# Patient Record
Sex: Male | Born: 1935 | Race: Black or African American | Hispanic: No | Marital: Single | Smoking: Former smoker
Health system: Southern US, Community
[De-identification: ages and names within clinical notes are randomized; demographics above are authoritative.]

## PROBLEM LIST (undated history)

## (undated) DIAGNOSIS — I119 Hypertensive heart disease without heart failure: Secondary | ICD-10-CM

## (undated) DIAGNOSIS — N182 Chronic kidney disease, stage 2 (mild): Secondary | ICD-10-CM

## (undated) DIAGNOSIS — E785 Hyperlipidemia, unspecified: Secondary | ICD-10-CM

## (undated) DIAGNOSIS — N390 Urinary tract infection, site not specified: Secondary | ICD-10-CM

## (undated) DIAGNOSIS — R9431 Abnormal electrocardiogram [ECG] [EKG]: Secondary | ICD-10-CM

## (undated) DIAGNOSIS — I10 Essential (primary) hypertension: Secondary | ICD-10-CM

## (undated) DIAGNOSIS — R000 Tachycardia, unspecified: Secondary | ICD-10-CM

## (undated) HISTORY — DX: Hyperlipidemia, unspecified: E78.5

## (undated) HISTORY — PX: CATARACT EXTRACTION: SUR2

## (undated) HISTORY — DX: Abnormal electrocardiogram (ECG) (EKG): R94.31

## (undated) HISTORY — DX: Hypertensive heart disease without heart failure: I11.9

## (undated) HISTORY — DX: Tachycardia, unspecified: R00.0

## (undated) HISTORY — DX: Chronic kidney disease, stage 2 (mild): N18.2

## (undated) HISTORY — DX: Essential (primary) hypertension: I10

---

## 2000-06-05 ENCOUNTER — Emergency Department (HOSPITAL_COMMUNITY): Admission: EM | Admit: 2000-06-05 | Discharge: 2000-06-05 | Payer: Self-pay | Admitting: Emergency Medicine

## 2003-12-30 ENCOUNTER — Ambulatory Visit: Payer: Self-pay | Admitting: Family Medicine

## 2004-03-20 ENCOUNTER — Ambulatory Visit: Payer: Self-pay | Admitting: Family Medicine

## 2004-11-06 ENCOUNTER — Ambulatory Visit: Payer: Self-pay | Admitting: Family Medicine

## 2004-11-07 ENCOUNTER — Encounter (INDEPENDENT_AMBULATORY_CARE_PROVIDER_SITE_OTHER): Payer: Self-pay | Admitting: Family Medicine

## 2004-11-07 LAB — CONVERTED CEMR LAB: TSH: 1.884 microintl units/mL

## 2005-05-25 ENCOUNTER — Ambulatory Visit: Payer: Self-pay | Admitting: Family Medicine

## 2005-06-17 ENCOUNTER — Ambulatory Visit: Payer: Self-pay | Admitting: Family Medicine

## 2006-05-02 ENCOUNTER — Ambulatory Visit: Payer: Self-pay | Admitting: Family Medicine

## 2007-01-24 ENCOUNTER — Encounter (INDEPENDENT_AMBULATORY_CARE_PROVIDER_SITE_OTHER): Payer: Self-pay | Admitting: Family Medicine

## 2007-01-24 DIAGNOSIS — K5900 Constipation, unspecified: Secondary | ICD-10-CM | POA: Insufficient documentation

## 2007-01-24 DIAGNOSIS — H269 Unspecified cataract: Secondary | ICD-10-CM

## 2007-01-24 DIAGNOSIS — F17200 Nicotine dependence, unspecified, uncomplicated: Secondary | ICD-10-CM

## 2007-01-24 DIAGNOSIS — I10 Essential (primary) hypertension: Secondary | ICD-10-CM | POA: Insufficient documentation

## 2007-01-24 DIAGNOSIS — H409 Unspecified glaucoma: Secondary | ICD-10-CM | POA: Insufficient documentation

## 2008-04-19 ENCOUNTER — Telehealth: Payer: Self-pay | Admitting: Internal Medicine

## 2008-04-24 ENCOUNTER — Encounter: Payer: Self-pay | Admitting: Internal Medicine

## 2008-04-29 ENCOUNTER — Telehealth (INDEPENDENT_AMBULATORY_CARE_PROVIDER_SITE_OTHER): Payer: Self-pay | Admitting: *Deleted

## 2009-07-29 ENCOUNTER — Observation Stay (HOSPITAL_COMMUNITY): Admission: EM | Admit: 2009-07-29 | Discharge: 2009-08-02 | Payer: Self-pay | Admitting: Emergency Medicine

## 2010-01-21 ENCOUNTER — Encounter: Payer: Self-pay | Admitting: Family Medicine

## 2010-01-21 ENCOUNTER — Encounter (INDEPENDENT_AMBULATORY_CARE_PROVIDER_SITE_OTHER): Payer: Self-pay | Admitting: *Deleted

## 2010-01-21 LAB — CONVERTED CEMR LAB
ALT: 10 units/L
Albumin: 3.9 g/dL
Alkaline Phosphatase: 70 units/L
Calcium: 9.7 mg/dL
Chloride, Serum: 104 mmol/L
Cholesterol: 145 mg/dL
Creatinine, Ser: 0.9 mg/dL
Free T4: 0.71 ng/dL
Globulin: 2.9 g/dL
Glucose, Bld: 194 mg/dL
Hgb A1c MFr Bld: 8.9 %
Platelets: 200 10*3/uL
Potassium, serum: 3.8 mmol/L
Sodium, serum: 138 mmol/L
Total Bilirubin: 0.2 mg/dL
Total Protein: 6.8 g/dL
Triglycerides: 52 mg/dL

## 2010-02-04 ENCOUNTER — Encounter (INDEPENDENT_AMBULATORY_CARE_PROVIDER_SITE_OTHER): Payer: Self-pay | Admitting: *Deleted

## 2010-02-04 ENCOUNTER — Encounter: Payer: Self-pay | Admitting: Family Medicine

## 2010-02-10 ENCOUNTER — Ambulatory Visit: Payer: Self-pay | Admitting: Family Medicine

## 2010-02-10 DIAGNOSIS — E785 Hyperlipidemia, unspecified: Secondary | ICD-10-CM

## 2010-02-10 DIAGNOSIS — E1169 Type 2 diabetes mellitus with other specified complication: Secondary | ICD-10-CM

## 2010-02-18 ENCOUNTER — Encounter (INDEPENDENT_AMBULATORY_CARE_PROVIDER_SITE_OTHER): Payer: Self-pay | Admitting: *Deleted

## 2010-03-03 ENCOUNTER — Telehealth (INDEPENDENT_AMBULATORY_CARE_PROVIDER_SITE_OTHER): Payer: Self-pay | Admitting: *Deleted

## 2010-04-21 NOTE — Assessment & Plan Note (Signed)
Summary: NEW EST   Vital Signs:  Patient profile:   77 year old male Height:      68 inches Weight:      206 pounds BMI:     31.44 Pulse rate:   92 / minute BP sitting:   134 / 76  (left arm)  Vitals Entered By: Malachi Bonds CMA (February 10, 2010 8:12 AM) CC: new est- fasting labs   History of Present Illness: 77 yo man here today to establish care.  Previous MD- Altheimer, had CPE earlier this year (per pt).  DM- taking Metformin, humalog 30 units bid.  pt reports he had A1C done last week, unsure of value.  CBGs labile- 90-110 fasting, 100-200s.  last eye exam was 1 year (Dr Gershon Crane).  will have occasional symptomatic lows- dizziness, shaky.  will sweat and get confused when sugars are too high.  no CP, SOB, HAs, visual changes, edema  HTN- taking lisinopril and nifedical.  asymptomatic.  has never had stress test.  Hyperlipidemia- pt was previously on meds but reports previous MD stopped these, unsure why.  R cataract- following w/ Dr Gershon Crane  Health Maintainence- reports colonoscopy at [facility] (? flex sig)  Preventive Screening-Counseling & Management  Alcohol-Tobacco     Alcohol drinks/day: 0     Smoking Status: current     Smoking Cessation Counseling: yes     Smoke Cessation Stage: precontemplative     Packs/Day: 1.0  Caffeine-Diet-Exercise     Does Patient Exercise: yes     Type of exercise: walking      Sexual History:  widowed 2011.        Drug Use:  never.    Current Medications (verified): 1)  Lisinopril 20 Mg Tabs (Lisinopril) .... Take One Tablet Daily 2)  Nifedical Xl 30 Mg Xr24h-Tab (Nifedipine) .... Take One Tablet Daily 3)  Metformin Hcl 1000 Mg Tabs (Metformin Hcl) .... Take One Tablet Two Times A Day  Allergies (verified): No Known Drug Allergies  Past History:  Past Medical History: Diabetes mellitus, type II Hypertension  Past Surgical History: none  Family History: Family History Diabetes 1st degree relative Family History  Hypertension Colon Cancer- none Prostate Cancer- none  Social History: lives w/ daughter other daughter lives locally widowed 2011 retired from [employer] Status:  current Packs/Day:  1.0 Does Patient Exercise:  yes Drug Use:  never Sexual History:  widowed 2011  Review of Systems      See HPI  Physical Exam  General:  Well-developed,well-nourished,in no acute distress; alert,appropriate and cooperative throughout examination.  smells of tobacco, overwt Head:  Normocephalic and atraumatic without obvious abnormalities Eyes:  R cataract, L PERRL EOMI Mouth:  mostly edentulous w/ exception of lower front teeth that are broken and decaying (pt reports there is a plan in place to have these pulled) Neck:  No deformities, masses, or tenderness noted. Lungs:  Normal respiratory effort, chest expands symmetrically. Lungs are clear to auscultation, no crackles or wheezes. Heart:  Normal rate and regular rhythm. S1 and S2 normal without gallop, murmur, click, rub or other extra sounds. Abdomen:  soft, NT/ND, +BS Pulses:  +2 carotid, radial, DP Extremities:  no C/C/E Skin:  turgor normal and color normal.   Cervical Nodes:  No lymphadenopathy noted Psych:  pleasant and cooperative, some difficulty w/ memory   Impression & Recommendations:  Problem # 1:  DIABETES MELLITUS, TYPE II (ICD-250.00) pt reports he had A1C done last week w/ Dr Altheimer.  will get  records.  pt on Metformin and Humalog 75/25 flex pen.  samples given of Humalog due to cost and fact pt is in donut.  will determine f/u and any med changes once records available for review. The following medications were removed from the medication list:    Enalapril Maleate 5 Mg Tabs (Enalapril maleate) ..... One qam    Starlix 120 Mg Tabs (Nateglinide) ..... One tab ac    Glucovance 5-500 Mg Tabs (Glyburide-metformin) ..... One two times a day His updated medication list for this problem includes:    Lisinopril 20 Mg  Tabs (Lisinopril) .Marland Kitchen... Take one tablet daily    Metformin Hcl 1000 Mg Tabs (Metformin hcl) .Marland Kitchen... Take one tablet two times a day  Problem # 2:  HYPERTENSION (ICD-401.9) adequate control today.  asymptomatic.  no changes at this time. The following medications were removed from the medication list:    Enalapril Maleate 5 Mg Tabs (Enalapril maleate) ..... One qam His updated medication list for this problem includes:    Lisinopril 20 Mg Tabs (Lisinopril) .Marland Kitchen... Take one tablet daily    Nifedical Xl 30 Mg Xr24h-tab (Nifedipine) .Marland Kitchen... Take one tablet daily  Problem # 3:  HYPERLIPIDEMIA (ICD-272.4) Assessment: New pt report being on Crestor previously but this was stopped.  pt unclear why.  will await records  Problem # 4:  TOBACCO ABUSE (ICD-305.1) strongly encouraged pt to quit.  will follow.  Problem # 5:  CATARACTS (ICD-366.9) following w/ Dr Gershon Crane.  Complete Medication List: 1)  Lisinopril 20 Mg Tabs (Lisinopril) .... Take one tablet daily 2)  Nifedical Xl 30 Mg Xr24h-tab (Nifedipine) .... Take one tablet daily 3)  Metformin Hcl 1000 Mg Tabs (Metformin hcl) .... Take one tablet two times a day  Patient Instructions: 1)  Once we have your records from Dr Altheimer we'll call you and set up your next appt 2)  Try and make healthy food choices and walk regularly 3)  Call with any questions or concerns 4)  Welcome!  We're glad to have you!   Orders Added: 1)  New Patient Level III [99203]     Preventive Care Screening  Colonoscopy:    Date:  03/23/2007    Results:  normal    Appended Document:  please let him know that we received his records and based on his endocrinologist report he should continue to follow with him for his Diabetes.  This appears hard to control and he is an expert in this area.  we will need to see him for a CPE in January.  please have him schedule this.

## 2010-04-21 NOTE — Letter (Signed)
Summary: [facility] Endocrinology [facility] Diabetes  [facility] Endocrinology & Diabetes   Imported By: Edmonia James 02/25/2010 14:08:44  _____________________________________________________________________  External Attachment:    Type:   Image     Comment:   External Document

## 2010-04-21 NOTE — Letter (Signed)
Summary: [facility] Endocrinology & Diabetes  [facility] Endocrinology & Diabetes   Imported By: Edmonia James 02/25/2010 14:09:58  _____________________________________________________________________  External Attachment:    Type:   Image     Comment:   External Document

## 2010-04-23 NOTE — Progress Notes (Signed)
Summary: cpx for January-lmom  Phone Note Outgoing Call   Call placed by: Malachi Bonds CMA,  March 03, 2010 3:23 PM Call placed to: Patient Summary of Call: please let him know that we received his records and based on his endocrinologist report he should continue to follow with him for his Diabetes.  This appears hard to control and he is an expert in this area.  we will need to see him for a CPE in January.  please have him schedule this.    Follow-up for Phone Call        left message on machine .........Marland KitchenMalachi Bonds CMA  March 03, 2010 3:23 PM

## 2010-04-23 NOTE — Miscellaneous (Signed)
  Clinical Lists Changes  Medications: Added new medication of NOVOLIN 70/30 70-30 % SUSP (INSULIN ISOPHANE & REGULAR) 30 units before meals and 30 units after meals Added new medication of HUMALOG MIX 75/25 75-25 % SUSP (INSULIN LISPRO PROT & LISPRO) 90 units before meals and 80 units after meals Observations: Added new observation of VIT D 25-OH: 30.2 ng/mL (02/04/2010 11:41) Added new observation of TSH: 2.66 microintl units/mL (01/21/2010 11:41) Added new observation of T4, FREE: 0.71 ng/dL (01/21/2010 11:41) Added new observation of TRIGLYC TOT: 52 mg/dL (01/21/2010 11:41) Added new observation of LDL: 87 mg/dL (01/21/2010 11:41) Added new observation of HDL: 48 mg/dL (01/21/2010 11:41) Added new observation of CHOLESTEROL: 145 mg/dL (01/21/2010 11:41) Added new observation of BILI TOTAL: 0.2 mg/dL (01/21/2010 11:41) Added new observation of ALK PHOS: 70 units/L (01/21/2010 11:41) Added new observation of SGPT (ALT): 10 units/L (01/21/2010 11:41) Added new observation of SGOT (AST): 13 units/L (01/21/2010 11:41) Added new observation of PROTEIN, TOT: 6.8 g/dL (01/21/2010 11:41) Added new observation of GLOBULIN TOT: 2.9 g/dL (01/21/2010 11:41) Added new observation of ALBUMIN: 3.9 g/dL (01/21/2010 11:41) Added new observation of CALCIUM: 9.7 mg/dL (01/21/2010 11:41) Added new observation of HGBA1C: 8.9 % (01/21/2010 11:41) Added new observation of GLUCOSE SER: 194 mg/dL (01/21/2010 11:41) Added new observation of CREATININE: 0.9 mg/dL (01/21/2010 11:41) Added new observation of BUN: 12 mg/dL (01/21/2010 11:41) Added new observation of CO2 TOTAL: 26 mmol/L (01/21/2010 11:41) Added new observation of CHLORIDE: 104 mmol/L (01/21/2010 11:41) Added new observation of POTASSIUM: 3.8 mmol/L (01/21/2010 11:41) Added new observation of SODIUM: 138 mmol/L (01/21/2010 11:41) Added new observation of PLATELETS: 200 10*3/mm3 (01/21/2010 11:41) Added new observation of MCH: 29.1 pg (01/21/2010  11:41) Added new observation of MCV: 90.3 fL (01/21/2010 11:41) Added new observation of HCT: 41.5 % (01/21/2010 11:41) Added new observation of HGB: 13.4 g/dL (01/21/2010 11:41) Added new observation of RBC: 4.60 10*6/mm3 (01/21/2010 11:41) Added new observation of WBC: 6.0 10*3/mm3 (01/21/2010 11:41)  Appended Document:  please let him know that we received his records and based on his endocrinologist report he should continue to follow with him for his Diabetes.  This appears hard to control and he is an expert in this area.  we will need to see him for a CPE in January.  please have him schedule this.  Appended Document:  see phone note

## 2010-06-09 LAB — GLUCOSE, CAPILLARY
Glucose-Capillary: 103 mg/dL — ABNORMAL HIGH (ref 70–99)
Glucose-Capillary: 113 mg/dL — ABNORMAL HIGH (ref 70–99)
Glucose-Capillary: 120 mg/dL — ABNORMAL HIGH (ref 70–99)
Glucose-Capillary: 123 mg/dL — ABNORMAL HIGH (ref 70–99)
Glucose-Capillary: 126 mg/dL — ABNORMAL HIGH (ref 70–99)
Glucose-Capillary: 127 mg/dL — ABNORMAL HIGH (ref 70–99)
Glucose-Capillary: 148 mg/dL — ABNORMAL HIGH (ref 70–99)
Glucose-Capillary: 149 mg/dL — ABNORMAL HIGH (ref 70–99)
Glucose-Capillary: 161 mg/dL — ABNORMAL HIGH (ref 70–99)
Glucose-Capillary: 162 mg/dL — ABNORMAL HIGH (ref 70–99)
Glucose-Capillary: 171 mg/dL — ABNORMAL HIGH (ref 70–99)
Glucose-Capillary: 171 mg/dL — ABNORMAL HIGH (ref 70–99)
Glucose-Capillary: 181 mg/dL — ABNORMAL HIGH (ref 70–99)
Glucose-Capillary: 213 mg/dL — ABNORMAL HIGH (ref 70–99)
Glucose-Capillary: 38 mg/dL — CL (ref 70–99)
Glucose-Capillary: 38 mg/dL — CL (ref 70–99)
Glucose-Capillary: 44 mg/dL — CL (ref 70–99)
Glucose-Capillary: 54 mg/dL — ABNORMAL LOW (ref 70–99)
Glucose-Capillary: 56 mg/dL — ABNORMAL LOW (ref 70–99)
Glucose-Capillary: 67 mg/dL — ABNORMAL LOW (ref 70–99)
Glucose-Capillary: 72 mg/dL (ref 70–99)
Glucose-Capillary: 76 mg/dL (ref 70–99)
Glucose-Capillary: 78 mg/dL (ref 70–99)
Glucose-Capillary: 84 mg/dL (ref 70–99)
Glucose-Capillary: 89 mg/dL (ref 70–99)

## 2010-06-09 LAB — URINALYSIS, ROUTINE W REFLEX MICROSCOPIC
Glucose, UA: 500 mg/dL — AB
Ketones, ur: 15 mg/dL — AB
Leukocytes, UA: NEGATIVE
Nitrite: NEGATIVE
Protein, ur: >300 mg/dL — AB
Specific Gravity, Urine: 1.04 — ABNORMAL HIGH (ref 1.005–1.030)
Urobilinogen, UA: 1 mg/dL (ref 0.0–1.0)
pH: 5 (ref 5.0–8.0)

## 2010-06-09 LAB — BASIC METABOLIC PANEL
BUN: 12 mg/dL (ref 6–23)
BUN: 9 mg/dL (ref 6–23)
CO2: 25 mEq/L (ref 19–32)
CO2: 25 mEq/L (ref 19–32)
Calcium: 8.6 mg/dL (ref 8.4–10.5)
Calcium: 8.8 mg/dL (ref 8.4–10.5)
Calcium: 8.9 mg/dL (ref 8.4–10.5)
Chloride: 106 mEq/L (ref 96–112)
Chloride: 108 mEq/L (ref 96–112)
Creatinine, Ser: 1.02 mg/dL (ref 0.4–1.5)
Creatinine, Ser: 1.03 mg/dL (ref 0.4–1.5)
Creatinine, Ser: 1.09 mg/dL (ref 0.4–1.5)
GFR calc Af Amer: >60 mL/min (ref >60)
GFR calc non Af Amer: >60 mL/min (ref >60)
GFR calc non Af Amer: >60 mL/min (ref >60)
Glucose, Bld: 257 mg/dL — ABNORMAL HIGH (ref 70–99)
Potassium: 3.5 mEq/L (ref 3.5–5.1)
Potassium: 4.9 mEq/L (ref 3.5–5.1)
Sodium: 140 mEq/L (ref 135–145)

## 2010-06-09 LAB — DIFFERENTIAL
Basophils Absolute: 0 10*3/uL (ref 0.0–0.1)
Basophils Relative: 0 % (ref 0–1)
Eosinophils Absolute: 0 10*3/uL (ref 0.0–0.7)
Eosinophils Relative: 0 % (ref 0–5)
Lymphocytes Relative: 8 % — ABNORMAL LOW (ref 12–46)
Lymphs Abs: 1 10*3/uL (ref 0.7–4.0)
Monocytes Absolute: 0.6 10*3/uL (ref 0.1–1.0)
Monocytes Relative: 5 % (ref 3–12)
Neutro Abs: 11 10*3/uL — ABNORMAL HIGH (ref 1.7–7.7)
Neutrophils Relative %: 87 % — ABNORMAL HIGH (ref 43–77)

## 2010-06-09 LAB — URINE MICROSCOPIC-ADD ON

## 2010-06-09 LAB — HEMOGLOBIN A1C
Hgb A1c MFr Bld: 9.6 % — ABNORMAL HIGH
Mean Plasma Glucose: 229 mg/dL — ABNORMAL HIGH

## 2010-06-09 LAB — CK
Total CK: 1242 U/L — ABNORMAL HIGH (ref 7–232)
Total CK: 4618 U/L — ABNORMAL HIGH (ref 7–232)

## 2010-06-09 LAB — CK TOTAL AND CKMB (NOT AT ARMC)
CK, MB: 71.3 ng/mL (ref 0.3–4.0)
Relative Index: 1.4 (ref 0.0–2.5)
Relative Index: 1.6 (ref 0.0–2.5)
Relative Index: 1.6 (ref 0.0–2.5)
Total CK: 1395 U/L — ABNORMAL HIGH (ref 7–232)
Total CK: 4366 U/L — ABNORMAL HIGH (ref 7–232)

## 2010-06-09 LAB — BASIC METABOLIC PANEL WITH GFR
BUN: 6 mg/dL (ref 6–23)
BUN: 7 mg/dL (ref 6–23)
CO2: 24 meq/L (ref 19–32)
CO2: 28 meq/L (ref 19–32)
Calcium: 8.8 mg/dL (ref 8.4–10.5)
Calcium: 9.1 mg/dL (ref 8.4–10.5)
Chloride: 107 meq/L (ref 96–112)
Chloride: 113 meq/L — ABNORMAL HIGH (ref 96–112)
Creatinine, Ser: 0.97 mg/dL (ref 0.4–1.5)
Creatinine, Ser: 1.1 mg/dL (ref 0.4–1.5)
GFR calc non Af Amer: >60 mL/min
GFR calc non Af Amer: >60 mL/min
Glucose, Bld: 132 mg/dL — ABNORMAL HIGH (ref 70–99)
Glucose, Bld: 42 mg/dL — CL (ref 70–99)
Potassium: 3.4 meq/L — ABNORMAL LOW (ref 3.5–5.1)
Potassium: 3.7 meq/L (ref 3.5–5.1)
Sodium: 141 meq/L (ref 135–145)
Sodium: 143 meq/L (ref 135–145)

## 2010-06-09 LAB — RAPID URINE DRUG SCREEN, HOSP PERFORMED
Amphetamines: NOT DETECTED
Barbiturates: NOT DETECTED
Benzodiazepines: NOT DETECTED
Cocaine: NOT DETECTED
Opiates: NOT DETECTED
Tetrahydrocannabinol: NOT DETECTED

## 2010-06-09 LAB — CBC
HCT: 34.6 % — ABNORMAL LOW (ref 39.0–52.0)
MCHC: 34.1 g/dL (ref 30.0–36.0)
Platelets: 172 10*3/uL (ref 150–400)
WBC: 11.9 10*3/uL — ABNORMAL HIGH (ref 4.0–10.5)

## 2010-06-09 LAB — POCT CARDIAC MARKERS
CKMB, poc: 13.8 ng/mL (ref 1.0–8.0)
Myoglobin, poc: >500 ng/mL (ref 12–200)
Troponin i, poc: <0.05 ng/mL (ref 0.00–0.09)

## 2010-06-09 LAB — HEPATIC FUNCTION PANEL
ALT: 28 U/L (ref 0–53)
AST: 67 U/L — ABNORMAL HIGH (ref 0–37)
Total Protein: 6.9 g/dL (ref 6.0–8.3)

## 2010-06-09 LAB — TROPONIN I: Troponin I: 0.01 ng/mL (ref 0.00–0.06)

## 2010-06-25 ENCOUNTER — Telehealth: Payer: Self-pay | Admitting: *Deleted

## 2010-06-25 ENCOUNTER — Encounter: Payer: Self-pay | Admitting: *Deleted

## 2010-06-25 MED ORDER — INSULIN NPH ISOPHANE & REGULAR (70-30) 100 UNIT/ML ~~LOC~~ SUSP: SUBCUTANEOUS | Status: DC

## 2010-06-25 NOTE — Telephone Encounter (Signed)
Left message to call office, Rx sent to pharmacy and letter mailed advising Pt to contact office.

## 2010-06-25 NOTE — Telephone Encounter (Signed)
We can give pt 1 month of meds but he needs to get his diabetic care from his endocrinologist.  He also never scheduled his physical exam.  Please let him know that he needs to make both appts.

## 2010-06-25 NOTE — Telephone Encounter (Signed)
Last OV 02-10-10 new to establish, per phone note dated 03-03-10 per endocrinologist report he should continue to follow with him for his Diabetes.  This appears hard to control and he is an expert in this area.  we will need to see him for a CPE in January.  Pt has no pending appt.

## 2010-06-29 NOTE — Telephone Encounter (Signed)
Left message to call office

## 2010-06-30 NOTE — Telephone Encounter (Signed)
Left message to call office with child.

## 2010-07-01 NOTE — Telephone Encounter (Signed)
Pt aware.

## 2010-10-13 ENCOUNTER — Telehealth: Payer: Self-pay | Admitting: *Deleted

## 2010-10-13 NOTE — Telephone Encounter (Signed)
MD received paperwork from [employer] for DM supplies. It was completed and faxed with notation the pt needs DM follow up appt. Called pt to schedule and left message with family to call the office.

## 2010-10-16 NOTE — Telephone Encounter (Signed)
No return call from pt. I called and pt wife put him on the phone and pt would not respond after I introduced myself. Mailed letter per MD.

## 2010-12-15 ENCOUNTER — Telehealth: Payer: Self-pay | Admitting: *Deleted

## 2010-12-15 NOTE — Telephone Encounter (Signed)
Will not complete any forms until pt is seen.  He has had multiple phone calls and letters indicating he needs to be seen.  Please try and contact pt again- if we are again unable to reach him or he refuses to schedule will begin the dismissal process.

## 2010-12-15 NOTE — Telephone Encounter (Signed)
Call from [place] at [facility] stating she is faxing form for pt to receive diabetic shoes.  States that she has previously faxed this form and left several messages, but no one has returned her call.  Pt has not been seen since 02/10/10 and was told by letter in July he is due for follow up.  Geni Bers states she will refax form. Please advise.

## 2010-12-15 NOTE — Telephone Encounter (Signed)
I have attempted to contact this patient by phone with the following results: message left to return my call with an older gentleman at home number.

## 2010-12-16 NOTE — Telephone Encounter (Signed)
2nd attempt to contact patient.  Phone answered by older sounding gentleman.  I asked to speak with "Marcus Weaver" and was told he would be in in about 30 more minutes.  I asked if I could leave a message for Marcus Weaver and the gentleman hung up the phone.

## 2010-12-28 ENCOUNTER — Telehealth: Payer: Self-pay

## 2010-12-28 NOTE — Telephone Encounter (Signed)
Pt aware diabetic supplies documents will not be completed until follow up appointment is scheduled.  Appointment scheduled for Thursday, December 31, 2010

## 2010-12-30 ENCOUNTER — Encounter: Payer: Self-pay | Admitting: Family Medicine

## 2010-12-30 ENCOUNTER — Telehealth: Payer: Self-pay

## 2010-12-30 NOTE — Telephone Encounter (Signed)
Abigail Butts at [facility] called to check status of pt's order for diabetic shoes

## 2010-12-31 ENCOUNTER — Ambulatory Visit (INDEPENDENT_AMBULATORY_CARE_PROVIDER_SITE_OTHER): Payer: PRIVATE HEALTH INSURANCE | Admitting: Family Medicine

## 2010-12-31 ENCOUNTER — Encounter: Payer: Self-pay | Admitting: Family Medicine

## 2010-12-31 DIAGNOSIS — E785 Hyperlipidemia, unspecified: Secondary | ICD-10-CM

## 2010-12-31 DIAGNOSIS — I10 Essential (primary) hypertension: Secondary | ICD-10-CM

## 2010-12-31 DIAGNOSIS — E119 Type 2 diabetes mellitus without complications: Secondary | ICD-10-CM

## 2010-12-31 LAB — LIPID PANEL
HDL: 45.2 mg/dL (ref >39.00)
Total CHOL/HDL Ratio: 3

## 2010-12-31 LAB — HEMOGLOBIN A1C: Hgb A1c MFr Bld: 9.8 % — ABNORMAL HIGH (ref 4.6–6.5)

## 2010-12-31 LAB — CBC WITH DIFFERENTIAL/PLATELET
Basophils Relative: 0.3 % (ref 0.0–3.0)
Eosinophils Relative: 2.9 % (ref 0.0–5.0)
Lymphocytes Relative: 32.2 % (ref 12.0–46.0)
MCV: 91.6 fl (ref 78.0–100.0)
Monocytes Relative: 8.6 % (ref 3.0–12.0)
Neutrophils Relative %: 56 % (ref 43.0–77.0)
Platelets: 177 10*3/uL (ref 150.0–400.0)
RBC: 4.49 Mil/uL (ref 4.22–5.81)
WBC: 7.9 10*3/uL (ref 4.5–10.5)

## 2010-12-31 LAB — BASIC METABOLIC PANEL
BUN: 18 mg/dL (ref 6–23)
CO2: 28 mEq/L (ref 19–32)
Chloride: 110 mEq/L (ref 96–112)
GFR: 80.58 mL/min (ref >60.00)
Glucose, Bld: 64 mg/dL — ABNORMAL LOW (ref 70–99)
Potassium: 4.1 mEq/L (ref 3.5–5.1)
Sodium: 144 mEq/L (ref 135–145)

## 2010-12-31 LAB — HEPATIC FUNCTION PANEL
ALT: 10 U/L (ref 0–53)
AST: 13 U/L (ref 0–37)
Albumin: 3.9 g/dL (ref 3.5–5.2)
Alkaline Phosphatase: 65 U/L (ref 39–117)

## 2010-12-31 LAB — TSH: TSH: 1.42 u[IU]/mL (ref 0.35–5.50)

## 2010-12-31 MED ORDER — INSULIN LISPRO PROT & LISPRO (75-25 MIX) 100 UNIT/ML ~~LOC~~ SUSP: 40.0000 [IU] | Freq: Two times a day (BID) | SUBCUTANEOUS | Status: DC

## 2010-12-31 NOTE — Progress Notes (Signed)
Quick Note:  Pt unavailable and unable to leave message ______

## 2010-12-31 NOTE — Telephone Encounter (Signed)
Pt came for appt today.

## 2010-12-31 NOTE — Progress Notes (Signed)
  Subjective:    Patient ID: Marcus Weaver, male    DOB: [birth year], 77 y.o.   MRN: [id number]  HPI DM- chronic problem for pt, has not been seen since Nov of last year.  Reports CBGs are labile, ranging from 90s-190s.  Taking Metformin 1000mg  bid.  Taking Humalog bid- 30 units.  Denies recent symptomatic lows.  No CP, SOB, HAs, edema.  UTD on eye exam.  Has upcoming cataract surgery on 10/29.  HTN- chronic problem, on Lisinopril, procardia.  Asymptomatic.  Hyperlipidemia- chronic problem for pt, was previously on Crestor but ran out of meds and never got these refilled.  Review of Systems For ROS see HPI     Objective:   Physical Exam  Vitals reviewed. Constitutional: He is oriented to person, place, and time. He appears well-developed and well-nourished. No distress.  HENT:  Head: Normocephalic and atraumatic.  Eyes: Conjunctivae and EOM are normal. Pupils are equal, round, and reactive to light.  Neck: Normal range of motion. Neck supple. No thyromegaly present.  Cardiovascular: Normal rate, regular rhythm, normal heart sounds and intact distal pulses.   No murmur heard. Pulmonary/Chest: Effort normal and breath sounds normal. No respiratory distress.  Abdominal: Soft. Bowel sounds are normal. He exhibits no distension.  Musculoskeletal: He exhibits no edema.  Lymphadenopathy:    He has no cervical adenopathy.  Neurological: He is alert and oriented to person, place, and time. No cranial nerve deficit.  Skin: Skin is warm and dry.  Psychiatric: He has a normal mood and affect. His behavior is normal.         Assessment & Plan:

## 2010-12-31 NOTE — Patient Instructions (Signed)
Follow up in 3 months to recheck your diabetes We'll notify you of your lab results and make any med changes if needed [facility] will contact you about your shoes Call with any questions or concerns Hang in there!

## 2011-01-04 ENCOUNTER — Telehealth: Payer: Self-pay | Admitting: *Deleted

## 2011-01-04 NOTE — Telephone Encounter (Signed)
Paperwork faxed °

## 2011-01-04 NOTE — Progress Notes (Signed)
Quick Note:  Left message for patient to call back. ______

## 2011-01-04 NOTE — Telephone Encounter (Signed)
Prior Auth approved 01-04-11 until 01-03-14, Pharmacy notified via fax.

## 2011-01-04 NOTE — Progress Notes (Signed)
Quick Note:  Attempted to reach pt to no avail. Will mail labs and try to again later ______

## 2011-01-05 NOTE — Assessment & Plan Note (Signed)
Pt has been very difficult to get in touch with and has not follow up as recommended.  He and daughter are confused by his regimen.  Fear that his diabetes is not well controlled.  UTD on eye exam, had foot exam done today.  Check labs.  Adjust meds prn.

## 2011-01-05 NOTE — Assessment & Plan Note (Signed)
Chronic problem.  Adequate control today.  Asymptomatic.  No med changes at this time 

## 2011-01-05 NOTE — Assessment & Plan Note (Signed)
Chronic problem for pt.  LDL goal is 70 due to DM.  Was previously on statin.  Stopped b/c he ran out of meds.  Check labs and restart meds.

## 2011-02-16 ENCOUNTER — Other Ambulatory Visit: Payer: Self-pay | Admitting: Family Medicine

## 2011-02-16 MED ORDER — NIFEDIPINE ER 30 MG PO TB24: 30.0000 mg | ORAL_TABLET | Freq: Every day | ORAL | Status: DC

## 2011-02-16 MED ORDER — LISINOPRIL 20 MG PO TABS: 20.0000 mg | ORAL_TABLET | Freq: Every day | ORAL | Status: DC

## 2011-02-16 NOTE — Telephone Encounter (Signed)
rx sent to pharmacy by e-script  

## 2011-06-23 ENCOUNTER — Other Ambulatory Visit: Payer: Self-pay | Admitting: Family Medicine

## 2011-06-23 NOTE — Telephone Encounter (Signed)
Refill for 90-day  1-lisinopril 20mg  tablets, take 1-tablet by mouth daily Last written 11.27.13  2-Nifediac CC 30MG  ER Tablets, Take 1-tablet by mouth daily  Last written 11.27.13

## 2011-06-24 MED ORDER — LISINOPRIL 20 MG PO TABS: 20.0000 mg | ORAL_TABLET | Freq: Every day | ORAL | Status: DC

## 2011-06-24 MED ORDER — NIFEDIPINE ER 30 MG PO TB24: 30.0000 mg | ORAL_TABLET | Freq: Every day | ORAL | Status: DC

## 2011-06-24 NOTE — Telephone Encounter (Signed)
rx sent to pharmacy by e-script for #30 with 1 refill Letter has been mailed to pt address noted in the chart to advise they are overdue for cpe/ov/labs and the pt needs to contact office to set up appt   

## 2011-10-25 ENCOUNTER — Telehealth: Payer: Self-pay | Admitting: Family Medicine

## 2011-10-25 MED ORDER — LISINOPRIL 20 MG PO TABS: 20.0000 mg | ORAL_TABLET | Freq: Every day | ORAL | Status: DC

## 2011-10-25 MED ORDER — NIFEDIPINE ER 30 MG PO TB24: 30.0000 mg | ORAL_TABLET | Freq: Every day | ORAL | Status: DC

## 2011-10-25 NOTE — Telephone Encounter (Signed)
Noted. Agree w/ appt.

## 2011-10-25 NOTE — Telephone Encounter (Signed)
FYI: noted pt was advised on 10-12 to come back in 3 months to re-check DM and Cholesterol, pt has not scheduled apt, left vm for pt to call office to schedule F/U apt asap, sent #30 with no refills on both rx requested via escribe

## 2011-10-25 NOTE — Telephone Encounter (Signed)
Refill: Nifedipine 20mg  er tablets. Take 1 tablet by mouth daily. Qty 30. Last fill 7.3.13 Lisinopril 20mg  tablets. Take 1 tablet by mouth daily. Qty 30. Last fill 7.3.13

## 2011-11-05 ENCOUNTER — Encounter: Payer: Self-pay | Admitting: Family Medicine

## 2011-11-05 ENCOUNTER — Ambulatory Visit (INDEPENDENT_AMBULATORY_CARE_PROVIDER_SITE_OTHER): Payer: PRIVATE HEALTH INSURANCE | Admitting: Family Medicine

## 2011-11-05 VITALS — BP 140/76 | HR 90 | Temp 98.3°F | Ht 67.5 in | Wt 197.2 lb

## 2011-11-05 DIAGNOSIS — M109 Gout, unspecified: Secondary | ICD-10-CM

## 2011-11-05 DIAGNOSIS — E785 Hyperlipidemia, unspecified: Secondary | ICD-10-CM

## 2011-11-05 DIAGNOSIS — E119 Type 2 diabetes mellitus without complications: Secondary | ICD-10-CM

## 2011-11-05 DIAGNOSIS — I10 Essential (primary) hypertension: Secondary | ICD-10-CM

## 2011-11-05 DIAGNOSIS — Z0000 Encounter for general adult medical examination without abnormal findings: Secondary | ICD-10-CM

## 2011-11-05 LAB — LIPID PANEL
Cholesterol: 153 mg/dL (ref 0–200)
HDL: 41.5 mg/dL (ref >39.00)
Triglycerides: 69 mg/dL (ref 0.0–149.0)
VLDL: 13.8 mg/dL (ref 0.0–40.0)

## 2011-11-05 LAB — CBC WITH DIFFERENTIAL/PLATELET
Basophils Absolute: 0 10*3/uL (ref 0.0–0.1)
Eosinophils Relative: 1.9 % (ref 0.0–5.0)
HCT: 43.2 % (ref 39.0–52.0)
Hemoglobin: 14.1 g/dL (ref 13.0–17.0)
Lymphocytes Relative: 18.8 % (ref 12.0–46.0)
Monocytes Relative: 6 % (ref 3.0–12.0)
Neutro Abs: 5.8 10*3/uL (ref 1.4–7.7)
Platelets: 175 10*3/uL (ref 150.0–400.0)
RDW: 13.4 % (ref 11.5–14.6)
WBC: 8 10*3/uL (ref 4.5–10.5)

## 2011-11-05 LAB — HEPATIC FUNCTION PANEL
AST: 12 U/L (ref 0–37)
Total Bilirubin: 0.6 mg/dL (ref 0.3–1.2)

## 2011-11-05 LAB — BASIC METABOLIC PANEL
BUN: 9 mg/dL (ref 6–23)
CO2: 24 mEq/L (ref 19–32)
Calcium: 9.5 mg/dL (ref 8.4–10.5)
Chloride: 106 mEq/L (ref 96–112)
Creatinine, Ser: 1 mg/dL (ref 0.4–1.5)
GFR: 97.05 mL/min (ref >60.00)
Sodium: 140 mEq/L (ref 135–145)

## 2011-11-05 LAB — TSH: TSH: 1.69 u[IU]/mL (ref 0.35–5.50)

## 2011-11-05 NOTE — Progress Notes (Signed)
  Subjective:    Patient ID: Marcus Weaver, male    DOB: 02/25/1931, 78 y.o.   MRN: [id number]  HPI Here today for CPE.  Risk Factors: DM- chronic problem, pt was advised to f/u 9 months ago but this did not happen.  On Metformin, Humalog 40 units QAM, and 30 units QPM.  UTD on eye exams, attempting to get cataract removed.  CBGs typically 110-120s.  No neuropathy.  No CP, SOB, HAs, edema, N/V/D, dizziness.  HTN-  Chronic problem, adequate control.  On Lisinopril, Nifedipine.  Asymptomatic.  Physical Activity: walking sporadically Fall Risk: low risk Depression: denies current sxs Hearing: normal to conversational tones, decreased to whispered voice ADL's: independent Cognitive: normal linear thought process, decreased memory, good attention Home Safety: safe at home Height, Weight, BMI, Visual Acuity: see vitals, vision corrected to 20/20 w/ glasses Counseling: UTD on eye exam, UTD on colonoscopy [place]) Labs Ordered: See A&P Care Plan: See A&P    Review of Systems For ROS see HPI     Objective:   Physical Exam BP 140/76  Pulse 90  Temp 98.3 F (36.8 C) (Oral)  Ht 5' 7.5" (1.715 m)  Wt 197 lb 3.2 oz (89.449 kg)  BMI 30.43 kg/m2  SpO2 96%  General Appearance:    Alert, cooperative, no distress, appears stated age  Head:    Normocephalic, without obvious abnormality, atraumatic  Eyes:    PERRL, conjunctiva/corneas clear, EOM's intact  Ears:    Normal TM's and external ear canals, both ears  Nose:   Nares normal, septum midline, mucosa normal, no drainage   or sinus tenderness  Throat:   Lips, mucosa, and tongue normal; teeth and gums normal  Neck:   Supple, symmetrical, trachea midline, no adenopathy;       thyroid:  No enlargement/tenderness/nodules  Back:     Symmetric, no curvature, ROM normal, no CVA tenderness  Lungs:     Clear to auscultation bilaterally, respirations unlabored  Chest wall:    No tenderness or deformity  Heart:    Regular rate and rhythm, S1 and  S2 normal, no murmur, rub   or gallop  Abdomen:     Soft, non-tender, bowel sounds active all four quadrants,    no masses, no organomegaly  Genitalia:    Normal male without lesion, discharge or tenderness  Rectal:    Normal tone, normal prostate, no masses or tenderness  Extremities:   Extremities normal, atraumatic, no cyanosis or edema  Pulses:   2+ and symmetric all extremities  Skin:   Skin color, texture, turgor normal, no rashes or lesions  Lymph nodes:   Cervical, supraclavicular, and axillary nodes normal  Neurologic:   CNII-XII intact. Normal strength, sensation and reflexes      throughout          Assessment & Plan:

## 2011-11-05 NOTE — Patient Instructions (Addendum)
Follow up in 3 months to recheck diabetes We'll notify you of your lab results and make any changes if needed Call with any questions or concerns Hang in there!

## 2011-11-14 NOTE — Assessment & Plan Note (Signed)
Pt's PE WNL.  UTD on health maintenance.  Check labs.  Anticipatory guidance provided.  

## 2011-11-14 NOTE — Assessment & Plan Note (Signed)
Pt w/ hx of this.  Not currently on meds.  Check labs.  Start meds prn.

## 2011-11-14 NOTE — Assessment & Plan Note (Signed)
Chronic problem.  Adequate control.  Asymptomatic.  No changes.

## 2011-11-14 NOTE — Assessment & Plan Note (Signed)
Chronic problem.  Pt is not following up as directed and will go months w/out A1Cs or blood sugar readings.  Denies symptomatic lows, UTD on eye exam.  Foot exam done today.  Check labs.  Adjust meds as able but if A1C remains poorly controlled will need endo referral.  Pt expressed understanding and is in agreement w/ plan.

## 2011-11-24 ENCOUNTER — Telehealth: Payer: Self-pay | Admitting: Family Medicine

## 2011-11-24 NOTE — Telephone Encounter (Signed)
Just fyi.....Marland Kitchen  In reference to Endocrinology referral, patient had history with Dr. Elyse Hsu, so I referred him there.  Per phone call received yesterday, 11/23/11, from [place] at [place] [facility], they will not see this patient.  He was dismissed for being non compliant, and has an old balance that was never taken care of.  I have referred him to Dr. Loanne Drilling, and he is aware of all above.

## 2011-11-24 NOTE — Telephone Encounter (Signed)
Noted thank you

## 2011-12-06 ENCOUNTER — Ambulatory Visit: Payer: PRIVATE HEALTH INSURANCE | Admitting: Endocrinology

## 2011-12-16 ENCOUNTER — Other Ambulatory Visit: Payer: Self-pay | Admitting: Family Medicine

## 2011-12-16 ENCOUNTER — Telehealth: Payer: Self-pay | Admitting: Family Medicine

## 2011-12-16 MED ORDER — NIFEDIPINE ER 30 MG PO TB24: 30.0000 mg | ORAL_TABLET | Freq: Every day | ORAL | Status: DC

## 2011-12-16 MED ORDER — LISINOPRIL 20 MG PO TABS: 20.0000 mg | ORAL_TABLET | Freq: Every day | ORAL | Status: DC

## 2011-12-16 NOTE — Telephone Encounter (Signed)
rx sent to pharmacy by e-script  

## 2011-12-16 NOTE — Telephone Encounter (Signed)
refill Lisinopril (Tab) 20 MG Take 1 tablet (20 mg total) by mouth daily  last fill 8.5.13 #30 last ov 8.16.13 V70

## 2011-12-16 NOTE — Telephone Encounter (Signed)
Refill: Nifedipine 30mg  er (cc) tablets. Take 1 tablet by mouth daily. Qty 30. Last fill 8.5.13

## 2011-12-22 ENCOUNTER — Other Ambulatory Visit: Payer: Self-pay | Admitting: Family Medicine

## 2011-12-22 NOTE — Telephone Encounter (Signed)
recv 2nd req for Nifediopine 30mg  ER CC tabs sent on 9.26.13 can you re-send please

## 2011-12-23 MED ORDER — NIFEDIPINE ER 30 MG PO TB24: 30.0000 mg | ORAL_TABLET | Freq: Every day | ORAL | Status: DC

## 2011-12-23 NOTE — Telephone Encounter (Signed)
rx sent to pharmacy by e-script  

## 2012-02-23 ENCOUNTER — Other Ambulatory Visit: Payer: Self-pay | Admitting: Family Medicine

## 2012-02-23 DIAGNOSIS — E119 Type 2 diabetes mellitus without complications: Secondary | ICD-10-CM

## 2012-02-23 MED ORDER — INSULIN LISPRO PROT & LISPRO (75-25 MIX) 100 UNIT/ML ~~LOC~~ SUSP: 40.0000 [IU] | Freq: Two times a day (BID) | SUBCUTANEOUS | Status: DC

## 2012-02-23 NOTE — Telephone Encounter (Signed)
refill Humalog MIX 75/25 KWIKPEN INJ 5x3ML inject 40 units into the skin twice daily with a meal #15 wt/4-refills no last fill listed last ov wt/labs 8.16.13 V70

## 2012-02-24 MED ORDER — INSULIN LISPRO PROT & LISPRO (75-25 MIX) 100 UNIT/ML ~~LOC~~ SUSP: SUBCUTANEOUS | Status: DC

## 2012-02-24 MED ORDER — INSULIN PEN NEEDLE 32G X 8 MM MISC: Status: DC

## 2012-02-24 NOTE — Addendum Note (Signed)
Addended by: Valentina Gu L on: [id number] 03:50 PM   Modules accepted: Orders

## 2012-02-24 NOTE — Telephone Encounter (Signed)
Per fax received from pharmacy: Pt states that he is currently using pen, not the vials. Can we please send in new Rx for the Kwikpens if this is correct? He's also stating he needs a rx for more pen needles. Rx sent

## 2012-04-14 ENCOUNTER — Other Ambulatory Visit: Payer: Self-pay | Admitting: Family Medicine

## 2012-05-25 ENCOUNTER — Telehealth: Payer: Self-pay | Admitting: Family Medicine

## 2012-05-25 MED ORDER — LISINOPRIL 20 MG PO TABS: ORAL_TABLET | ORAL | Status: DC

## 2012-05-25 NOTE — Telephone Encounter (Signed)
refill Lisinopril (Tab) 20 MG TAKE 1 TABLET BY MOUTH DAILY #30 last fill 1.24.14

## 2012-05-25 NOTE — Telephone Encounter (Signed)
Refill done.  

## 2012-06-15 ENCOUNTER — Telehealth: Payer: Self-pay | Admitting: Family Medicine

## 2012-06-15 NOTE — Telephone Encounter (Signed)
REFILLS NEEDED ON NIFEDICAL #30  [id number] 1 TABLET BY MOUTH DAILY LAST FILLED :02.25.2014

## 2012-06-16 MED ORDER — NIFEDIPINE ER 30 MG PO TB24: 30.0000 mg | ORAL_TABLET | Freq: Every day | ORAL | Status: DC

## 2012-06-16 NOTE — Telephone Encounter (Signed)
Rx sent to the pharmacy by e-script.//AB/CMA 

## 2013-05-20 LAB — HM DIABETES EYE EXAM

## 2013-06-01 ENCOUNTER — Encounter: Payer: Self-pay | Admitting: Family Medicine

## 2013-06-01 ENCOUNTER — Ambulatory Visit (INDEPENDENT_AMBULATORY_CARE_PROVIDER_SITE_OTHER): Payer: Commercial Managed Care - HMO | Admitting: Family Medicine

## 2013-06-01 VITALS — BP 150/90 | HR 88 | Temp 98.2°F | Resp 16 | Wt 201.2 lb

## 2013-06-01 DIAGNOSIS — S025XXA Fracture of tooth (traumatic), initial encounter for closed fracture: Secondary | ICD-10-CM

## 2013-06-01 DIAGNOSIS — Z23 Encounter for immunization: Secondary | ICD-10-CM

## 2013-06-01 DIAGNOSIS — F17200 Nicotine dependence, unspecified, uncomplicated: Secondary | ICD-10-CM

## 2013-06-01 DIAGNOSIS — E119 Type 2 diabetes mellitus without complications: Secondary | ICD-10-CM

## 2013-06-01 DIAGNOSIS — E785 Hyperlipidemia, unspecified: Secondary | ICD-10-CM

## 2013-06-01 DIAGNOSIS — I10 Essential (primary) hypertension: Secondary | ICD-10-CM

## 2013-06-01 LAB — LIPID PANEL
CHOL/HDL RATIO: 4
CHOLESTEROL: 183 mg/dL (ref 0–200)
HDL: 44.8 mg/dL (ref >39.00)
LDL CALC: 125 mg/dL — AB (ref 0–99)
Triglycerides: 66 mg/dL (ref 0.0–149.0)
VLDL: 13.2 mg/dL (ref 0.0–40.0)

## 2013-06-01 LAB — HEPATIC FUNCTION PANEL
ALBUMIN: 3.5 g/dL (ref 3.5–5.2)
ALT: 11 U/L (ref 0–53)
AST: 14 U/L (ref 0–37)
Alkaline Phosphatase: 63 U/L (ref 39–117)
BILIRUBIN TOTAL: 0.5 mg/dL (ref 0.3–1.2)
Bilirubin, Direct: 0 mg/dL (ref 0.0–0.3)
Total Protein: 6.9 g/dL (ref 6.0–8.3)

## 2013-06-01 LAB — HEMOGLOBIN A1C: Hgb A1c MFr Bld: 13.6 % — ABNORMAL HIGH (ref 4.6–6.5)

## 2013-06-01 LAB — BASIC METABOLIC PANEL
BUN: 11 mg/dL (ref 6–23)
CO2: 26 mEq/L (ref 19–32)
CREATININE: 1.1 mg/dL (ref 0.4–1.5)
Calcium: 9.2 mg/dL (ref 8.4–10.5)
Chloride: 103 mEq/L (ref 96–112)
GFR: 79.28 mL/min (ref >60.00)
Glucose, Bld: 339 mg/dL — ABNORMAL HIGH (ref 70–99)
Potassium: 4.3 mEq/L (ref 3.5–5.1)
Sodium: 136 mEq/L (ref 135–145)

## 2013-06-01 LAB — TSH: TSH: 1.26 u[IU]/mL (ref 0.35–5.50)

## 2013-06-01 MED ORDER — INSULIN PEN NEEDLE 32G X 8 MM MISC: Status: DC

## 2013-06-01 MED ORDER — METFORMIN HCL 1000 MG PO TABS: 1000.0000 mg | ORAL_TABLET | Freq: Two times a day (BID) | ORAL | Status: DC

## 2013-06-01 MED ORDER — NIFEDIPINE ER 30 MG PO TB24: 30.0000 mg | ORAL_TABLET | Freq: Every day | ORAL | Status: DC

## 2013-06-01 MED ORDER — LISINOPRIL 20 MG PO TABS: ORAL_TABLET | ORAL | Status: DC

## 2013-06-01 MED ORDER — INSULIN LISPRO PROT & LISPRO (75-25 MIX) 100 UNIT/ML ~~LOC~~ SUSP: SUBCUTANEOUS | Status: DC

## 2013-06-01 NOTE — Patient Instructions (Signed)
Follow up in 3 months to recheck sugar We'll notify you of your lab results and make any changes if needed We'll call you with your podiatry appt for the feet We'll give you names and #s of dentists Restart your Lisinopril and Nifedipine for the blood pressure Schedule your eye exam Stop smoking! Call with any questions or concerns Hang in there!!

## 2013-06-01 NOTE — Progress Notes (Signed)
Pre visit review using our clinic review tool, if applicable. No additional management support is needed unless otherwise documented below in the visit note. 

## 2013-06-01 NOTE — Assessment & Plan Note (Signed)
New.  This is a set up for infection.  Referral to dentist entered.

## 2013-06-01 NOTE — Assessment & Plan Note (Signed)
Chronic problem.  Has not followed up as directed and has been dismissed by previous Endo practice for non-compliance.  UTD on eye exam.  Refer to podiatry for routine foot care.  Check labs.  Adjust meds prn

## 2013-06-01 NOTE — Assessment & Plan Note (Signed)
Chronic problem.  Not currently on meds.  Check labs.  Start meds prn.

## 2013-06-01 NOTE — Progress Notes (Signed)
   Subjective:    Patient ID: Marcus Weaver, male    DOB: 09/12/1930, 80 y.o.   MRN: [id number]  HPI HTN- chronic problem, not currently well controlled b/c pt has not taken meds in ~1 month.  No CP, SOB.  Rare mild HAs.  No visual changes, edema.  DM- chronic problem, has been fired from Endo for non-compliance.  Has not been compliant here either.  UTD on eye exam (Summer 2014 at [facility].  No numbness/tingling hands/feet.  CBGs labile per pt report.  Taking Lispro Insulin 40 units in the AM and 30 at night.  Also on Metformin.  On ACE for renal protection.  Pt wants to see podiatry for routine diabetic care.  Hyperlipidemia- not currently on statin, goal is <100 due to DM.  Due for labs.  Review of Systems For ROS see HPI     Objective:   Physical Exam  Vitals reviewed. Constitutional: He is oriented to person, place, and time. He appears well-developed and well-nourished. No distress.  HENT:  Head: Normocephalic and atraumatic.  Multiple broken teeth  Eyes: Conjunctivae and EOM are normal. Pupils are equal, round, and reactive to light.  Neck: Normal range of motion. Neck supple. No thyromegaly present.  Cardiovascular: Normal rate, regular rhythm, normal heart sounds and intact distal pulses.   No murmur heard. Pulmonary/Chest: Effort normal and breath sounds normal. No respiratory distress.  Abdominal: Soft. Bowel sounds are normal. He exhibits no distension.  Musculoskeletal: He exhibits no edema.  Lymphadenopathy:    He has no cervical adenopathy.  Neurological: He is alert and oriented to person, place, and time. No cranial nerve deficit.  Skin: Skin is warm and dry.  Psychiatric: He has a normal mood and affect. His behavior is normal.          Assessment & Plan:

## 2013-06-01 NOTE — Assessment & Plan Note (Signed)
Chronic problem.  Deteriorated.  Pt has been out of meds x1 month.  Refilled meds.  Check labs.  Encouraged him to follow up as directed to avoid complications.

## 2013-06-01 NOTE — Assessment & Plan Note (Signed)
Not interested in quitting smoking

## 2013-06-04 ENCOUNTER — Telehealth: Payer: Self-pay | Admitting: Family Medicine

## 2013-06-04 NOTE — Telephone Encounter (Signed)
Relevant patient education mailed to patient.  

## 2013-06-05 ENCOUNTER — Encounter: Payer: Self-pay | Admitting: General Practice

## 2013-06-05 ENCOUNTER — Other Ambulatory Visit: Payer: Self-pay | Admitting: Family Medicine

## 2013-06-05 ENCOUNTER — Other Ambulatory Visit: Payer: Self-pay | Admitting: General Practice

## 2013-06-05 DIAGNOSIS — E119 Type 2 diabetes mellitus without complications: Secondary | ICD-10-CM

## 2013-06-05 DIAGNOSIS — E785 Hyperlipidemia, unspecified: Secondary | ICD-10-CM

## 2013-06-05 MED ORDER — SIMVASTATIN 20 MG PO TABS: 20.0000 mg | ORAL_TABLET | Freq: Every day | ORAL | Status: DC

## 2013-06-05 NOTE — Telephone Encounter (Signed)
Med filled.  

## 2013-06-06 ENCOUNTER — Other Ambulatory Visit: Payer: Self-pay | Admitting: Family Medicine

## 2013-06-06 DIAGNOSIS — E785 Hyperlipidemia, unspecified: Secondary | ICD-10-CM

## 2013-06-25 ENCOUNTER — Encounter: Payer: Self-pay | Admitting: Podiatry

## 2013-06-25 ENCOUNTER — Ambulatory Visit: Payer: Commercial Managed Care - HMO | Admitting: Podiatry

## 2013-06-25 ENCOUNTER — Ambulatory Visit (INDEPENDENT_AMBULATORY_CARE_PROVIDER_SITE_OTHER): Payer: Commercial Managed Care - HMO | Admitting: Podiatry

## 2013-06-25 VITALS — BP 178/89 | HR 92 | Resp 18

## 2013-06-25 DIAGNOSIS — M79609 Pain in unspecified limb: Secondary | ICD-10-CM

## 2013-06-25 DIAGNOSIS — B351 Tinea unguium: Secondary | ICD-10-CM

## 2013-06-25 NOTE — Patient Instructions (Signed)
Diabetes and Foot Care Diabetes may cause you to have problems because of poor blood supply (circulation) to your feet and legs. This may cause the skin on your feet to become thinner, break easier, and heal more slowly. Your skin may become dry, and the skin may peel and crack. You may also have nerve damage in your legs and feet causing decreased feeling in them. You may not notice minor injuries to your feet that could lead to infections or more serious problems. Taking care of your feet is one of the most important things you can do for yourself.  HOME CARE INSTRUCTIONS  Wear shoes at all times, even in the house. Do not go barefoot. Bare feet are easily injured.  Check your feet daily for blisters, cuts, and redness. If you cannot see the bottom of your feet, use a mirror or ask someone for help.  Wash your feet with warm water (do not use hot water) and mild soap. Then pat your feet and the areas between your toes until they are completely dry. Do not soak your feet as this can dry your skin.  Apply a moisturizing lotion or petroleum jelly (that does not contain alcohol and is unscented) to the skin on your feet and to dry, brittle toenails. Do not apply lotion between your toes.  Trim your toenails straight across. Do not dig under them or around the cuticle. File the edges of your nails with an emery board or nail file.  Do not cut corns or calluses or try to remove them with medicine.  Wear clean socks or stockings every day. Make sure they are not too tight. Do not wear knee-high stockings since they may decrease blood flow to your legs.  Wear shoes that fit properly and have enough cushioning. To break in new shoes, wear them for just a few hours a day. This prevents you from injuring your feet. Always look in your shoes before you put them on to be sure there are no objects inside.  Do not cross your legs. This may decrease the blood flow to your feet.  If you find a minor scrape,  cut, or break in the skin on your feet, keep it and the skin around it clean and dry. These areas may be cleansed with mild soap and water. Do not cleanse the area with peroxide, alcohol, or iodine.  When you remove an adhesive bandage, be sure not to damage the skin around it.  If you have a wound, look at it several times a day to make sure it is healing.  Do not use heating pads or hot water bottles. They may burn your skin. If you have lost feeling in your feet or legs, you may not know it is happening until it is too late.  Make sure your health care provider performs a complete foot exam at least annually or more often if you have foot problems. Report any cuts, sores, or bruises to your health care provider immediately. SEEK MEDICAL CARE IF:   You have an injury that is not healing.  You have cuts or breaks in the skin.  You have an ingrown nail.  You notice redness on your legs or feet.  You feel burning or tingling in your legs or feet.  You have pain or cramps in your legs and feet.  Your legs or feet are numb.  Your feet always feel cold. SEEK IMMEDIATE MEDICAL CARE IF:   There is increasing redness,   swelling, or pain in or around a wound.  There is a red line that goes up your leg.  Pus is coming from a wound.  You develop a fever or as directed by your health care provider.  You notice a bad smell coming from an ulcer or wound. Document Released: 03/05/2000 Document Revised: 11/08/2012 Document Reviewed: 08/15/2012 Exit[employer] Patient Information 2014 [employer].  

## 2013-06-25 NOTE — Progress Notes (Signed)
° °  Subjective:    Patient ID: Marcus Weaver, male    DOB: 1930-12-26, 80 y.o.   MRN: [id number]  HPI I think the doctor sent me over here and her name is Dr. Birdie Riddle and I need my toenails trimmed up and they are numb and my feet get cold and I have been a diabetic for about 3 years    Review of Systems  Respiratory: Positive for cough.   Neurological: Positive for headaches.  All other systems reviewed and are negative.       Objective:   Physical Exam Orientated x52 80 year old black male  Vascular: P.m. PT pulses are 1/4 bilaterally  Neurological: Sensation to 10 g monofilament wire intact 5/5 bilaterally. Vibratory sensation nonreactive bilaterally. Ankle reflexes reactive bilaterally.  Dermatological: Atrophic skin without hair growth noted bilaterally. Hypertrophic, elongated, discolored, incurvated toenails x10.  Musculoskeletal: Mild HAV deformities noted bilaterally. Hammered second digit left noted       Assessment & Plan:   Assessment: Suspect peripheral arterial disease because are diminished pedal pulses bilaterally Symptomatic onychomycoses x10  Plan: Nails x10 are debrided without any bleeding. Reappoint at three-month intervals. Diabetic footcare information provided today.

## 2013-06-26 ENCOUNTER — Encounter: Payer: Self-pay | Admitting: Podiatry

## 2013-06-27 ENCOUNTER — Other Ambulatory Visit: Payer: Self-pay | Admitting: General Practice

## 2013-06-27 MED ORDER — LISINOPRIL 20 MG PO TABS: ORAL_TABLET | ORAL | Status: DC

## 2013-06-27 MED ORDER — GLUCOSE BLOOD VI STRP: ORAL_STRIP | Status: DC

## 2013-06-27 MED ORDER — METFORMIN HCL 1000 MG PO TABS: 1000.0000 mg | ORAL_TABLET | Freq: Two times a day (BID) | ORAL | Status: DC

## 2013-06-27 MED ORDER — ACCU-CHEK NANO SMARTVIEW W/DEVICE KIT: PACK | Status: DC

## 2013-06-27 MED ORDER — ALCOHOL SWABS PADS: MEDICATED_PAD | Status: DC

## 2013-06-27 MED ORDER — ACCU-CHEK SMARTVIEW CONTROL VI LIQD: Status: DC

## 2013-06-27 MED ORDER — ACCU-CHEK FASTCLIX LANCETS MISC: Status: DC

## 2013-06-27 NOTE — Telephone Encounter (Signed)
meds filled

## 2013-07-05 ENCOUNTER — Encounter: Payer: Self-pay | Admitting: *Deleted

## 2014-01-18 ENCOUNTER — Other Ambulatory Visit: Payer: Self-pay | Admitting: General Practice

## 2014-01-18 ENCOUNTER — Encounter: Payer: Self-pay | Admitting: General Practice

## 2014-01-18 MED ORDER — LISINOPRIL 20 MG PO TABS: ORAL_TABLET | ORAL | Status: DC

## 2014-05-10 ENCOUNTER — Ambulatory Visit: Payer: Commercial Managed Care - HMO | Admitting: Family Medicine

## 2014-05-10 ENCOUNTER — Telehealth: Payer: Self-pay | Admitting: Family Medicine

## 2014-05-10 NOTE — Telephone Encounter (Signed)
Wife stated she will call Monday 05/13/14 to schedule follow up appointment

## 2014-05-10 NOTE — Telephone Encounter (Signed)
Please reschedule pt and make sure he still receives the NS fee

## 2014-05-10 NOTE — Telephone Encounter (Signed)
Wife canceled pt appointment due to pt not being able to leave work early.

## 2014-05-10 NOTE — Telephone Encounter (Signed)
Pt has hx of noncompliance- needs f/u appt ASAP and needs a no-show fee

## 2014-05-21 ENCOUNTER — Telehealth: Payer: Self-pay | Admitting: Family Medicine

## 2014-05-21 NOTE — Telephone Encounter (Signed)
Called and submitted PA over the phone with [facility]. Awaiting determination. JG//CMA

## 2014-05-21 NOTE — Telephone Encounter (Signed)
Caller name: Dreama  Relation to pt: [facility] Call back number: [phone]   Reason for call:  As per [facility] options checking on the prior-auth for insulin lispro protamine-lispro (HUMALOG 75/25 MIX) (75-25) 100 UNIT/ML SUSP injection.

## 2014-05-23 NOTE — Telephone Encounter (Signed)
PA approved effective 05/22/2014 through 03/22/2015. JG//CMA

## 2014-06-03 ENCOUNTER — Ambulatory Visit (INDEPENDENT_AMBULATORY_CARE_PROVIDER_SITE_OTHER): Payer: PPO | Admitting: Family Medicine

## 2014-06-03 ENCOUNTER — Encounter: Payer: Self-pay | Admitting: Family Medicine

## 2014-06-03 ENCOUNTER — Other Ambulatory Visit: Payer: Self-pay | Admitting: Family Medicine

## 2014-06-03 VITALS — BP 144/84 | HR 98 | Temp 97.9°F | Resp 17 | Wt 191.5 lb

## 2014-06-03 DIAGNOSIS — Z23 Encounter for immunization: Secondary | ICD-10-CM

## 2014-06-03 DIAGNOSIS — I10 Essential (primary) hypertension: Secondary | ICD-10-CM

## 2014-06-03 DIAGNOSIS — E1136 Type 2 diabetes mellitus with diabetic cataract: Secondary | ICD-10-CM

## 2014-06-03 DIAGNOSIS — E1169 Type 2 diabetes mellitus with other specified complication: Secondary | ICD-10-CM

## 2014-06-03 DIAGNOSIS — E785 Hyperlipidemia, unspecified: Secondary | ICD-10-CM

## 2014-06-03 MED ORDER — INSULIN LISPRO 100 UNIT/ML (KWIKPEN): PEN_INJECTOR | SUBCUTANEOUS | Status: DC

## 2014-06-03 MED ORDER — NIFEDIPINE ER 30 MG PO TB24: 30.0000 mg | ORAL_TABLET | Freq: Every day | ORAL | Status: DC

## 2014-06-03 NOTE — Patient Instructions (Signed)
Follow up in 3-4 months to recheck sugar We'll notify you of your lab results and make any changes if needed We'll call you with your podiatry appt Please call and schedule your eye exam Call with any questions or concerns Hang in there!!!

## 2014-06-03 NOTE — Progress Notes (Signed)
   Subjective:    Patient ID: Marcus Weaver, male    DOB: Apr 15, 1930, 81 y.o.   MRN: [id number]  HPI DM- chronic problem, currently on Metformin and Humalog.  Pt has been out of insulin for 'a few days'.  Due for eye exam this year- had one last year.  Pt saw podiatry last year- due to return ([place]), due for eye exam (going to [place]).  Will have symptomatic lows if he doesn't eat.  Pt has long hx of non-compliance.  Was actually fired from Endo.  HTN- chronic problem, on Lisinopril but not taking Nifedipine.  BP is not well controlled.  No CP, SOB, HAs.  Hyperlipidemia- chronic problem, pt is not taking Simvastatin.  Denies abd pain, N/V.   Review of Systems For ROS see HPI     Objective:   Physical Exam  Constitutional: He is oriented to person, place, and time. He appears well-developed and well-nourished. No distress.  HENT:  Head: Normocephalic and atraumatic.  Multiple broken, decaying teeth  Eyes: Conjunctivae and EOM are normal. Pupils are equal, round, and reactive to light.  Neck: Normal range of motion. Neck supple. No thyromegaly present.  Cardiovascular: Normal rate, regular rhythm, normal heart sounds and intact distal pulses.   No murmur heard. Pulmonary/Chest: Effort normal and breath sounds normal. No respiratory distress.  Abdominal: Soft. Bowel sounds are normal. He exhibits no distension.  Musculoskeletal: He exhibits no edema.  Lymphadenopathy:    He has no cervical adenopathy.  Neurological: He is alert and oriented to person, place, and time. No cranial nerve deficit.  Skin: Skin is warm and dry.  Psychiatric: He has a normal mood and affect. His behavior is normal.  Vitals reviewed.         Assessment & Plan:

## 2014-06-03 NOTE — Telephone Encounter (Signed)
Med filled.  

## 2014-06-03 NOTE — Progress Notes (Signed)
Pre visit review using our clinic review tool, if applicable. No additional management support is needed unless otherwise documented below in the visit note. 

## 2014-06-04 ENCOUNTER — Other Ambulatory Visit: Payer: Self-pay | Admitting: Family Medicine

## 2014-06-04 DIAGNOSIS — E785 Hyperlipidemia, unspecified: Principal | ICD-10-CM

## 2014-06-04 DIAGNOSIS — E1169 Type 2 diabetes mellitus with other specified complication: Secondary | ICD-10-CM

## 2014-06-04 LAB — LIPID PANEL
Cholesterol: 188 mg/dL (ref 0–200)
HDL: 52.8 mg/dL (ref >39.00)
LDL Cholesterol: 123 mg/dL — ABNORMAL HIGH (ref 0–99)
NonHDL: 135.2
Total CHOL/HDL Ratio: 4
Triglycerides: 63 mg/dL (ref 0.0–149.0)
VLDL: 12.6 mg/dL (ref 0.0–40.0)

## 2014-06-04 LAB — CBC WITH DIFFERENTIAL/PLATELET
BASOS ABS: 0.1 10*3/uL (ref 0.0–0.1)
BASOS PCT: 0.9 % (ref 0.0–3.0)
EOS ABS: 0.2 10*3/uL (ref 0.0–0.7)
Eosinophils Relative: 2.1 % (ref 0.0–5.0)
HCT: 43.8 % (ref 39.0–52.0)
HEMOGLOBIN: 14.7 g/dL (ref 13.0–17.0)
LYMPHS ABS: 2.5 10*3/uL (ref 0.7–4.0)
Lymphocytes Relative: 28.7 % (ref 12.0–46.0)
MCHC: 33.6 g/dL (ref 30.0–36.0)
MCV: 87.5 fl (ref 78.0–100.0)
MONO ABS: 0.3 10*3/uL (ref 0.1–1.0)
Monocytes Relative: 3.8 % (ref 3.0–12.0)
NEUTROS ABS: 5.5 10*3/uL (ref 1.4–7.7)
Neutrophils Relative %: 64.5 % (ref 43.0–77.0)
Platelets: 197 10*3/uL (ref 150.0–400.0)
RBC: 5 Mil/uL (ref 4.22–5.81)
RDW: 13.3 % (ref 11.5–15.5)
WBC: 8.6 10*3/uL (ref 4.0–10.5)

## 2014-06-04 LAB — BASIC METABOLIC PANEL
BUN: 23 mg/dL (ref 6–23)
CO2: 27 meq/L (ref 19–32)
Calcium: 9.5 mg/dL (ref 8.4–10.5)
Chloride: 103 mEq/L (ref 96–112)
Creatinine, Ser: 1.41 mg/dL (ref 0.40–1.50)
GFR: 61.88 mL/min (ref >60.00)
Glucose, Bld: 275 mg/dL — ABNORMAL HIGH (ref 70–99)
Potassium: 4.2 mEq/L (ref 3.5–5.1)
SODIUM: 137 meq/L (ref 135–145)

## 2014-06-04 LAB — HEMOGLOBIN A1C

## 2014-06-04 LAB — HEPATIC FUNCTION PANEL
ALBUMIN: 3.6 g/dL (ref 3.5–5.2)
ALT: 9 U/L (ref 0–53)
AST: 14 U/L (ref 0–37)
Alkaline Phosphatase: 67 U/L (ref 39–117)
BILIRUBIN DIRECT: 0.1 mg/dL (ref 0.0–0.3)
BILIRUBIN TOTAL: 0.4 mg/dL (ref 0.2–1.2)
Total Protein: 6.9 g/dL (ref 6.0–8.3)

## 2014-06-04 LAB — TSH: TSH: 1.93 u[IU]/mL (ref 0.35–4.50)

## 2014-06-04 NOTE — Assessment & Plan Note (Signed)
Chronic problem.  Pt was fired from Endo for noncompliance and has not followed up here w/ me as instructed.  Has been out of Humalog for reportedly 4 days but suspect it's actually more.  Pt states he's asymptomatic.  Due for eye exam- encouraged to schedule.  Re-referred for podiatry exam.  Again discussed need for compliance w/ meds, diet, and follow ups.  Check labs.  Adjust meds prn

## 2014-06-04 NOTE — Assessment & Plan Note (Signed)
Chronic problem.  Pt has stopped taking Simvastatin w/o obvious reason.  Suspect pt ran out of meds and did not know to get it filled.  Check labs.  Restart meds at appropriate dose.

## 2014-06-04 NOTE — Assessment & Plan Note (Signed)
Deteriorated.  BP is elevated b/c pt ran out of nifedipine and did not call for refill.  Restart today.

## 2014-06-05 ENCOUNTER — Other Ambulatory Visit: Payer: Self-pay | Admitting: Family Medicine

## 2014-06-05 ENCOUNTER — Other Ambulatory Visit: Payer: Self-pay | Admitting: General Practice

## 2014-06-05 DIAGNOSIS — E1169 Type 2 diabetes mellitus with other specified complication: Secondary | ICD-10-CM

## 2014-06-05 DIAGNOSIS — E785 Hyperlipidemia, unspecified: Principal | ICD-10-CM

## 2014-06-05 MED ORDER — SIMVASTATIN 20 MG PO TABS: 20.0000 mg | ORAL_TABLET | Freq: Every day | ORAL | Status: DC

## 2014-06-21 ENCOUNTER — Other Ambulatory Visit: Payer: Self-pay | Admitting: Family Medicine

## 2014-06-21 NOTE — Telephone Encounter (Signed)
Med filled.  

## 2014-06-25 ENCOUNTER — Telehealth: Payer: Self-pay | Admitting: *Deleted

## 2014-06-25 NOTE — Telephone Encounter (Signed)
Prior authorization for Humalog Kwikpen initiated. Awaiting determination. JG//CMA

## 2014-06-27 MED ORDER — INSULIN ASPART PROT & ASPART (70-30 MIX) 100 UNIT/ML PEN: PEN_INJECTOR | SUBCUTANEOUS | Status: DC

## 2014-06-27 NOTE — Telephone Encounter (Signed)
Patient states that novolog would be cheaper for him and is wanting to know if this could be prescribed? Best # [phone]

## 2014-06-27 NOTE — Telephone Encounter (Signed)
Ok to switch to Novolog 70/30- same doses as before

## 2014-06-27 NOTE — Telephone Encounter (Signed)
Please advise 

## 2014-06-27 NOTE — Addendum Note (Signed)
Addended by: Kris Hartmann on: 06/27/2014 01:25 PM   Modules accepted: Orders

## 2014-06-27 NOTE — Telephone Encounter (Signed)
Called pt and left a message to inform that the Novolog would be sent in. Filled to pharmacy on [address].

## 2014-08-07 ENCOUNTER — Telehealth: Payer: Self-pay | Admitting: Endocrinology

## 2014-08-07 NOTE — Telephone Encounter (Signed)
Error

## 2014-08-09 ENCOUNTER — Encounter: Payer: Self-pay | Admitting: Endocrinology

## 2014-08-09 ENCOUNTER — Telehealth: Payer: Self-pay | Admitting: Family Medicine

## 2014-08-09 ENCOUNTER — Ambulatory Visit (INDEPENDENT_AMBULATORY_CARE_PROVIDER_SITE_OTHER): Payer: PPO | Admitting: Endocrinology

## 2014-08-09 VITALS — BP 136/87 | HR 117 | Temp 98.5°F | Ht 67.5 in | Wt 189.0 lb

## 2014-08-09 DIAGNOSIS — E1136 Type 2 diabetes mellitus with diabetic cataract: Secondary | ICD-10-CM | POA: Diagnosis not present

## 2014-08-09 MED ORDER — METFORMIN HCL ER 500 MG PO TB24: ORAL_TABLET | ORAL | Status: DC

## 2014-08-09 MED ORDER — INSULIN ASPART PROT & ASPART (70-30 MIX) 100 UNIT/ML PEN: PEN_INJECTOR | SUBCUTANEOUS | Status: DC

## 2014-08-09 NOTE — Patient Instructions (Addendum)
good diet and exercise significantly improve the control of your diabetes.  please let me know if you wish to be referred to a dietician.  high blood sugar is very risky to your health.  you should see an eye doctor and dentist every year.  It is very important to get all recommended vaccinations.  controlling your blood pressure and cholesterol drastically reduces the damage diabetes does to your body.  Those who smoke should quit.  please discuss these with your doctor.  check your blood sugar twice a day.  vary the time of day when you check, between before the 3 meals, and at bedtime.  also check if you have symptoms of your blood sugar being too high or too low.  please keep a record of the readings and bring it to your next appointment here.  You can write it on any piece of paper.  please call us sooner if your blood sugar goes below 70, or if you have a lot of readings over 200. At our office, we are fortunate to have two specialists who are happy to help you:   Leonia Reader, RN, CDE, is a diabetes educator and pump trainer.  She is here on Monday mornings, and all day Tuesday and Wednesday.  She is can help you with low blood sugar avoidance and treatment, injecting insulin, sick day management, and others.   Antonieta Iba, RD is our dietician.  She is here all day Thursday and Friday.  She can advise you about a healthy diet.  She can also help you about a variety of special diabetes situations, such as shift work, Actor, gluten-free, diet for kidney patients, traveling with diabetes, and help for those who need to gain weight.   For now, please change the insulin to 45 units with breakfast and 25 units with the evening meal.  Also, i have sent a prescription to your pharmacy, to change the metformin to extended-release, because of your stomach symptoms.  Please come back for a follow-up appointment in 2-3 weeks

## 2014-08-09 NOTE — Progress Notes (Signed)
Subjective:    Patient ID: Marcus Weaver, male    DOB: 06-09-1930, 81 y.o.   MRN: [id number]  HPI pt states DM was dx'ed in 2009; he has mild if any neuropathy of the lower extremities, but he has associated renal insufficiency; he has been on insulin since 2014; pt says his diet is good, but exercise is limited by health problems; he has never had pancreatitis or DKA.  He has had only 1 episode of severe hypoglycemia (2011).  He says cbg's vary from 90-200's.  It is in general higher as the day goes on.  Pt says he never misses the insulin.   Past Medical History  Diagnosis Date  . Hypertension   . Diabetes mellitus     type2  . Hyperlipidemia     No past surgical history on file.  History   Social History  . Marital Status: Single    Spouse Name: N/A  . Number of Children: N/A  . Years of Education: N/A   Occupational History  . retired    Social History Main Topics  . Smoking status: Current Some Day Smoker  . Smokeless tobacco: Never Used  . Alcohol Use: No  . Drug Use: No  . Sexual Activity: Not on file   Other Topics Concern  . Not on file   Social History Narrative    Current Outpatient Prescriptions on File Prior to Visit  Medication Sig Dispense Refill  . ACCU-CHEK FASTCLIX LANCETS MISC Please use one lancet each time glucose levels are tested. Pt tests once daily. Dx. 250.00 102 each 3  . Alcohol Swabs PADS Use daily for glucose testing. Dx. 250.00 120 each 3  . aspirin 81 MG tablet Take 81 mg by mouth daily.    . B-D ULTRAFINE III SHORT PEN 31G X 8 MM MISC USE AS DIRECTED 100 each 1  . Blood Glucose Calibration (ACCU-CHEK SMARTVIEW CONTROL) LIQD Please use to check the controls on accu-chek glucometer. 2 each 3  . Blood Glucose Monitoring Suppl (ACCU-CHEK NANO SMARTVIEW) W/DEVICE KIT Please dispense one glucometer. Dx. 250.00 1 kit 0  . glucose blood (ACCU-CHEK SMARTVIEW) test strip Please use on test strip each time glucose levels are tested. Pt checks  once daily. Dx. 250.00 100 each 12  . lisinopril (PRINIVIL,ZESTRIL) 20 MG tablet TAKE 1 TABLET BY MOUTH DAILY 90 tablet 0  . NIFEdipine (PROCARDIA-XL/ADALAT CC) 30 MG 24 hr tablet Take 1 tablet (30 mg total) by mouth daily. 30 tablet 3  . simvastatin (ZOCOR) 20 MG tablet Take 1 tablet (20 mg total) by mouth at bedtime. 90 tablet 1   No current facility-administered medications on file prior to visit.    No Known Allergies  Family History  Problem Relation Age of Onset  . Cancer Neg Hx   . Diabetes Other     1st degree relative  . Hypertension Other     BP 136/87 mmHg  Pulse 117  Temp(Src) 98.5 F (36.9 C) (Oral)  Ht 5' 7.5" (1.715 m)  Wt 189 lb (85.73 kg)  BMI 29.15 kg/m2  SpO2 97%   Review of Systems denies weight loss, blurry vision, headache, chest pain, sob, urinary frequency, muscle cramps, excessive diaphoresis, memory loss, cold intolerance, rhinorrhea, and easy bruising.  He says metformin causes nausea.       Objective:   Physical Exam VS: see vs page GEN: no distress Pulses: dorsalis pedis intact bilat.   MSK: no deformity of the feet CV: no leg  edema Skin:  no ulcer on the feet.  normal color and temp on the feet. Neuro: sensation is intact to touch on the feet. Ext: There is bilateral onychomycosis of the toenails    Lab Results  Component Value Date   HGBA1C 13.0 Repeated and verified X2.* 06/03/2014   Lab Results  Component Value Date   CREATININE 1.41 06/03/2014   BUN 23 06/03/2014   NA 137 06/03/2014   K 4.2 06/03/2014   CL 103 06/03/2014   CO2 27 06/03/2014      Assessment & Plan:  DM: severe exacerbation Nausea: new to me: possibly due to metformin. Renal insuff: borderline for continuing metformin: we'll follow for now.    Patient is advised the following: Patient Instructions  good diet and exercise significantly improve the control of your diabetes.  please let me know if you wish to be referred to a dietician.  high blood sugar is  very risky to your health.  you should see an eye doctor and dentist every year.  It is very important to get all recommended vaccinations.  controlling your blood pressure and cholesterol drastically reduces the damage diabetes does to your body.  Those who smoke should quit.  please discuss these with your doctor.  check your blood sugar twice a day.  vary the time of day when you check, between before the 3 meals, and at bedtime.  also check if you have symptoms of your blood sugar being too high or too low.  please keep a record of the readings and bring it to your next appointment here.  You can write it on any piece of paper.  please call us sooner if your blood sugar goes below 70, or if you have a lot of readings over 200. At our office, we are fortunate to have two specialists who are happy to help you:   Leonia Reader, RN, CDE, is a diabetes educator and pump trainer.  She is here on Monday mornings, and all day Tuesday and Wednesday.  She is can help you with low blood sugar avoidance and treatment, injecting insulin, sick day management, and others.   Antonieta Iba, RD is our dietician.  She is here all day Thursday and Friday.  She can advise you about a healthy diet.  She can also help you about a variety of special diabetes situations, such as shift work, Actor, gluten-free, diet for kidney patients, traveling with diabetes, and help for those who need to gain weight.   For now, please change the insulin to 45 units with breakfast and 25 units with the evening meal.  Also, i have sent a prescription to your pharmacy, to change the metformin to extended-release, because of your stomach symptoms.  Please come back for a follow-up appointment in 2-3 weeks

## 2014-08-09 NOTE — Telephone Encounter (Signed)
Caller name: tara at endocrinology Relation to pt: Call back number: Pharmacy: [facility] on [address]  Reason for call:   Patient is out and requesting lisinopril refill for him.

## 2014-08-12 ENCOUNTER — Other Ambulatory Visit: Payer: Self-pay | Admitting: General Practice

## 2014-08-12 MED ORDER — LISINOPRIL 20 MG PO TABS: ORAL_TABLET | ORAL | Status: DC

## 2014-08-12 NOTE — Telephone Encounter (Signed)
Refill was sent in today.

## 2014-08-23 ENCOUNTER — Ambulatory Visit (INDEPENDENT_AMBULATORY_CARE_PROVIDER_SITE_OTHER): Payer: PPO | Admitting: Endocrinology

## 2014-08-23 ENCOUNTER — Encounter: Payer: Self-pay | Admitting: Endocrinology

## 2014-08-23 VITALS — BP 136/84 | HR 124 | Temp 98.6°F | Ht 67.5 in | Wt 193.0 lb

## 2014-08-23 DIAGNOSIS — E1169 Type 2 diabetes mellitus with other specified complication: Secondary | ICD-10-CM

## 2014-08-23 DIAGNOSIS — E785 Hyperlipidemia, unspecified: Secondary | ICD-10-CM | POA: Diagnosis not present

## 2014-08-23 DIAGNOSIS — E1136 Type 2 diabetes mellitus with diabetic cataract: Secondary | ICD-10-CM

## 2014-08-23 MED ORDER — INSULIN ASPART PROT & ASPART (70-30 MIX) 100 UNIT/ML PEN: PEN_INJECTOR | SUBCUTANEOUS | Status: DC

## 2014-08-23 NOTE — Patient Instructions (Addendum)
check your blood sugar twice a day.  vary the time of day when you check, between before the 3 meals, and at bedtime.  also check if you have symptoms of your blood sugar being too high or too low.  please keep a record of the readings and bring it to your next appointment here.  You can write it on any piece of paper.  please call us sooner if your blood sugar goes below 70, or if you have a lot of readings over 200.   For now, please change the insulin to 40 units with breakfast and 30 units with the evening meal.  Please come back for a follow-up appointment in 3 months.  Please continue the same metformin.   Please see Dr Birdie Riddle, to see why your heart is racing.

## 2014-08-23 NOTE — Progress Notes (Signed)
Subjective:    Patient ID: Marcus Weaver, male    DOB: Oct 05, 1930, 81 y.o.   MRN: [id number]  HPI Pt returns for f/u of diabetes mellitus: DM type: Insulin-requiring type 2 Dx'ed: 2009 Complications: renal insufficiency Therapy: insulin since 2014 DKA: never Severe hypoglycemia: once (2011) Pancreatitis: never Other: he has chosen a bid insulin regimen over multiple daily injections Interval history: He takes 40 qam and 30 qpm.  no cbg record, but states cbg's are  In the 100's.  pt states he feels well in general.   Past Medical History  Diagnosis Date  . Hypertension   . Diabetes mellitus     type2  . Hyperlipidemia     No past surgical history on file.  History   Social History  . Marital Status: Single    Spouse Name: N/A  . Number of Children: N/A  . Years of Education: N/A   Occupational History  . retired    Social History Main Topics  . Smoking status: Current Some Day Smoker  . Smokeless tobacco: Never Used  . Alcohol Use: No  . Drug Use: No  . Sexual Activity: Not on file   Other Topics Concern  . Not on file   Social History Narrative    Current Outpatient Prescriptions on File Prior to Visit  Medication Sig Dispense Refill  . ACCU-CHEK FASTCLIX LANCETS MISC Please use one lancet each time glucose levels are tested. Pt tests once daily. Dx. 250.00 102 each 3  . Alcohol Swabs PADS Use daily for glucose testing. Dx. 250.00 120 each 3  . aspirin 81 MG tablet Take 81 mg by mouth daily.    . B-D ULTRAFINE III SHORT PEN 31G X 8 MM MISC USE AS DIRECTED 100 each 1  . Blood Glucose Calibration (ACCU-CHEK SMARTVIEW CONTROL) LIQD Please use to check the controls on accu-chek glucometer. 2 each 3  . Blood Glucose Monitoring Suppl (ACCU-CHEK NANO SMARTVIEW) W/DEVICE KIT Please dispense one glucometer. Dx. 250.00 1 kit 0  . glucose blood (ACCU-CHEK SMARTVIEW) test strip Please use on test strip each time glucose levels are tested. Pt checks once daily. Dx.  250.00 100 each 12  . lisinopril (PRINIVIL,ZESTRIL) 20 MG tablet TAKE 1 TABLET BY MOUTH DAILY 90 tablet 0  . metFORMIN (GLUCOPHAGE-XR) 500 MG 24 hr tablet 2 tabs, twice a day. 120 tablet 11  . NIFEdipine (PROCARDIA-XL/ADALAT CC) 30 MG 24 hr tablet Take 1 tablet (30 mg total) by mouth daily. 30 tablet 3  . simvastatin (ZOCOR) 20 MG tablet Take 1 tablet (20 mg total) by mouth at bedtime. 90 tablet 1   No current facility-administered medications on file prior to visit.    No Known Allergies  Family History  Problem Relation Age of Onset  . Cancer Neg Hx   . Diabetes Other     1st degree relative  . Hypertension Other     BP 136/84 mmHg  Pulse 124  Temp(Src) 98.6 F (37 C) (Oral)  Ht 5' 7.5" (1.715 m)  Wt 193 lb (87.544 kg)  BMI 29.76 kg/m2  SpO2 95%   Review of Systems He denies hypoglycemia.  Nausea resolved with metformin change.      Objective:   Physical Exam VITAL SIGNS:  See vs page GENERAL: no distress Pulses: dorsalis pedis intact bilat.   MSK: no deformity of the feet CV: no leg edema Skin:  no ulcer on the feet.  normal color and temp on the feet. Neuro: sensation is  intact to touch on the feet  Lab Results  Component Value Date   HGBA1C 13.0 Repeated and verified X2.* 06/03/2014    i personally reviewed electrocardiogram tracing: sinus tachycardia     Assessment & Plan:  DM: The pattern of his cbg's indicates he needs some adjustment in his therapy Tachycardia: new to me, uncertain etiology.  It could be neuropathic, but duration of DM is short for this complication. Nausea, better with changing metformin to -XR.   Patient is advised the following: Patient Instructions  check your blood sugar twice a day.  vary the time of day when you check, between before the 3 meals, and at bedtime.  also check if you have symptoms of your blood sugar being too high or too low.  please keep a record of the readings and bring it to your next appointment here.  You  can write it on any piece of paper.  please call us sooner if your blood sugar goes below 70, or if you have a lot of readings over 200.   For now, please change the insulin to 40 units with breakfast and 30 units with the evening meal.  Please come back for a follow-up appointment in 3 months.  Please continue the same metformin.   Please see Dr Birdie Riddle, to see why your heart is racing.

## 2014-08-26 LAB — HEMOGLOBIN A1C: Hgb A1c MFr Bld: 5.9 % (ref 4.6–6.5)

## 2014-08-26 LAB — FRUCTOSAMINE: FRUCTOSAMINE: 352 umol/L — AB (ref 190–270)

## 2014-09-17 ENCOUNTER — Ambulatory Visit: Payer: PPO | Admitting: Family Medicine

## 2014-10-05 ENCOUNTER — Other Ambulatory Visit: Payer: Self-pay | Admitting: Family Medicine

## 2014-10-07 NOTE — Telephone Encounter (Signed)
Med filled.  

## 2014-11-11 ENCOUNTER — Other Ambulatory Visit: Payer: Self-pay | Admitting: Family Medicine

## 2014-11-12 NOTE — Telephone Encounter (Signed)
Medication filled to pharmacy as requested.   

## 2014-11-26 ENCOUNTER — Encounter: Payer: Self-pay | Admitting: Endocrinology

## 2014-11-26 ENCOUNTER — Ambulatory Visit (INDEPENDENT_AMBULATORY_CARE_PROVIDER_SITE_OTHER): Payer: PPO | Admitting: Endocrinology

## 2014-11-26 VITALS — BP 124/62 | HR 100 | Temp 98.1°F | Ht 67.5 in | Wt 188.0 lb

## 2014-11-26 DIAGNOSIS — E1136 Type 2 diabetes mellitus with diabetic cataract: Secondary | ICD-10-CM | POA: Diagnosis not present

## 2014-11-26 LAB — POCT GLYCOSYLATED HEMOGLOBIN (HGB A1C): Hemoglobin A1C: 11.5

## 2014-11-26 MED ORDER — INSULIN ASPART PROT & ASPART (70-30 MIX) 100 UNIT/ML PEN: PEN_INJECTOR | SUBCUTANEOUS | Status: DC

## 2014-11-26 NOTE — Patient Instructions (Addendum)
check your blood sugar twice a day.  vary the time of day when you check, between before the 3 meals, and at bedtime.  also check if you have symptoms of your blood sugar being too high or too low.  please keep a record of the readings and bring it to your next appointment here.  You can write it on any piece of paper.  please call us sooner if your blood sugar goes below 70, or if you have a lot of readings over 200.   For now, please continue the insulin, 40 units with breakfast and 30 units with the evening meal.  blood tests are requested for you today.  We'll let you know about the results.   Please come back for a follow-up appointment in 3 months.

## 2014-11-26 NOTE — Progress Notes (Signed)
Subjective:    Patient ID: Marcus Weaver, male    DOB: [birth year], 81 y.o.   MRN: [id number]  HPI Pt returns for f/u of diabetes mellitus: DM type: Insulin-requiring type 2 Dx'ed: 9470 Complications: renal insufficiency Therapy: insulin since 2014 (and metformin).   DKA: never.   Severe hypoglycemia: once (2011).   Pancreatitis: never.   Other: he has chosen a bid insulin regimen over multiple daily injections.   Interval history: He takes 40 qam and 30 qpm.  no cbg record, but states cbg's vary from 70-90.  It is in general higher as the day goes on.  pt states he feels well in general.  Pt says he never misses the insulin.  He says he is uncertain if he takes metformin.  He brings med bottles, but these do not include it.  He says it might be waiting for him at the pharmacy.  Past Medical History  Diagnosis Date  . Hypertension   . Diabetes mellitus     type2  . Hyperlipidemia     No past surgical history on file.  Social History   Social History  . Marital Status: Single    Spouse Name: N/A  . Number of Children: N/A  . Years of Education: N/A   Occupational History  . retired    Social History Main Topics  . Smoking status: Current Some Day Smoker  . Smokeless tobacco: Never Used  . Alcohol Use: No  . Drug Use: No  . Sexual Activity: Not on file   Other Topics Concern  . Not on file   Social History Narrative    Current Outpatient Prescriptions on File Prior to Visit  Medication Sig Dispense Refill  . ACCU-CHEK FASTCLIX LANCETS MISC Please use one lancet each time glucose levels are tested. Pt tests once daily. Dx. 250.00 102 each 3  . Alcohol Swabs PADS Use daily for glucose testing. Dx. 250.00 120 each 3  . aspirin 81 MG tablet Take 81 mg by mouth daily.    . B-D ULTRAFINE III SHORT PEN 31G X 8 MM MISC USE AS DIRECTED 100 each 1  . Blood Glucose Calibration (ACCU-CHEK SMARTVIEW CONTROL) LIQD Please use to check the controls on accu-chek glucometer. 2 each  3  . Blood Glucose Monitoring Suppl (ACCU-CHEK NANO SMARTVIEW) W/DEVICE KIT Please dispense one glucometer. Dx. 250.00 1 kit 0  . glucose blood (ACCU-CHEK SMARTVIEW) test strip Please use on test strip each time glucose levels are tested. Pt checks once daily. Dx. 250.00 100 each 12  . lisinopril (PRINIVIL,ZESTRIL) 20 MG tablet TAKE 1 TABLET BY MOUTH DAILY 90 tablet 0  . metFORMIN (GLUCOPHAGE-XR) 500 MG 24 hr tablet 2 tabs, twice a day. 120 tablet 11  . NIFEdipine (PROCARDIA-XL/ADALAT CC) 30 MG 24 hr tablet TAKE 1 TABLET(30 MG) BY MOUTH DAILY 30 tablet 2  . simvastatin (ZOCOR) 20 MG tablet Take 1 tablet (20 mg total) by mouth at bedtime. 90 tablet 1   No current facility-administered medications on file prior to visit.    No Known Allergies  Family History  Problem Relation Age of Onset  . Cancer Neg Hx   . Diabetes Other     1st degree relative  . Hypertension Other     BP 124/62 mmHg  Pulse 100  Temp(Src) 98.1 F (36.7 C) (Oral)  Ht 5' 7.5" (1.715 m)  Wt 188 lb (85.276 kg)  BMI 28.99 kg/m2  SpO2 97%  Review of Systems He denies hypoglycemia.  Objective:   Physical Exam VITAL SIGNS:  See vs page GENERAL: no distress Pulses: dorsalis pedis intact bilat.   MSK: no deformity of the feet CV: no leg edema Skin:  no ulcer on the feet.  normal color and temp on the feet. Neuro: sensation is intact to touch on the feet Ext: There is bilateral onychomycosis of the toenails.     A1c=11.5% Fructosamine converts to an a1c of approx 8.4% Lab Results  Component Value Date   CREATININE 1.41 06/03/2014   BUN 23 06/03/2014   NA 137 06/03/2014   K 4.2 06/03/2014   CL 103 06/03/2014   CO2 27 06/03/2014      Assessment & Plan:  DM: glycemic control is slightly worse, based on fructosamine: this shows much better glycemic control than a1c does.   Renal insufficiency: this is a relative contraindication to metformin.  We discussed--he says he wants to continue metformin,  but we'll need to reduce the dosage.    Patient is advised the following: Patient Instructions  check your blood sugar twice a day.  vary the time of day when you check, between before the 3 meals, and at bedtime.  also check if you have symptoms of your blood sugar being too high or too low.  please keep a record of the readings and bring it to your next appointment here.  You can write it on any piece of paper.  please call us sooner if your blood sugar goes below 70, or if you have a lot of readings over 200.   For now, please continue the insulin, 40 units with breakfast and 30 units with the evening meal.  blood tests are requested for you today.  We'll let you know about the results.   Please come back for a follow-up appointment in 3 months.    addendum: please increase the insulin to 50 units with breakfast and 30 units with the evening meal

## 2014-11-28 LAB — FRUCTOSAMINE: Fructosamine: 391 umol/L — ABNORMAL HIGH (ref 190–270)

## 2014-11-28 MED ORDER — METFORMIN HCL ER 500 MG PO TB24: 500.0000 mg | ORAL_TABLET | Freq: Two times a day (BID) | ORAL | Status: DC

## 2014-12-16 ENCOUNTER — Telehealth: Payer: Self-pay | Admitting: Endocrinology

## 2014-12-16 NOTE — Telephone Encounter (Signed)
Called pt and LMOVM to inform that if pt was not seen at ER then, at minimum an appt should be made at our office with any provider.

## 2014-12-16 NOTE — Telephone Encounter (Signed)
Pt's daughter advised of note below and voiced understanding.  

## 2014-12-16 NOTE — Telephone Encounter (Signed)
Pt needs appt

## 2014-12-16 NOTE — Telephone Encounter (Signed)
See note below and please advise, Thanks! 

## 2014-12-16 NOTE — Telephone Encounter (Signed)
This is a better question for Dr Birdie Riddle, but I think you should to go ER

## 2014-12-16 NOTE — Telephone Encounter (Signed)
Pt is having urination issues, he feels like he really needs to go but nothing is produced

## 2015-01-02 ENCOUNTER — Ambulatory Visit (INDEPENDENT_AMBULATORY_CARE_PROVIDER_SITE_OTHER): Payer: PPO | Admitting: Family Medicine

## 2015-01-02 ENCOUNTER — Encounter: Payer: Self-pay | Admitting: Family Medicine

## 2015-01-02 VITALS — BP 126/72 | HR 62 | Temp 98.0°F | Resp 16 | Ht 68.0 in | Wt 192.5 lb

## 2015-01-02 DIAGNOSIS — R319 Hematuria, unspecified: Secondary | ICD-10-CM

## 2015-01-02 DIAGNOSIS — R358 Other polyuria: Secondary | ICD-10-CM | POA: Diagnosis not present

## 2015-01-02 DIAGNOSIS — R81 Glycosuria: Secondary | ICD-10-CM | POA: Diagnosis not present

## 2015-01-02 DIAGNOSIS — E785 Hyperlipidemia, unspecified: Secondary | ICD-10-CM | POA: Diagnosis not present

## 2015-01-02 DIAGNOSIS — Z23 Encounter for immunization: Secondary | ICD-10-CM

## 2015-01-02 DIAGNOSIS — E1169 Type 2 diabetes mellitus with other specified complication: Secondary | ICD-10-CM | POA: Diagnosis not present

## 2015-01-02 DIAGNOSIS — R3589 Other polyuria: Secondary | ICD-10-CM | POA: Insufficient documentation

## 2015-01-02 DIAGNOSIS — I10 Essential (primary) hypertension: Secondary | ICD-10-CM

## 2015-01-02 LAB — POCT URINALYSIS DIPSTICK
Bilirubin, UA: NEGATIVE
Ketones, UA: NEGATIVE
LEUKOCYTES UA: NEGATIVE
Nitrite, UA: NEGATIVE
Spec Grav, UA: >=1.03
UROBILINOGEN UA: 0.2
pH, UA: 5.5

## 2015-01-02 NOTE — Assessment & Plan Note (Signed)
Chronic problem.  Adequate control.  Asymptomatic.  Check labs.  No anticipated med changes 

## 2015-01-02 NOTE — Assessment & Plan Note (Signed)
New.  I suspect this is due to pt's A1C of 11.5.  Pt had one episode of dysuria 2 weeks ago but this resolved spontaneously by next void.  No hx of hematuria.  No hx of stones.  Will send urine for culture to r/o infxn.  Stressed need for him to control his sugars to improve his frequency.

## 2015-01-02 NOTE — Progress Notes (Signed)
Pre visit review using our clinic review tool, if applicable. No additional management support is needed unless otherwise documented below in the visit note. 

## 2015-01-02 NOTE — Progress Notes (Signed)
   Subjective:    Patient ID: Marcus Weaver, male    DOB: [birth year], 81 y.o.   MRN: [id number]  HPI Urinary issue- pt reports painful urination ~2 weeks ago, 'just one time'.  'i think i passed a stone'.  Pt never saw blood in urine.  + urinary frequency.  No back pain, no suprapubic pain.  No hx of stones.  Hyperlipidemia- chronic problem, on Simvastatin.  Denies abd pain, N/V.  HTN- chronic problem, on Lisinopril, Nifedipine.  No CP, SOB, HAs, visual changes, edema.   Review of Systems For ROS see HPI     Objective:   Physical Exam  Constitutional: He is oriented to person, place, and time. He appears well-developed and well-nourished. No distress.  HENT:  Head: Normocephalic and atraumatic.  Eyes: Conjunctivae and EOM are normal. Pupils are equal, round, and reactive to light.  Neck: Normal range of motion. Neck supple. No thyromegaly present.  Cardiovascular: Normal rate, regular rhythm, normal heart sounds and intact distal pulses.   No murmur heard. Pulmonary/Chest: Effort normal and breath sounds normal. No respiratory distress.  Abdominal: Soft. Bowel sounds are normal. He exhibits no distension. There is no tenderness.  Musculoskeletal: He exhibits no edema.  Lymphadenopathy:    He has no cervical adenopathy.  Neurological: He is alert and oriented to person, place, and time. No cranial nerve deficit.  Skin: Skin is warm and dry.  Psychiatric: He has a normal mood and affect. His behavior is normal.  Vitals reviewed.         Assessment & Plan:

## 2015-01-02 NOTE — Patient Instructions (Signed)
Schedule your complete physical in 6 months We'll notify you of your lab results and make any changes if needed Continue to drink plenty of water Make sure you are eating a low sugar, low carb diet Follow up with Dr Loanne Drilling for the diabetes Call with any questions or concerns Happy Fall!!!

## 2015-01-02 NOTE — Assessment & Plan Note (Signed)
Chronic problem.  Tolerating statin w/o difficulty.  Check labs.  Adjust meds prn  

## 2015-01-03 ENCOUNTER — Encounter: Payer: Self-pay | Admitting: General Practice

## 2015-01-03 LAB — HEPATIC FUNCTION PANEL
ALT: 10 U/L (ref 0–53)
AST: 13 U/L (ref 0–37)
Albumin: 3.6 g/dL (ref 3.5–5.2)
Alkaline Phosphatase: 64 U/L (ref 39–117)
Bilirubin, Direct: 0 mg/dL (ref 0.0–0.3)
Total Bilirubin: 0.3 mg/dL (ref 0.2–1.2)
Total Protein: 7 g/dL (ref 6.0–8.3)

## 2015-01-03 LAB — BASIC METABOLIC PANEL
BUN: 21 mg/dL (ref 6–23)
CO2: 25 mEq/L (ref 19–32)
Calcium: 9.6 mg/dL (ref 8.4–10.5)
Chloride: 109 mEq/L (ref 96–112)
Creatinine, Ser: 1.32 mg/dL (ref 0.40–1.50)
GFR: 66.68 mL/min (ref >60.00)
Glucose, Bld: 60 mg/dL — ABNORMAL LOW (ref 70–99)
Potassium: 4 mEq/L (ref 3.5–5.1)
Sodium: 142 mEq/L (ref 135–145)

## 2015-01-03 LAB — CBC WITH DIFFERENTIAL/PLATELET
BASOS PCT: 0.8 % (ref 0.0–3.0)
Basophils Absolute: 0.1 10*3/uL (ref 0.0–0.1)
EOS ABS: 0.4 10*3/uL (ref 0.0–0.7)
Eosinophils Relative: 4.1 % (ref 0.0–5.0)
HCT: 38.1 % — ABNORMAL LOW (ref 39.0–52.0)
HEMOGLOBIN: 12.6 g/dL — AB (ref 13.0–17.0)
Lymphocytes Relative: 31.3 % (ref 12.0–46.0)
Lymphs Abs: 2.8 10*3/uL (ref 0.7–4.0)
MCHC: 33.2 g/dL (ref 30.0–36.0)
MCV: 91.1 fl (ref 78.0–100.0)
MONO ABS: 0.5 10*3/uL (ref 0.1–1.0)
Monocytes Relative: 6.2 % (ref 3.0–12.0)
Neutro Abs: 5.1 10*3/uL (ref 1.4–7.7)
Neutrophils Relative %: 57.6 % (ref 43.0–77.0)
Platelets: 206 10*3/uL (ref 150.0–400.0)
RBC: 4.18 Mil/uL — ABNORMAL LOW (ref 4.22–5.81)
RDW: 13.2 % (ref 11.5–15.5)
WBC: 8.8 10*3/uL (ref 4.0–10.5)

## 2015-01-03 LAB — LIPID PANEL
Cholesterol: 154 mg/dL (ref 0–200)
HDL: 41.3 mg/dL (ref >39.00)
LDL Cholesterol: 97 mg/dL (ref 0–99)
NonHDL: 113.04
Total CHOL/HDL Ratio: 4
Triglycerides: 81 mg/dL (ref 0.0–149.0)
VLDL: 16.2 mg/dL (ref 0.0–40.0)

## 2015-01-03 LAB — URINE CULTURE
Colony Count: NO GROWTH
Organism ID, Bacteria: NO GROWTH

## 2015-01-06 ENCOUNTER — Encounter: Payer: Self-pay | Admitting: General Practice

## 2015-01-18 ENCOUNTER — Other Ambulatory Visit: Payer: Self-pay | Admitting: Family Medicine

## 2015-01-20 NOTE — Telephone Encounter (Signed)
Medication filled to pharmacy as requested.   

## 2015-02-25 ENCOUNTER — Ambulatory Visit (INDEPENDENT_AMBULATORY_CARE_PROVIDER_SITE_OTHER): Payer: PPO | Admitting: Endocrinology

## 2015-02-25 ENCOUNTER — Encounter: Payer: Self-pay | Admitting: Endocrinology

## 2015-02-25 VITALS — BP 144/92 | HR 94 | Temp 98.0°F | Ht 68.0 in | Wt 192.0 lb

## 2015-02-25 DIAGNOSIS — E1136 Type 2 diabetes mellitus with diabetic cataract: Secondary | ICD-10-CM | POA: Diagnosis not present

## 2015-02-25 LAB — POCT GLYCOSYLATED HEMOGLOBIN (HGB A1C): Hemoglobin A1C: 10.9

## 2015-02-25 MED ORDER — GLUCOSE BLOOD VI STRP: 1.0000 | ORAL_STRIP | Freq: Two times a day (BID) | Status: DC

## 2015-02-25 MED ORDER — INSULIN GLARGINE 100 UNIT/ML SOLOSTAR PEN: 70.0000 [IU] | PEN_INJECTOR | SUBCUTANEOUS | Status: DC

## 2015-02-25 NOTE — Patient Instructions (Addendum)
check your blood sugar twice a day.  vary the time of day when you check, between before the 3 meals, and at bedtime.  also check if you have symptoms of your blood sugar being too high or too low.  please keep a record of the readings and bring it to your next appointment here.  You can write it on any piece of paper.  please call us sooner if your blood sugar goes below 70, or if you have a lot of readings over 200.   i have sent a prescription to your pharmacy, to change your current insulin to lantus, 70 units, in the morning, and none in the evening. Please come back for a follow-up appointment in 2 months.

## 2015-02-25 NOTE — Progress Notes (Signed)
Subjective:    Patient ID: Marcus Weaver, male    DOB: Mar 11, 1931, 81 y.o.   MRN: [id number]  HPI Pt returns for f/u of diabetes mellitus: DM type: Insulin-requiring type 2 Dx'ed: [id number] Complications: renal insufficiency. Therapy: insulin since 2014 (and metformin).   DKA: never.   Severe hypoglycemia: once (2011).   Pancreatitis: never.   Other: he has chosen a bid insulin regimen over multiple daily injections.   Interval history: He says cbg meter is broken.  He says he never misses the insulin.  pt states he feels well in general. Past Medical History  Diagnosis Date  . Hypertension   . Diabetes mellitus     type2  . Hyperlipidemia     No past surgical history on file.  Social History   Social History  . Marital Status: Single    Spouse Name: N/A  . Number of Children: N/A  . Years of Education: N/A   Occupational History  . retired    Social History Main Topics  . Smoking status: Current Some Day Smoker  . Smokeless tobacco: Never Used  . Alcohol Use: No  . Drug Use: No  . Sexual Activity: Not on file   Other Topics Concern  . Not on file   Social History Narrative    Current Outpatient Prescriptions on File Prior to Visit  Medication Sig Dispense Refill  . Alcohol Swabs PADS Use daily for glucose testing. Dx. 250.00 120 each 3  . aspirin 81 MG tablet Take 81 mg by mouth daily.    . B-D ULTRAFINE III SHORT PEN 31G X 8 MM MISC USE AS DIRECTED 100 each 1  . lisinopril (PRINIVIL,ZESTRIL) 20 MG tablet TAKE 1 TABLET BY MOUTH DAILY 90 tablet 0  . metFORMIN (GLUCOPHAGE-XR) 500 MG 24 hr tablet Take 1 tablet (500 mg total) by mouth 2 (two) times daily. 120 tablet 11  . NIFEdipine (PROCARDIA-XL/ADALAT CC) 30 MG 24 hr tablet TAKE 1 TABLET BY MOUTH EVERY DAY 30 tablet 0  . simvastatin (ZOCOR) 20 MG tablet Take 1 tablet (20 mg total) by mouth at bedtime. 90 tablet 1   No current facility-administered medications on file prior to visit.    No Known  Allergies  Family History  Problem Relation Age of Onset  . Cancer Neg Hx   . Diabetes Other     1st degree relative  . Hypertension Other     BP 144/92 mmHg  Pulse 94  Temp(Src) 98 F (36.7 C) (Oral)  Ht 5\' 8"  (1.727 m)  Wt 192 lb (87.091 kg)  BMI 29.20 kg/m2  SpO2 94%  Review of Systems He denies hypoglycemia    Objective:   Physical Exam VITAL SIGNS:  See vs page GENERAL: no distress SKIN:  Insulin injection sites at the anterior abdomen are normal   A1c=10.9%    Assessment & Plan:  DM: ongoing poor control: he needs a simpler regimen.    Patient is advised the following: Patient Instructions  check your blood sugar twice a day.  vary the time of day when you check, between before the 3 meals, and at bedtime.  also check if you have symptoms of your blood sugar being too high or too low.  please keep a record of the readings and bring it to your next appointment here.  You can write it on any piece of paper.  please call us sooner if your blood sugar goes below 70, or if you have a lot of  readings over 200.   i have sent a prescription to your pharmacy, to change your current insulin to lantus, 70 units, in the morning, and none in the evening. Please come back for a follow-up appointment in 2 months.

## 2015-02-27 ENCOUNTER — Other Ambulatory Visit: Payer: Self-pay | Admitting: Family Medicine

## 2015-02-27 NOTE — Telephone Encounter (Signed)
Medication filled to pharmacy as requested.   

## 2015-02-28 ENCOUNTER — Telehealth: Payer: Self-pay | Admitting: Family Medicine

## 2015-02-28 ENCOUNTER — Telehealth: Payer: Self-pay | Admitting: Endocrinology

## 2015-02-28 MED ORDER — NIFEDIPINE ER 30 MG PO TB24: 30.0000 mg | ORAL_TABLET | Freq: Every day | ORAL | Status: DC

## 2015-02-28 NOTE — Telephone Encounter (Signed)
Daughter Bee calling regarding the novolog 70/30 mix, is he to take novolog and lantus together

## 2015-02-28 NOTE — Telephone Encounter (Signed)
I contacted the pt's daughter and advised the pt is only to take 70 units of Lantus in the morning at this time. She voiced understanding.

## 2015-02-28 NOTE — Telephone Encounter (Signed)
Caller name:Beaulah Relation to [phone] Call back number:[phone] Pharmacy:[facility]  Reason for call: pt is needing rx   NIFEdipine (PROCARDIA-XL/ADALAT CC) 30 MG 24 hr tablet

## 2015-02-28 NOTE — Telephone Encounter (Signed)
Medication filled to pharmacy as requested.   

## 2015-03-28 ENCOUNTER — Other Ambulatory Visit: Payer: Self-pay

## 2015-03-28 DIAGNOSIS — F17200 Nicotine dependence, unspecified, uncomplicated: Secondary | ICD-10-CM

## 2015-03-28 MED ORDER — GLUCOSE BLOOD VI STRP: ORAL_STRIP | Status: DC

## 2015-03-28 MED ORDER — ACCU-CHEK MULTICLIX LANCETS MISC: Status: DC

## 2015-03-28 MED ORDER — BAYER CONTOUR NEXT LINK W/DEVICE KIT: PACK | Status: DC

## 2015-03-28 MED ORDER — ACCU-CHEK AVIVA PLUS W/DEVICE KIT: PACK | Status: DC

## 2015-03-28 MED ORDER — FREESTYLE LITE DEVI: Status: DC

## 2015-03-28 MED ORDER — BAYER MICROLET LANCETS MISC: Status: DC

## 2015-03-28 MED ORDER — FREESTYLE LANCETS MISC: Status: DC

## 2015-04-28 ENCOUNTER — Ambulatory Visit: Payer: PPO | Admitting: Endocrinology

## 2015-04-29 ENCOUNTER — Other Ambulatory Visit: Payer: Self-pay

## 2015-04-29 MED ORDER — INSULIN GLARGINE 100 UNIT/ML SOLOSTAR PEN: 70.0000 [IU] | PEN_INJECTOR | SUBCUTANEOUS | Status: DC

## 2015-04-30 ENCOUNTER — Telehealth: Payer: Self-pay | Admitting: Endocrinology

## 2015-04-30 ENCOUNTER — Other Ambulatory Visit: Payer: Self-pay

## 2015-04-30 MED ORDER — ACCU-CHEK MULTICLIX LANCETS MISC: Status: DC

## 2015-04-30 MED ORDER — ALCOHOL SWABS PADS: MEDICATED_PAD | Status: DC

## 2015-04-30 NOTE — Telephone Encounter (Signed)
Please come back for a follow-up appointment in 2 weeks

## 2015-04-30 NOTE — Telephone Encounter (Signed)
Patient no showed today's appt. Please advise on how to follow up. °A. No follow up necessary. °B. Follow up urgent. Contact patient immediately. °C. Follow up necessary. Contact patient and schedule visit in ___ days. °D. Follow up advised. Contact patient and schedule visit in ____weeks. ° °

## 2015-04-30 NOTE — Telephone Encounter (Signed)
Marcus Weaver, Could you contact the pt for a 2 week follow up visit?

## 2015-05-01 ENCOUNTER — Other Ambulatory Visit: Payer: Self-pay

## 2015-05-01 MED ORDER — INSULIN PEN NEEDLE 31G X 8 MM MISC: Status: DC

## 2015-05-01 NOTE — Telephone Encounter (Signed)
Scheduled appointment

## 2015-05-02 ENCOUNTER — Telehealth: Payer: Self-pay | Admitting: Family Medicine

## 2015-05-02 NOTE — Telephone Encounter (Signed)
Ok for referral?

## 2015-05-02 NOTE — Telephone Encounter (Signed)
Relationship to patient: [employer] [employer] Can be reached: [phone]   Reason for call: Calling for a referral for Dr. Sherlynn Stalls @ [facility].  Code [id number] (Appt is on Tuesday 05/06/2015)

## 2015-05-05 ENCOUNTER — Other Ambulatory Visit: Payer: Self-pay | Admitting: Family Medicine

## 2015-05-05 ENCOUNTER — Ambulatory Visit (INDEPENDENT_AMBULATORY_CARE_PROVIDER_SITE_OTHER): Payer: PPO | Admitting: Endocrinology

## 2015-05-05 VITALS — BP 169/92 | HR 111 | Temp 98.5°F | Ht 68.0 in | Wt 202.0 lb

## 2015-05-05 DIAGNOSIS — R200 Anesthesia of skin: Secondary | ICD-10-CM | POA: Insufficient documentation

## 2015-05-05 DIAGNOSIS — E113513 Type 2 diabetes mellitus with proliferative diabetic retinopathy with macular edema, bilateral: Secondary | ICD-10-CM

## 2015-05-05 DIAGNOSIS — E1136 Type 2 diabetes mellitus with diabetic cataract: Secondary | ICD-10-CM | POA: Diagnosis not present

## 2015-05-05 DIAGNOSIS — R208 Other disturbances of skin sensation: Secondary | ICD-10-CM | POA: Diagnosis not present

## 2015-05-05 DIAGNOSIS — H543 Unqualified visual loss, both eyes: Principal | ICD-10-CM

## 2015-05-05 LAB — POCT GLYCOSYLATED HEMOGLOBIN (HGB A1C): Hemoglobin A1C: 8.9

## 2015-05-05 MED ORDER — INSULIN GLARGINE 100 UNIT/ML SOLOSTAR PEN: 80.0000 [IU] | PEN_INJECTOR | SUBCUTANEOUS | Status: DC

## 2015-05-05 NOTE — Telephone Encounter (Signed)
Insurance approved [id number]

## 2015-05-05 NOTE — Telephone Encounter (Signed)
Do I need to call or what needs to happen for him to get to this appt?

## 2015-05-05 NOTE — Telephone Encounter (Signed)
Insurance has not approved, pending auth # [id number]

## 2015-05-05 NOTE — Telephone Encounter (Signed)
No, Dr that he is seeing is not [facility] physicians, Insurance wants him to use [facility] dr. I have requested that since patient is already established, for approval for this visit. Awaiting approval, should know by lunch time

## 2015-05-05 NOTE — Progress Notes (Signed)
Subjective:    Patient ID: Marcus Weaver, male    DOB: 24-Jun-1930, 82 y.o.   MRN: [id number]  HPI Pt returns for f/u of diabetes mellitus: DM type: Insulin-requiring type 2 Dx'ed: 1856 Complications: renal insufficiency. Therapy: insulin since 2014 (and metformin).   DKA: never.   Severe hypoglycemia: once (2011).   Pancreatitis: never.   Other: he was changed to QD insulin, after poor results with bid premixed insulin, and also with multiple daily injections.   Interval history: He has a new meter, but does not check cbg's.   He says he never misses the insulin.   Pt reports moderate numbness of the hands, but no assoc pain.   Past Medical History  Diagnosis Date  . Hypertension   . Diabetes mellitus     type2  . Hyperlipidemia     No past surgical history on file.  Social History   Social History  . Marital Status: Single    Spouse Name: N/A  . Number of Children: N/A  . Years of Education: N/A   Occupational History  . retired    Social History Main Topics  . Smoking status: Current Some Day Smoker  . Smokeless tobacco: Never Used  . Alcohol Use: No  . Drug Use: No  . Sexual Activity: Not on file   Other Topics Concern  . Not on file   Social History Narrative    Current Outpatient Prescriptions on File Prior to Visit  Medication Sig Dispense Refill  . Alcohol Swabs PADS Use daily for glucose testing. Dx. 250.00 120 each 3  . aspirin 81 MG tablet Take 81 mg by mouth daily.    . Blood Glucose Monitoring Suppl (ACCU-CHEK AVIVA PLUS) w/Device KIT Use to check blood sugar 2 times per day. Dx code e11.9 1 kit 1  . glucose blood (ACCU-CHEK AVIVA PLUS) test strip Use to check blood sugar 2 times per day. Dx code E11.9 100 each 2  . Insulin Pen Needle (B-D ULTRAFINE III SHORT PEN) 31G X 8 MM MISC USE AS DIRECTED 100 each 1  . Lancets (ACCU-CHEK MULTICLIX) lancets Use to check blood sugar 2 times per day. Dx code E11.9 100 each 2  . lisinopril (PRINIVIL,ZESTRIL)  20 MG tablet TAKE 1 TABLET BY MOUTH DAILY 90 tablet 0  . metFORMIN (GLUCOPHAGE-XR) 500 MG 24 hr tablet Take 1 tablet (500 mg total) by mouth 2 (two) times daily. 120 tablet 11  . NIFEdipine (PROCARDIA-XL/ADALAT CC) 30 MG 24 hr tablet Take 1 tablet (30 mg total) by mouth daily. 30 tablet 6  . simvastatin (ZOCOR) 20 MG tablet Take 1 tablet (20 mg total) by mouth at bedtime. (Patient not taking: Reported on 05/05/2015) 90 tablet 1   No current facility-administered medications on file prior to visit.    No Known Allergies  Family History  Problem Relation Age of Onset  . Cancer Neg Hx   . Diabetes Other     1st degree relative  . Hypertension Other     BP 169/92 mmHg  Pulse 111  Temp(Src) 98.5 F (36.9 C) (Oral)  Ht '5\' 8"'$  (1.727 m)  Wt 202 lb (91.627 kg)  BMI 30.72 kg/m2  SpO2 92%  Review of Systems He denies hypoglycemia and foot numbness.      Objective:   Physical Exam VITAL SIGNS:  See vs page GENERAL: no distress Neuro: sensation is intact to touch on the hands, but decreased from normal MSK: normal strength throughout the UE's Pulses:  radials are intact bilat.   Lab Results  Component Value Date   HGBA1C 8.9 05/05/2015      Assessment & Plan:  DM: he needs increased rx Numbness, new, ? CTS  Patient is advised the following: Patient Instructions  Please increase the lantus to 80 units each morning. check your blood sugar twice a day.  vary the time of day when you check, between before the 3 meals, and at bedtime.  also check if you have symptoms of your blood sugar being too high or too low.  please keep a record of the readings and bring it to your next appointment here.  You can write it on any piece of paper.  please call us sooner if your blood sugar goes below 70, or if you have a lot of readings over 200.   Please come back for a follow-up appointment in 2 months.  Let's check the nerve endings of the arms.  you will receive a phone call, about a day and  time for an appointment.

## 2015-05-05 NOTE — Patient Instructions (Addendum)
Please increase the lantus to 80 units each morning. check your blood sugar twice a day.  vary the time of day when you check, between before the 3 meals, and at bedtime.  also check if you have symptoms of your blood sugar being too high or too low.  please keep a record of the readings and bring it to your next appointment here.  You can write it on any piece of paper.  please call us sooner if your blood sugar goes below 70, or if you have a lot of readings over 200.   Please come back for a follow-up appointment in 2 months.  Let's check the nerve endings of the arms.  you will receive a phone call, about a day and time for an appointment.

## 2015-05-05 NOTE — Telephone Encounter (Signed)
Order placed Patient has appointment scheduled for 05/06/15

## 2015-05-06 LAB — HM DIABETES EYE EXAM

## 2015-05-13 ENCOUNTER — Other Ambulatory Visit: Payer: Self-pay | Admitting: *Deleted

## 2015-05-13 MED ORDER — INSULIN GLARGINE 100 UNIT/ML SOLOSTAR PEN: PEN_INJECTOR | SUBCUTANEOUS | Status: DC

## 2015-05-15 ENCOUNTER — Emergency Department (HOSPITAL_COMMUNITY): Payer: Commercial Managed Care - HMO

## 2015-05-15 ENCOUNTER — Encounter (HOSPITAL_COMMUNITY): Payer: Self-pay | Admitting: Emergency Medicine

## 2015-05-15 ENCOUNTER — Inpatient Hospital Stay (HOSPITAL_COMMUNITY)
Admission: EM | Admit: 2015-05-15 | Discharge: 2015-05-20 | DRG: 689 | Disposition: A | Discharge status: Skilled Nursing Facility | Payer: Commercial Managed Care - HMO | Attending: Internal Medicine | Admitting: Internal Medicine

## 2015-05-15 ENCOUNTER — Observation Stay (HOSPITAL_COMMUNITY): Payer: Commercial Managed Care - HMO

## 2015-05-15 DIAGNOSIS — G9341 Metabolic encephalopathy: Secondary | ICD-10-CM | POA: Diagnosis present

## 2015-05-15 DIAGNOSIS — Z7982 Long term (current) use of aspirin: Secondary | ICD-10-CM

## 2015-05-15 DIAGNOSIS — E784 Other hyperlipidemia: Secondary | ICD-10-CM | POA: Diagnosis present

## 2015-05-15 DIAGNOSIS — Y9301 Activity, walking, marching and hiking: Secondary | ICD-10-CM | POA: Diagnosis present

## 2015-05-15 DIAGNOSIS — E11649 Type 2 diabetes mellitus with hypoglycemia without coma: Secondary | ICD-10-CM | POA: Diagnosis present

## 2015-05-15 DIAGNOSIS — Z833 Family history of diabetes mellitus: Secondary | ICD-10-CM

## 2015-05-15 DIAGNOSIS — I10 Essential (primary) hypertension: Secondary | ICD-10-CM | POA: Diagnosis present

## 2015-05-15 DIAGNOSIS — F17210 Nicotine dependence, cigarettes, uncomplicated: Secondary | ICD-10-CM | POA: Diagnosis present

## 2015-05-15 DIAGNOSIS — R809 Proteinuria, unspecified: Secondary | ICD-10-CM | POA: Diagnosis present

## 2015-05-15 DIAGNOSIS — N390 Urinary tract infection, site not specified: Principal | ICD-10-CM | POA: Diagnosis present

## 2015-05-15 DIAGNOSIS — N179 Acute kidney failure, unspecified: Secondary | ICD-10-CM | POA: Diagnosis not present

## 2015-05-15 DIAGNOSIS — N183 Chronic kidney disease, stage 3 (moderate): Secondary | ICD-10-CM | POA: Diagnosis present

## 2015-05-15 DIAGNOSIS — W19XXXA Unspecified fall, initial encounter: Secondary | ICD-10-CM | POA: Diagnosis present

## 2015-05-15 DIAGNOSIS — Z794 Long term (current) use of insulin: Secondary | ICD-10-CM

## 2015-05-15 DIAGNOSIS — S62645A Nondisplaced fracture of proximal phalanx of left ring finger, initial encounter for closed fracture: Secondary | ICD-10-CM | POA: Diagnosis present

## 2015-05-15 DIAGNOSIS — R4182 Altered mental status, unspecified: Secondary | ICD-10-CM | POA: Diagnosis not present

## 2015-05-15 DIAGNOSIS — E1165 Type 2 diabetes mellitus with hyperglycemia: Secondary | ICD-10-CM | POA: Diagnosis present

## 2015-05-15 DIAGNOSIS — E1136 Type 2 diabetes mellitus with diabetic cataract: Secondary | ICD-10-CM | POA: Diagnosis not present

## 2015-05-15 DIAGNOSIS — S61412A Laceration without foreign body of left hand, initial encounter: Secondary | ICD-10-CM

## 2015-05-15 DIAGNOSIS — S0081XA Abrasion of other part of head, initial encounter: Secondary | ICD-10-CM | POA: Diagnosis present

## 2015-05-15 DIAGNOSIS — N182 Chronic kidney disease, stage 2 (mild): Secondary | ICD-10-CM | POA: Insufficient documentation

## 2015-05-15 DIAGNOSIS — S0285XA Fracture of orbit, unspecified, initial encounter for closed fracture: Secondary | ICD-10-CM | POA: Diagnosis present

## 2015-05-15 DIAGNOSIS — Z9181 History of falling: Secondary | ICD-10-CM

## 2015-05-15 DIAGNOSIS — R509 Fever, unspecified: Secondary | ICD-10-CM

## 2015-05-15 DIAGNOSIS — E785 Hyperlipidemia, unspecified: Secondary | ICD-10-CM

## 2015-05-15 DIAGNOSIS — S61213A Laceration without foreign body of left middle finger without damage to nail, initial encounter: Secondary | ICD-10-CM | POA: Diagnosis present

## 2015-05-15 DIAGNOSIS — E1122 Type 2 diabetes mellitus with diabetic chronic kidney disease: Secondary | ICD-10-CM | POA: Diagnosis present

## 2015-05-15 DIAGNOSIS — F05 Delirium due to known physiological condition: Secondary | ICD-10-CM | POA: Diagnosis not present

## 2015-05-15 DIAGNOSIS — F0390 Unspecified dementia without behavioral disturbance: Secondary | ICD-10-CM | POA: Diagnosis present

## 2015-05-15 DIAGNOSIS — E1169 Type 2 diabetes mellitus with other specified complication: Secondary | ICD-10-CM | POA: Diagnosis present

## 2015-05-15 DIAGNOSIS — S0282XA Fracture of other specified skull and facial bones, left side, initial encounter for closed fracture: Secondary | ICD-10-CM | POA: Diagnosis present

## 2015-05-15 DIAGNOSIS — N184 Chronic kidney disease, stage 4 (severe): Secondary | ICD-10-CM

## 2015-05-15 DIAGNOSIS — Y92096 Garden or yard of other non-institutional residence as the place of occurrence of the external cause: Secondary | ICD-10-CM

## 2015-05-15 DIAGNOSIS — I129 Hypertensive chronic kidney disease with stage 1 through stage 4 chronic kidney disease, or unspecified chronic kidney disease: Secondary | ICD-10-CM | POA: Diagnosis present

## 2015-05-15 DIAGNOSIS — Z8249 Family history of ischemic heart disease and other diseases of the circulatory system: Secondary | ICD-10-CM

## 2015-05-15 HISTORY — DX: Urinary tract infection, site not specified: N39.0

## 2015-05-15 LAB — URINALYSIS, ROUTINE W REFLEX MICROSCOPIC
Bilirubin Urine: NEGATIVE
GLUCOSE, UA: NEGATIVE mg/dL
KETONES UR: NEGATIVE mg/dL
Leukocytes, UA: NEGATIVE
Nitrite: NEGATIVE
PH: 5.5 (ref 5.0–8.0)
Specific Gravity, Urine: 1.013 (ref 1.005–1.030)

## 2015-05-15 LAB — CBC WITH DIFFERENTIAL/PLATELET
BASOS ABS: 0 10*3/uL (ref 0.0–0.1)
BASOS PCT: 0 %
Eosinophils Absolute: 0.1 10*3/uL (ref 0.0–0.7)
Eosinophils Relative: 1 %
HEMATOCRIT: 42.1 % (ref 39.0–52.0)
HEMOGLOBIN: 14.5 g/dL (ref 13.0–17.0)
Lymphocytes Relative: 14 %
Lymphs Abs: 1.3 10*3/uL (ref 0.7–4.0)
MCH: 30 pg (ref 26.0–34.0)
MCHC: 34.4 g/dL (ref 30.0–36.0)
MCV: 87.2 fL (ref 78.0–100.0)
MONOS PCT: 7 %
Monocytes Absolute: 0.7 10*3/uL (ref 0.1–1.0)
NEUTROS ABS: 7.6 10*3/uL (ref 1.7–7.7)
NEUTROS PCT: 79 %
Platelets: 200 10*3/uL (ref 150–400)
RBC: 4.83 MIL/uL (ref 4.22–5.81)
RDW: 13.5 % (ref 11.5–15.5)
WBC: 9.7 10*3/uL (ref 4.0–10.5)

## 2015-05-15 LAB — RAPID URINE DRUG SCREEN, HOSP PERFORMED
Amphetamines: NOT DETECTED
BARBITURATES: NOT DETECTED
BENZODIAZEPINES: NOT DETECTED
COCAINE: NOT DETECTED
Opiates: NOT DETECTED
TETRAHYDROCANNABINOL: NOT DETECTED

## 2015-05-15 LAB — COMPREHENSIVE METABOLIC PANEL
ALBUMIN: 3.1 g/dL — AB (ref 3.5–5.0)
ALK PHOS: 67 U/L (ref 38–126)
ALT: 15 U/L — AB (ref 17–63)
AST: 32 U/L (ref 15–41)
Anion gap: 10 (ref 5–15)
BILIRUBIN TOTAL: 0.3 mg/dL (ref 0.3–1.2)
BUN: 13 mg/dL (ref 6–20)
CALCIUM: 9.2 mg/dL (ref 8.9–10.3)
CO2: 22 mmol/L (ref 22–32)
Chloride: 110 mmol/L (ref 101–111)
Creatinine, Ser: 1.3 mg/dL — ABNORMAL HIGH (ref 0.61–1.24)
GFR calc Af Amer: 57 mL/min — ABNORMAL LOW (ref >60)
GFR calc non Af Amer: 49 mL/min — ABNORMAL LOW (ref >60)
GLUCOSE: 102 mg/dL — AB (ref 65–99)
Potassium: 4 mmol/L (ref 3.5–5.1)
SODIUM: 142 mmol/L (ref 135–145)
TOTAL PROTEIN: 6.7 g/dL (ref 6.5–8.1)

## 2015-05-15 LAB — GLUCOSE, CAPILLARY
GLUCOSE-CAPILLARY: 126 mg/dL — AB (ref 65–99)
GLUCOSE-CAPILLARY: 125 mg/dL — AB (ref 65–99)

## 2015-05-15 LAB — URINE MICROSCOPIC-ADD ON

## 2015-05-15 LAB — CBG MONITORING, ED
Glucose-Capillary: 58 mg/dL — ABNORMAL LOW (ref 65–99)
Glucose-Capillary: 69 mg/dL (ref 65–99)
Glucose-Capillary: 141 mg/dL — ABNORMAL HIGH (ref 65–99)

## 2015-05-15 LAB — ETHANOL: Alcohol, Ethyl (B): <5 mg/dL (ref <5)

## 2015-05-15 LAB — TSH: TSH: 1.882 u[IU]/mL (ref 0.350–4.500)

## 2015-05-15 LAB — MAGNESIUM: Magnesium: 1.7 mg/dL (ref 1.7–2.4)

## 2015-05-15 LAB — PHOSPHORUS: Phosphorus: 2.1 mg/dL — ABNORMAL LOW (ref 2.5–4.6)

## 2015-05-15 MED ORDER — LIDOCAINE HCL (PF) 1 % IJ SOLN
10.0000 mL | Freq: Once | INTRAMUSCULAR | Status: AC
  Administered 2015-05-15: 10 mL
  Filled 2015-05-15: qty 10

## 2015-05-15 MED ORDER — MAGNESIUM SULFATE 2 GM/50ML IV SOLN
2.0000 g | Freq: Once | INTRAVENOUS | Status: AC
  Administered 2015-05-15: 2 g via INTRAVENOUS
  Filled 2015-05-15: qty 50

## 2015-05-15 MED ORDER — NIFEDIPINE ER OSMOTIC RELEASE 30 MG PO TB24
30.0000 mg | ORAL_TABLET | Freq: Every day | ORAL | Status: DC
  Administered 2015-05-15 – 2015-05-20 (×6): 30 mg via ORAL
  Filled 2015-05-15 (×7): qty 1

## 2015-05-15 MED ORDER — ASPIRIN EC 81 MG PO TBEC
81.0000 mg | DELAYED_RELEASE_TABLET | Freq: Every day | ORAL | Status: DC
  Administered 2015-05-15 – 2015-05-20 (×6): 81 mg via ORAL
  Filled 2015-05-15 (×6): qty 1

## 2015-05-15 MED ORDER — HYDRALAZINE HCL 20 MG/ML IJ SOLN
10.0000 mg | INTRAMUSCULAR | Status: DC | PRN
  Administered 2015-05-15 – 2015-05-16 (×3): 10 mg via INTRAVENOUS
  Filled 2015-05-15 (×3): qty 1

## 2015-05-15 MED ORDER — TRAMADOL HCL 50 MG PO TABS
50.0000 mg | ORAL_TABLET | Freq: Four times a day (QID) | ORAL | Status: DC | PRN
  Administered 2015-05-15: 50 mg via ORAL
  Filled 2015-05-15: qty 1

## 2015-05-15 MED ORDER — INSULIN ASPART 100 UNIT/ML ~~LOC~~ SOLN: 0.0000 [IU] | Freq: Every day | SUBCUTANEOUS | Status: DC

## 2015-05-15 MED ORDER — METOPROLOL TARTRATE 50 MG PO TABS
50.0000 mg | ORAL_TABLET | Freq: Two times a day (BID) | ORAL | Status: DC
  Administered 2015-05-15 – 2015-05-20 (×9): 50 mg via ORAL
  Filled 2015-05-15 (×9): qty 1

## 2015-05-15 MED ORDER — DEXTROSE 5 % IV SOLN
1.0000 g | INTRAVENOUS | Status: DC
  Administered 2015-05-15 – 2015-05-16 (×2): 1 g via INTRAVENOUS
  Filled 2015-05-15 (×3): qty 10

## 2015-05-15 MED ORDER — ENOXAPARIN SODIUM 60 MG/0.6ML ~~LOC~~ SOLN: 50.0000 mg | SUBCUTANEOUS | Status: DC

## 2015-05-15 MED ORDER — METOPROLOL TARTRATE 50 MG PO TABS: 50.0000 mg | ORAL_TABLET | Freq: Two times a day (BID) | ORAL | Status: DC

## 2015-05-15 MED ORDER — SODIUM CHLORIDE 0.9 % IV SOLN
INTRAVENOUS | Status: DC
  Administered 2015-05-15 – 2015-05-20 (×4): via INTRAVENOUS

## 2015-05-15 MED ORDER — ACETAMINOPHEN 325 MG PO TABS
650.0000 mg | ORAL_TABLET | ORAL | Status: DC | PRN
  Administered 2015-05-18 – 2015-05-19 (×2): 650 mg via ORAL
  Filled 2015-05-15 (×2): qty 2

## 2015-05-15 MED ORDER — HYDRALAZINE HCL 25 MG PO TABS
25.0000 mg | ORAL_TABLET | Freq: Four times a day (QID) | ORAL | Status: DC
  Administered 2015-05-15 – 2015-05-17 (×8): 25 mg via ORAL
  Filled 2015-05-15 (×8): qty 1

## 2015-05-15 MED ORDER — INSULIN ASPART 100 UNIT/ML ~~LOC~~ SOLN
0.0000 [IU] | Freq: Three times a day (TID) | SUBCUTANEOUS | Status: DC
  Administered 2015-05-15: 1 [IU] via SUBCUTANEOUS
  Administered 2015-05-16: 5 [IU] via SUBCUTANEOUS
  Administered 2015-05-16: 2 [IU] via SUBCUTANEOUS
  Administered 2015-05-16: 1 [IU] via SUBCUTANEOUS
  Administered 2015-05-18 – 2015-05-19 (×2): 3 [IU] via SUBCUTANEOUS
  Administered 2015-05-19: 2 [IU] via SUBCUTANEOUS
  Administered 2015-05-20: 1 [IU] via SUBCUTANEOUS
  Administered 2015-05-20: 2 [IU] via SUBCUTANEOUS

## 2015-05-15 MED ORDER — LABETALOL HCL 5 MG/ML IV SOLN
20.0000 mg | Freq: Once | INTRAVENOUS | Status: AC
  Administered 2015-05-15: 20 mg via INTRAVENOUS
  Filled 2015-05-15: qty 4

## 2015-05-15 MED ORDER — LISINOPRIL 20 MG PO TABS
20.0000 mg | ORAL_TABLET | Freq: Every day | ORAL | Status: DC
  Administered 2015-05-15 – 2015-05-20 (×6): 20 mg via ORAL
  Filled 2015-05-15 (×6): qty 1

## 2015-05-15 NOTE — ED Notes (Signed)
Ortho paged for ulnar gutter splint for left ring finger.
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
## 2015-05-15 NOTE — ED Notes (Signed)
MD Lacinda Axon aware of elevated BP.
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
## 2015-05-15 NOTE — ED Notes (Signed)
CBG 58-MD Cook aware, crackers and juice given to pt, will recheck.

## 2015-05-15 NOTE — Progress Notes (Signed)
Orthopedic Tech Progress Note Patient Details:  Marcus Weaver Jul 27, 1930 [id number]  Ortho Devices Type of Ortho Device: Ace wrap, Ulna gutter splint Ortho Device/Splint Location: lue Ortho Device/Splint Interventions: Application   Kasey Hansell 05/15/2015, 2:55 PM
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
## 2015-05-15 NOTE — ED Notes (Signed)
Patient transported to X-ray 
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
## 2015-05-15 NOTE — Progress Notes (Signed)
Received from ED 82 YO male W/ Family

## 2015-05-15 NOTE — ED Notes (Signed)
MD at bedside. 

## 2015-05-15 NOTE — Progress Notes (Addendum)
SBP remains > 200 despite home meds- d/w RN to give IV Apresoline x 1 now and will start PO Hydralazine 25 mg q 6 hrs first dose now  1700: Patient has now transferred up to 5W18. Repeat SBP remains elevated at 227 despite a dose of IV hydralazine and initiation of oral hydralazine. I have spoken to the nurse and we will give IV labetalol 20 mg 1 and begin Lopressor 50 mg by mouth twice a day first dose now. If blood pressure remains elevated may need to transfer to stepdown unit for labetalol infusion  1832: Mg low at 1.7 so will give 2 gm IV bolus- phosphorus sightly low at 2.1 but since K normal will hols on supplement and repeat K and phos in am. SBP also has decreased to [place], ANP

## 2015-05-15 NOTE — ED Notes (Signed)
Patient denies pain and is resting comfortably.  

## 2015-05-15 NOTE — ED Notes (Signed)
Ortho at bedside.

## 2015-05-15 NOTE — Progress Notes (Addendum)
Spoke with RN for Dr. Fredna Dow and informed regarding suspicions for finger fracture in setting of traumatic laceration. The M.D. is in surgery and RN will give him information; he will review chart and if indicated will either formally see the patient in consultation or call us back with additional recommendations.  133 pm: RN for Fredna Dow called back- orders to apply finger splint to ulnar gutter left and follow up in their office Monday- call for appointment  Erin Hearing, ANP

## 2015-05-15 NOTE — ED Notes (Signed)
MD at bedside repairing laceration.

## 2015-05-16 DIAGNOSIS — Y9301 Activity, walking, marching and hiking: Secondary | ICD-10-CM | POA: Diagnosis present

## 2015-05-16 DIAGNOSIS — E1165 Type 2 diabetes mellitus with hyperglycemia: Secondary | ICD-10-CM | POA: Diagnosis present

## 2015-05-16 DIAGNOSIS — Z7982 Long term (current) use of aspirin: Secondary | ICD-10-CM | POA: Diagnosis not present

## 2015-05-16 DIAGNOSIS — Y92096 Garden or yard of other non-institutional residence as the place of occurrence of the external cause: Secondary | ICD-10-CM | POA: Diagnosis not present

## 2015-05-16 DIAGNOSIS — E1122 Type 2 diabetes mellitus with diabetic chronic kidney disease: Secondary | ICD-10-CM | POA: Diagnosis present

## 2015-05-16 DIAGNOSIS — I10 Essential (primary) hypertension: Secondary | ICD-10-CM

## 2015-05-16 DIAGNOSIS — S0282XS Fracture of other specified skull and facial bones, left side, sequela: Secondary | ICD-10-CM

## 2015-05-16 DIAGNOSIS — E1136 Type 2 diabetes mellitus with diabetic cataract: Secondary | ICD-10-CM

## 2015-05-16 DIAGNOSIS — Z9181 History of falling: Secondary | ICD-10-CM | POA: Diagnosis not present

## 2015-05-16 DIAGNOSIS — W19XXXA Unspecified fall, initial encounter: Secondary | ICD-10-CM | POA: Diagnosis present

## 2015-05-16 DIAGNOSIS — S0282XA Fracture of other specified skull and facial bones, left side, initial encounter for closed fracture: Secondary | ICD-10-CM | POA: Diagnosis present

## 2015-05-16 DIAGNOSIS — N390 Urinary tract infection, site not specified: Secondary | ICD-10-CM | POA: Diagnosis present

## 2015-05-16 DIAGNOSIS — R410 Disorientation, unspecified: Secondary | ICD-10-CM | POA: Diagnosis not present

## 2015-05-16 DIAGNOSIS — G9341 Metabolic encephalopathy: Secondary | ICD-10-CM | POA: Diagnosis present

## 2015-05-16 DIAGNOSIS — Z794 Long term (current) use of insulin: Secondary | ICD-10-CM | POA: Diagnosis not present

## 2015-05-16 DIAGNOSIS — N182 Chronic kidney disease, stage 2 (mild): Secondary | ICD-10-CM

## 2015-05-16 DIAGNOSIS — Z8249 Family history of ischemic heart disease and other diseases of the circulatory system: Secondary | ICD-10-CM | POA: Diagnosis not present

## 2015-05-16 DIAGNOSIS — Z833 Family history of diabetes mellitus: Secondary | ICD-10-CM | POA: Diagnosis not present

## 2015-05-16 DIAGNOSIS — S61412D Laceration without foreign body of left hand, subsequent encounter: Secondary | ICD-10-CM | POA: Diagnosis not present

## 2015-05-16 DIAGNOSIS — S61213A Laceration without foreign body of left middle finger without damage to nail, initial encounter: Secondary | ICD-10-CM | POA: Diagnosis present

## 2015-05-16 DIAGNOSIS — E1169 Type 2 diabetes mellitus with other specified complication: Secondary | ICD-10-CM

## 2015-05-16 DIAGNOSIS — S0081XA Abrasion of other part of head, initial encounter: Secondary | ICD-10-CM | POA: Diagnosis present

## 2015-05-16 DIAGNOSIS — N183 Chronic kidney disease, stage 3 (moderate): Secondary | ICD-10-CM | POA: Diagnosis not present

## 2015-05-16 DIAGNOSIS — S62645A Nondisplaced fracture of proximal phalanx of left ring finger, initial encounter for closed fracture: Secondary | ICD-10-CM | POA: Diagnosis present

## 2015-05-16 DIAGNOSIS — E784 Other hyperlipidemia: Secondary | ICD-10-CM | POA: Diagnosis present

## 2015-05-16 DIAGNOSIS — E785 Hyperlipidemia, unspecified: Secondary | ICD-10-CM

## 2015-05-16 DIAGNOSIS — F0390 Unspecified dementia without behavioral disturbance: Secondary | ICD-10-CM | POA: Diagnosis present

## 2015-05-16 DIAGNOSIS — R809 Proteinuria, unspecified: Secondary | ICD-10-CM | POA: Diagnosis present

## 2015-05-16 DIAGNOSIS — F05 Delirium due to known physiological condition: Secondary | ICD-10-CM | POA: Diagnosis not present

## 2015-05-16 DIAGNOSIS — E11649 Type 2 diabetes mellitus with hypoglycemia without coma: Secondary | ICD-10-CM | POA: Diagnosis present

## 2015-05-16 DIAGNOSIS — I129 Hypertensive chronic kidney disease with stage 1 through stage 4 chronic kidney disease, or unspecified chronic kidney disease: Secondary | ICD-10-CM | POA: Diagnosis present

## 2015-05-16 DIAGNOSIS — F17210 Nicotine dependence, cigarettes, uncomplicated: Secondary | ICD-10-CM | POA: Diagnosis present

## 2015-05-16 LAB — HEMOGLOBIN A1C
Hgb A1c MFr Bld: 8.7 % — ABNORMAL HIGH (ref 4.8–5.6)
Mean Plasma Glucose: 203 mg/dL

## 2015-05-16 LAB — URINE CULTURE

## 2015-05-16 LAB — BASIC METABOLIC PANEL
Anion gap: 13 (ref 5–15)
BUN: 15 mg/dL (ref 6–20)
CHLORIDE: 108 mmol/L (ref 101–111)
CO2: 18 mmol/L — ABNORMAL LOW (ref 22–32)
Calcium: 9.2 mg/dL (ref 8.9–10.3)
Creatinine, Ser: 1.61 mg/dL — ABNORMAL HIGH (ref 0.61–1.24)
GFR calc Af Amer: 44 mL/min — ABNORMAL LOW (ref >60)
GFR calc non Af Amer: 38 mL/min — ABNORMAL LOW (ref >60)
GLUCOSE: 182 mg/dL — AB (ref 65–99)
POTASSIUM: 4 mmol/L (ref 3.5–5.1)
Sodium: 139 mmol/L (ref 135–145)

## 2015-05-16 LAB — CBC
HEMATOCRIT: 40.7 % (ref 39.0–52.0)
Hemoglobin: 14 g/dL (ref 13.0–17.0)
MCH: 29.4 pg (ref 26.0–34.0)
MCHC: 34.4 g/dL (ref 30.0–36.0)
MCV: 85.5 fL (ref 78.0–100.0)
Platelets: 192 10*3/uL (ref 150–400)
RBC: 4.76 MIL/uL (ref 4.22–5.81)
RDW: 13.6 % (ref 11.5–15.5)
WBC: 12.5 10*3/uL — AB (ref 4.0–10.5)

## 2015-05-16 LAB — PHOSPHORUS: Phosphorus: 2.8 mg/dL (ref 2.5–4.6)

## 2015-05-16 LAB — GLUCOSE, CAPILLARY
GLUCOSE-CAPILLARY: 188 mg/dL — AB (ref 65–99)
GLUCOSE-CAPILLARY: 174 mg/dL — AB (ref 65–99)
Glucose-Capillary: 283 mg/dL — ABNORMAL HIGH (ref 65–99)
Glucose-Capillary: 139 mg/dL — ABNORMAL HIGH (ref 65–99)

## 2015-05-16 LAB — MAGNESIUM: Magnesium: 1.7 mg/dL (ref 1.7–2.4)

## 2015-05-16 MED ORDER — TOBRAMYCIN 0.3 % OP SOLN
1.0000 [drp] | Freq: Four times a day (QID) | OPHTHALMIC | Status: DC
  Administered 2015-05-17 – 2015-05-20 (×12): 1 [drp] via OPHTHALMIC
  Filled 2015-05-16 (×2): qty 5

## 2015-05-16 MED ORDER — INSULIN GLARGINE 100 UNIT/ML ~~LOC~~ SOLN
40.0000 [IU] | Freq: Every day | SUBCUTANEOUS | Status: DC
  Administered 2015-05-16 – 2015-05-20 (×5): 40 [IU] via SUBCUTANEOUS
  Filled 2015-05-16 (×6): qty 0.4

## 2015-05-16 MED ORDER — ENOXAPARIN SODIUM 40 MG/0.4ML ~~LOC~~ SOLN
40.0000 mg | SUBCUTANEOUS | Status: DC
  Administered 2015-05-16 – 2015-05-20 (×5): 40 mg via SUBCUTANEOUS
  Filled 2015-05-16 (×5): qty 0.4

## 2015-05-16 MED ORDER — HALOPERIDOL LACTATE 5 MG/ML IJ SOLN
0.5000 mg | Freq: Four times a day (QID) | INTRAMUSCULAR | Status: DC | PRN
  Administered 2015-05-17: 0.5 mg via INTRAVENOUS
  Filled 2015-05-16: qty 1

## 2015-05-16 MED ORDER — LORAZEPAM 2 MG/ML IJ SOLN
0.5000 mg | INTRAMUSCULAR | Status: DC | PRN
  Administered 2015-05-16 – 2015-05-17 (×3): 0.5 mg via INTRAVENOUS
  Filled 2015-05-16 (×3): qty 1

## 2015-05-16 MED ORDER — QUETIAPINE FUMARATE 25 MG PO TABS
12.5000 mg | ORAL_TABLET | Freq: Two times a day (BID) | ORAL | Status: DC
  Administered 2015-05-16 – 2015-05-17 (×3): 12.5 mg via ORAL
  Filled 2015-05-16 (×3): qty 1

## 2015-05-16 NOTE — Care Management Note (Signed)
Case Management Note  Patient Details  Name: Marcus Weaver MRN: [id number] Date of Birth: [birth year]  Subjective/Objective:                 Spoke with patient's son and brothers in the room this morning. Patient in observation with acute confusion and UTI, on IV Abx. Patient lives with daughter Robinette Haines. He is alone during the day when she is at work. Son states that this is the first time he has been confused like this. Son feels like in his normal state up to this point that was a safe environment for him, and if his confusion is temporary and resolves with IV Abx then he could return home. Patient ordered safety sitter at this time. Family states that patient has a history of dementia, and uses a cane sometimes at home, they deny any problems getting him to the MD or getting his medications. Spoke with family that he will have a PT evaluation, if SNF is recommended it may be beneficial to get a re evaluation if his mentation clears prior to discharge. Family verbalized understanding. Obs form reviewed.   Action/Plan:  CM will continue to follow.  Expected Discharge Date:                  Expected Discharge Plan:  Home/Self Care  In-House Referral:     Discharge planning Services  CM Consult  Post Acute Care Choice:    Choice offered to:  Adult Children  DME Arranged:    DME Agency:     HH Arranged:    HH Agency:     Status of Service:  In process, will continue to follow  Medicare Important Message Given:    Date Medicare IM Given:    Medicare IM give by:    Date Additional Medicare IM Given:    Additional Medicare Important Message give by:     If discussed at [place] of Stay Meetings, dates discussed:    Additional Comments:  Carles Collet, RN 05/16/2015, 11:25 AM

## 2015-05-16 NOTE — Progress Notes (Addendum)
Patient continues to be aggressive with staff. Now not letting up check his blood sugar and vital signs, and swinging at nurse tech. Patient trying to get up from chair. Poor safety awareness. Dr. Cruzita Lederer paged. Awaiting orders  5:44 PM MD paged. Orders received.

## 2015-05-16 NOTE — Care Management Obs Status (Signed)
[place] NOTIFICATION   Patient Details  Name: Marcus Weaver MRN: [id number] Date of Birth: 1930/12/19   Medicare Observation Status Notification Given:  Yes    Carles Collet, RN 05/16/2015, 11:23 AM

## 2015-05-16 NOTE — Evaluation (Signed)
Occupational Therapy Evaluation Patient Details Name: Marcus Weaver MRN: [id number] DOB: 11-22-1930 Today's Date: 05/16/2015    History of Present Illness 82 year old male with history of diabetes mellitus, hypertension, hyperlipidemia, lives at home with daughter, found in neighbor's backyard, confused, disoriented, with no pants on, workup was significant for UTI, CT head with no acute finding, so patient will be admitted for observation for acute encephalopathy secondary to UTI, started on Rocephin, follow on urine cultures.   Clinical Impression   Pt is typically independent in self care and mobility.  Presents with impaired cognition and vision, generalized weakness and poor balance.  He requires +2 assist for mobility and is dependent in all ADL.  Will follow acutely. Recommending SNF for continued rehab and 24 hour assistance at discharge.      Follow Up Recommendations  SNF;Supervision/Assistance - 24 hour    Equipment Recommendations       Recommendations for Other Services       Precautions / Restrictions Precautions Precautions: Fall Restrictions Weight Bearing Restrictions: No      Mobility Bed Mobility               General bed mobility comments: pt in chair  Transfers Overall transfer level: Needs assistance Equipment used: 2 person hand held assist Transfers: Sit to/from Stand Sit to Stand: Mod assist;+2 physical assistance         General transfer comment: with sitter    Balance     Sitting balance-Leahy Scale: Fair       Standing balance-Leahy Scale: Zero                 High Level Balance Comments: Posterior lean, reaching for things not there.            ADL Overall ADL's : Needs assistance/impaired                                       General ADL Comments: Total assist for all ADL.     Vision Additional Comments: Cloudy appearance of R eye, pt with periorbital swelling, unable to open L eye    Perception     Praxis      Pertinent Vitals/Pain Pain Assessment: No/denies pain     Hand Dominance     Extremity/Trunk Assessment     Lower Extremity Assessment Lower Extremity Assessment: Generalized weakness;Difficult to assess due to impaired cognition       Communication Communication Communication: No difficulties   Cognition Arousal/Alertness: Awake/alert Behavior During Therapy: Impulsive;Flat affect;Restless Overall Cognitive Status: Impaired/Different from baseline Area of Impairment: Following commands;Safety/judgement;Orientation;Attention;Memory;Awareness;Problem solving Orientation Level: Disoriented to;Place;Time;Situation Current Attention Level: Focused Memory: Decreased short-term memory Following Commands: Follows one step commands inconsistently;Follows one step commands with increased time Safety/Judgement: Decreased awareness of safety;Decreased awareness of deficits Awareness: Intellectual Problem Solving: Difficulty sequencing;Requires tactile cues;Requires verbal cues     General Comments       Exercises       Shoulder Instructions      Home Living Family/patient expects to be discharged to:: Skilled nursing facility Living Arrangements: Children                               Additional Comments: Lives with daughter who works 1st shift      Prior Functioning/Environment Level of Independence: Independent with assistive device(s)  Comments: Amb with cane most of the time.  Ramped entry to 1 level home.    OT Diagnosis: Generalized weakness;Cognitive deficits;Disturbance of vision   OT Problem List: Decreased strength;Decreased activity tolerance;Impaired balance (sitting and/or standing);Impaired vision/perception;Decreased coordination;Decreased cognition;Decreased safety awareness;Decreased knowledge of use of DME or AE;Impaired UE functional use   OT Treatment/Interventions: Self-care/ADL training;DME and/or  AE instruction;Visual/perceptual remediation/compensation;Patient/family education;Balance training;Cognitive remediation/compensation;Therapeutic activities    OT Goals(Current goals can be found in the care plan section) Acute Rehab OT Goals Patient Stated Goal: To get him clearer OT Goal Formulation: Patient unable to participate in goal setting Time For Goal Achievement: 05/30/15 Potential to Achieve Goals: Fair ADL Goals Pt Will Perform Eating: with supervision;sitting;with set-up Pt Will Perform Grooming: with set-up;with supervision;sitting Pt Will Perform Upper Body Bathing: with min assist;sitting Pt Will Perform Lower Body Bathing: with min assist;sit to/from stand Pt Will Transfer to Toilet: with min assist;ambulating;bedside commode Additional ADL Goal #1: Pt will follow one step commands 75% of time.  OT Frequency: Min 2X/week   Barriers to D/C: Decreased caregiver support          Co-evaluation              End of Session Equipment Utilized During Treatment: Gait belt Nurse Communication: Mobility status  Activity Tolerance: Patient tolerated treatment well Patient left: in chair;with call bell/phone within reach;with nursing/sitter in room;with family/visitor present (son asleep on bed)   Time: [id number] OT Time Calculation (min): 17 min Charges:  OT General Charges $OT Visit: 1 Procedure OT Evaluation $OT Eval Moderate Complexity: 1 Procedure G-Codes:    Malka So 05/16/2015, 4:23 PM

## 2015-05-16 NOTE — Progress Notes (Addendum)
Inpatient Diabetes Program Recommendations  AACE/ADA: New Consensus Statement on Inpatient Glycemic Control (2015)  Target Ranges:  Prepandial:   less than 140 mg/dL      Peak postprandial:   less than 180 mg/dL (1-2 hours)      Critically ill patients:  140 - 180 mg/dL   Review of Glycemic Control  Diabetes history: DM 2 Outpatient Diabetes medications: Lantus 80 units daily, Metformin 500 bid. Current orders for Inpatient glycemic control: Sensitive correction tidwc and HS correction scales  Inpatient Diabetes Program Recommendations:    Concern regarding patient's home regimen for diabetes control.  Lantus at 70 units in the am and 80 units in the pm totals 150 units per day. This dose is almost double his body weight in kg. This is a large dose of basal insulin especially in state of chronic kidney disease. Patient may have been having  low blood sugars from the lantus in the evenings. Noted patient also on metformin 500 mg bid. GFR is low at 44. Concern that these medications may play a part in patient's mental status.  Thank you Rosita Kea, RN, MSN, CDE  Diabetes Inpatient Program Office: [phone] Pager: [phone] 8:00 am to 5:00 pm

## 2015-05-16 NOTE — Evaluation (Signed)
Physical Therapy Evaluation Patient Details Name: Marcus Weaver MRN: [id number] DOB: November 21, 1930 Today's Date: 05/16/2015   History of Present Illness  82 year old male with history of diabetes mellitus, hypertension, hyperlipidemia, lives at home with daughter, found in neighbor's backyard, confused, disoriented, with no pants on, workup was significant for UTI, CT head with no acute finding, so patient will be admitted for observation for acute encephalopathy secondary to UTI, started on Rocephin, follow on urine cultures.  Clinical Impression  Pt admitted with above diagnosis. Pt currently with functional limitations due to the deficits listed below (see PT Problem List).  Pt will benefit from skilled PT to increase their independence and safety with mobility to allow discharge to the venue listed below.  Pt with difficulty following instructions during PT session with decreased vision impacting session as well. Pt requiring +2 physical A due to unsteadiness and cognitive deficits.  Prior to admission, pt was ambulatory with a cane.  Recommend SNF at this time.  Will continue to assess dc plan.     Follow Up Recommendations SNF;Supervision for mobility/OOB;Supervision/Assistance - 24 hour    Equipment Recommendations  None recommended by PT    Recommendations for Other Services       Precautions / Restrictions Precautions Precautions: Fall Restrictions Weight Bearing Restrictions: No      Mobility  Bed Mobility Overal bed mobility: Needs Assistance Bed Mobility: Rolling;Supine to Sit;Sit to Supine Rolling: Mod assist   Supine to sit: Mod assist Sit to supine: Min assist   General bed mobility comments: A level due to pt not able to follow instructions plus L hand injury  Transfers Overall transfer level: Needs assistance Equipment used: 2 person hand held assist Transfers: Sit to/from [facility] Sit to Stand: Mod assist;+2 physical assistance Stand pivot  transfers: Mod assist;+2 physical assistance       General transfer comment: Attempted to have pt stand and ambulate around bed holding IV pole, but pt unable to follow directions and reaching for things not there.  Returned to bed and rolled to other side for SPT to recliner.  Son  present and A with transfer for 2 person HHA.  Very unsteady on his feet.  Ambulation/Gait             General Gait Details: unable  Stairs            Wheelchair Mobility    Modified Rankin (Stroke Patients Only)       Balance Overall balance assessment: Needs assistance;History of Falls   Sitting balance-Leahy Scale: Fair   Postural control: Posterior lean Standing balance support: Bilateral upper extremity supported Standing balance-Leahy Scale: Zero Standing balance comment: 2 person HHA.  Pt fidgety.               High Level Balance Comments: Posterior lean, reaching for things not there.             Pertinent Vitals/Pain Pain Assessment: Faces Faces Pain Scale: Hurts a little bit Pain Location: toes Pain Intervention(s): Monitored during session    [place] expects to be discharged to:: Skilled nursing facility Living Arrangements: Children               Additional Comments: Lives with daughter who works 1st shift    Prior Function Level of Independence: Independent with assistive device(s)         Comments: Amb with cane most of the time.  Ramped entry to 1 level home.     Hand  Dominance        Extremity/Trunk Assessment   Upper Extremity Assessment: Defer to OT evaluation           Lower Extremity Assessment: Generalized weakness         Communication   Communication: No difficulties  Cognition Arousal/Alertness: Awake/alert Behavior During Therapy: Impulsive Overall Cognitive Status: Impaired/Different from baseline Area of Impairment: Following  commands;Safety/judgement;Orientation;Attention;Memory;Awareness;Problem solving Orientation Level: Disoriented to;Place;Time;Situation Current Attention Level: Focused Memory: Decreased short-term memory Following Commands: Follows one step commands inconsistently;Follows one step commands with increased time Safety/Judgement: Decreased awareness of safety;Decreased awareness of deficits Awareness: Intellectual Problem Solving: Difficulty sequencing;Requires tactile cues;Requires verbal cues General Comments: Son reports this is a big change for pt. Pt reaching for things and not able to follow commands well, but when placed gait belt around him, he was able to recall using one with his wife.  Aware it is his birthday.    General Comments General comments (skin integrity, edema, etc.): Laceration over L eye.  R eye cloudy. Son reports L eye has better vision, but due to swelling not able to see well now.      Exercises        Assessment/Plan    PT Assessment Patient needs continued PT services  PT Diagnosis Difficulty walking   PT Problem List Decreased balance;Decreased mobility;Decreased cognition;Decreased safety awareness  PT Treatment Interventions DME instruction;Gait training;Functional mobility training;Therapeutic activities;Therapeutic exercise;Patient/family education;Balance training;Neuromuscular re-education   PT Goals (Current goals can be found in the Care Plan section) Acute Rehab PT Goals Patient Stated Goal: To get him clearer PT Goal Formulation: With family Time For Goal Achievement: 05/30/15 Potential to Achieve Goals: Good    Frequency Min 3X/week   Barriers to discharge Decreased caregiver support Lives with daughter who works first shift    Co-evaluation               End of Session Equipment Utilized During Treatment: Gait belt Activity Tolerance: Patient tolerated treatment well Patient left: in chair;with call bell/phone within reach;with  chair alarm set;with family/visitor present Nurse Communication: Mobility status    Functional Assessment Tool Used: clnical judgement and objective findings Functional Limitation: Mobility: Walking and moving around Mobility: Walking and Moving Around Current Status [phone]): At least 40 percent but less than 60 percent impaired, limited or restricted Mobility: Walking and Moving Around Goal Status [phone]): At least 1 percent but less than 20 percent impaired, limited or restricted    Time: 0850-0913 PT Time Calculation (min) (ACUTE ONLY): 23 min   Charges:   PT Evaluation $PT Eval High Complexity: 1 Procedure PT Treatments $Therapeutic Activity: 8-22 mins   PT G Codes:   PT G-Codes **NOT FOR INPATIENT CLASS** Functional Assessment Tool Used: clnical judgement and objective findings Functional Limitation: Mobility: Walking and moving around Mobility: Walking and Moving Around Current Status [id number]): At least 40 percent but less than 60 percent impaired, limited or restricted Mobility: Walking and Moving Around Goal Status [phone]): At least 1 percent but less than 20 percent impaired, limited or restricted    [facility] 05/16/2015, 9:31 AM

## 2015-05-16 NOTE — Progress Notes (Signed)
PROGRESS NOTE  Marcus Weaver [id number] DOB: 02-13-1931 DOA: 05/15/2015 PCP: Annye Asa, MD Outpatient Specialists:    LOS: 0 days   Brief Narrative: 82 year old male with history of diabetes mellitus, hypertension, hyperlipidemia, lives at home with daughter, found in neighbor's backyard, confused, disoriented, with no pants on, workup was significant for UTI.  Assessment & Plan: Active Problems:   Diabetes mellitus, type II ([place])   Hyperlipidemia associated with type 2 diabetes mellitus ([place])   Uncontrolled hypertension   Acute UTI   Transient disorientation   Fall   Left orbit fracture ([facility])   Laceration of left hand/mid finger   CKD (chronic kidney disease), stage II   UTI (urinary tract infection)   Urinary tract infection - Likely contributed to his altered mental status and confusion, patient started on ceftriaxone empirically, follow urine cultures and narrow antibiotics appropriately as indicated - Renal ultrasound without acute findings  Acute encephalopathy with in-hospital delirium  - Likely in the setting of #1, I discussed with his daughter today over the phone, patient has been having intermittent episodes of confusion for the past several years, she thinks he is starting to show signs of dementia - Patient very agitated this morning, confused, appears to be hallucinating, this is likely due to infection as above as well as in hospital-induced delirium in a patient with underlying dementia.  We'll start low-dose Ativan for agitation as well as Seroquel.-  - TSH normal  Fall/Left orbit fracture /Laceration of left hand/mid finger - Patient reports typically ambulates with a cane and left the home unassisted without a cane and apparently disoriented so suspect this contributed to patient's falls - EDP discussed with on-call ENT physician [facility]) he states this is an outpatient follow-up-will need to have outpatient follow-up scheduled prior to  discharge. I have asked for an appointment  - X-ray left hand concerning for a nondisplaced fracture at the base of the ring finger proximal phalanx. Admission MD did discuss with Dr. Fredna Dow from hand surgery, recommended to apply finger splint and follow-up in their office next week. I've asked for an appointment. Continue splint until then. Laceration sutured by the ED physician - Tylenol and Ultram for pain - PT/OT evaluation  Diabetes mellitus, type II  - Patient reports recently CBGs have been running higher than usual although he also describes CBGs as being "up and down" - This week instructed by Dr. Loanne Drilling to increase Lantus to 80 units every morning, patient does not appear to be on any mealtime coverage - CBG dropped to 58 in the ED, his Lantus was originally held, his CBG are starting to creep up today, I will resume his Lantus at a lower dose of 40 units per day.  Uncontrolled hypertension - Systolic blood pressure elevated although patient has not had his typical a.m. Medications - Continue Procardia and lisinopril  CKD III - Creatinine stable and at baseline around 1.3, repeat renal function tomorrow morning  - Follow for episodic hypoglycemia with concomitant use of metformin with low GFR  Hyperlipidemia associated with type 2 diabetes mellitus  - Continue statin   DVT prophylaxis: Lovenox  Code Status: Full code  Family Communication: Discussed with his son at bedside as well as his daughter over the phone  Disposition Plan: Home versus skilled when ready, PT to evaluate  Barriers for discharge: Acute encephalopathy, urinary tract infection, IV antibiotics   Consultants:   None   Procedures:   None   Antimicrobials:  Ceftriaxone 2/23 >>  Subjective: - Patient delirious this morning, appears to be picking stuff up in the air, tries to climb out of bed and doesn't make much sense. Appears agitated.   Objective: Filed Vitals:   05/16/15 0511 05/16/15 1008  05/16/15 1017 05/16/15 1300  BP: 168/70  168/100 144/125  Pulse: 98   97  Temp: 98.8 F (37.1 C) 98.6 F (37 C)  98.4 F (36.9 C)  TempSrc: Oral Oral  Oral  Resp: 20     Height:      Weight:      SpO2: 100%   100%    Intake/Output Summary (Last 24 hours) at 05/16/15 1413 Last data filed at 05/16/15 [id number]  Gross per 24 hour  Intake 551.17 ml  Output    350 ml  Net 201.17 ml   Filed Weights   05/15/15 1700  Weight: 89.495 kg (197 lb 4.8 oz)    Examination: BP 144/125 mmHg  Pulse 97  Temp(Src) 98.4 F (36.9 C) (Oral)  Resp 20  Ht 5\' 8"  (1.727 m)  Wt 89.495 kg (197 lb 4.8 oz)  BMI 30.01 kg/m2  SpO2 100% General exam: NAD Respiratory system: Patient grabs my stethoscope and will not let me examine him Central nervous system: AxOx1 to person only. No apparent focal deficits, he moves all 4 extremities   Data Reviewed: I have personally reviewed following labs and imaging studies  CBC:  Recent Labs Lab 05/15/15 0644 05/16/15 0717  WBC 9.7 12.5*  NEUTROABS 7.6  --   HGB 14.5 14.0  HCT 42.1 40.7  MCV 87.2 85.5  PLT 200 [id number]   Basic Metabolic Panel:  Recent Labs Lab 05/15/15 0644 05/15/15 1628 05/16/15 0717  NA 142  --  139  K 4.0  --  4.0  CL 110  --  108  CO2 22  --  18*  GLUCOSE 102*  --  182*  BUN 13  --  15  CREATININE 1.30*  --  1.61*  CALCIUM 9.2  --  9.2  MG  --  1.7 1.7  PHOS  --  2.1* 2.8   Liver Function Tests:  Recent Labs Lab 05/15/15 0644  AST 32  ALT 15*  ALKPHOS 67  BILITOT 0.3  PROT 6.7  ALBUMIN 3.1*   HbA1C:  Recent Labs  05/15/15 1012  HGBA1C 8.7*   CBG:  Recent Labs Lab 05/15/15 1204 05/15/15 1717 05/15/15 2306 05/16/15 0755 05/16/15 1255  GLUCAP 141* 126* 125* 188* 283*   Thyroid Function Tests:  Recent Labs  05/15/15 1628  TSH 1.882   Urine analysis:    Component Value Date/Time   COLORURINE YELLOW 05/15/2015 0620   APPEARANCEUR CLOUDY* 05/15/2015 0620   LABSPEC 1.013 05/15/2015 0620    PHURINE 5.5 05/15/2015 0620   GLUCOSEU NEGATIVE 05/15/2015 0620   HGBUR LARGE* 05/15/2015 0620   BILIRUBINUR NEGATIVE 05/15/2015 0620   BILIRUBINUR negative 01/02/2015 1407   KETONESUR NEGATIVE 05/15/2015 0620   PROTEINUR >300* 05/15/2015 0620   PROTEINUR 3+ 01/02/2015 1407   UROBILINOGEN 0.2 01/02/2015 1407   UROBILINOGEN 1.0 07/29/2009 0549   NITRITE NEGATIVE 05/15/2015 0620   NITRITE negative 01/02/2015 1407   LEUKOCYTESUR NEGATIVE 05/15/2015 0620   Radiology Studies: Ct Head Wo Contrast  05/15/2015  CLINICAL DATA:  Recent fall.  Facial abrasions.  Left eye swelling. EXAM: CT HEAD WITHOUT CONTRAST CT MAXILLOFACIAL WITHOUT CONTRAST CT CERVICAL SPINE WITHOUT CONTRAST TECHNIQUE: Multidetector CT imaging of the head, cervical spine, and maxillofacial structures were performed using the  standard protocol without intravenous contrast. Multiplanar CT image reconstructions of the cervical spine and maxillofacial structures were also generated. COMPARISON:  07/29/2009 FINDINGS: CT HEAD FINDINGS Stable cerebral atrophy with prominent ventricles. Increased periventricular low-density compared to the previous examination. No evidence for acute hemorrhage, mass lesion, midline shift, hydrocephalus or large infarct. Large scalp hematoma along the left forehead. There is extensive left periorbital swelling. Mild mucosal disease in the bilateral maxillary sinuses. There is an old deformity fracture involving the right medial orbital wall. There is a new depressed fracture involving the left medial orbital wall with fluid in the left ethmoid air cells. Mandibular condyles are located. CT MAXILLOFACIAL FINDINGS Large left frontal scalp hematoma. There is extensive soft tissue swelling around the left orbit. Both globes are intact. Mandible is intact. Zygomatic arches are intact. Nasal bones are intact. There is a new fracture involving the left lamina papyracea/ medial orbital wall. There is depression of this  fracture towards the midline. There is fluid or blood in the left ethmoid air cells extending into the left maxillary sinus. Mild mucosal disease in the bilateral maxillary sinuses. Pterygoid plates are intact. Mastoid air cells are aerated. Overall, the left inferior orbital wall appears intact but difficult to exclude injury along the medial aspect of the inferior left orbital wall. There is no evidence for extraocular muscle entrapment. Patient has 1 remaining tooth. This tooth has periapical lucency and a caries. Crista galli is midline and intact. Alignment of the upper cervical spine is normal with degenerative endplate changes. CT CERVICAL SPINE FINDINGS No gross abnormality to the thyroid tissue. There is no significant soft tissue swelling in the neck. No gross abnormality to the submandibular glands or parotid glands. Small lymph nodes on both sides of the neck. Negative for an acute fracture or dislocation in the cervical spine. Negative for a large apical pneumothorax. Broad-based disc bulge with bilateral uncovertebral spurring at C3-C4 causing bilateral foraminal narrowing. Extensive uncovertebral spurring at C4-C5 causing foraminal narrowing. Extensive left foraminal narrowing at C6-C7 due to uncovertebral spurring. Alignment of cervical spine is within normal limits. Multilevel degenerative endplate and disc disease. Marked disc space narrowing at C7-T1. Opacification of the posterior longitudinal ligament. IMPRESSION: Head CT:  No acute intracranial abnormality. Atrophy and evidence for chronic small vessel ischemic changes. Facial CT: Fracture of the medial left orbital wall/ lamina papyracea. Small amount of fluid and blood in the left ethmoid air cells and left maxillary sinus. Large scalp hematoma involving the left forehead. Extensive soft tissue swelling around the left orbit. Cervical spine CT: Multilevel degenerative changes in the cervical spine. No acute bone abnormality in cervical spine.  Electronically Signed   By: Markus Daft M.D.   On: 05/15/2015 07:49   Ct Cervical Spine Wo Contrast  05/15/2015  CLINICAL DATA:  Recent fall.  Facial abrasions.  Left eye swelling. EXAM: CT HEAD WITHOUT CONTRAST CT MAXILLOFACIAL WITHOUT CONTRAST CT CERVICAL SPINE WITHOUT CONTRAST TECHNIQUE: Multidetector CT imaging of the head, cervical spine, and maxillofacial structures were performed using the standard protocol without intravenous contrast. Multiplanar CT image reconstructions of the cervical spine and maxillofacial structures were also generated. COMPARISON:  07/29/2009 FINDINGS: CT HEAD FINDINGS Stable cerebral atrophy with prominent ventricles. Increased periventricular low-density compared to the previous examination. No evidence for acute hemorrhage, mass lesion, midline shift, hydrocephalus or large infarct. Large scalp hematoma along the left forehead. There is extensive left periorbital swelling. Mild mucosal disease in the bilateral maxillary sinuses. There is an old deformity fracture involving  the right medial orbital wall. There is a new depressed fracture involving the left medial orbital wall with fluid in the left ethmoid air cells. Mandibular condyles are located. CT MAXILLOFACIAL FINDINGS Large left frontal scalp hematoma. There is extensive soft tissue swelling around the left orbit. Both globes are intact. Mandible is intact. Zygomatic arches are intact. Nasal bones are intact. There is a new fracture involving the left lamina papyracea/ medial orbital wall. There is depression of this fracture towards the midline. There is fluid or blood in the left ethmoid air cells extending into the left maxillary sinus. Mild mucosal disease in the bilateral maxillary sinuses. Pterygoid plates are intact. Mastoid air cells are aerated. Overall, the left inferior orbital wall appears intact but difficult to exclude injury along the medial aspect of the inferior left orbital wall. There is no evidence for  extraocular muscle entrapment. Patient has 1 remaining tooth. This tooth has periapical lucency and a caries. Crista galli is midline and intact. Alignment of the upper cervical spine is normal with degenerative endplate changes. CT CERVICAL SPINE FINDINGS No gross abnormality to the thyroid tissue. There is no significant soft tissue swelling in the neck. No gross abnormality to the submandibular glands or parotid glands. Small lymph nodes on both sides of the neck. Negative for an acute fracture or dislocation in the cervical spine. Negative for a large apical pneumothorax. Broad-based disc bulge with bilateral uncovertebral spurring at C3-C4 causing bilateral foraminal narrowing. Extensive uncovertebral spurring at C4-C5 causing foraminal narrowing. Extensive left foraminal narrowing at C6-C7 due to uncovertebral spurring. Alignment of cervical spine is within normal limits. Multilevel degenerative endplate and disc disease. Marked disc space narrowing at C7-T1. Opacification of the posterior longitudinal ligament. IMPRESSION: Head CT:  No acute intracranial abnormality. Atrophy and evidence for chronic small vessel ischemic changes. Facial CT: Fracture of the medial left orbital wall/ lamina papyracea. Small amount of fluid and blood in the left ethmoid air cells and left maxillary sinus. Large scalp hematoma involving the left forehead. Extensive soft tissue swelling around the left orbit. Cervical spine CT: Multilevel degenerative changes in the cervical spine. No acute bone abnormality in cervical spine. Electronically Signed   By: Markus Daft M.D.   On: 05/15/2015 07:49   US Renal  05/15/2015  CLINICAL DATA:  Acute kidney injury EXAM: RENAL / URINARY TRACT ULTRASOUND COMPLETE COMPARISON:  None. FINDINGS: Right Kidney: Length: 9 cm without definitive cortical thinning. Echogenicity within normal limits. No mass or hydronephrosis visualized. Left Kidney: Length: 10 cm. Echogenicity within normal limits. No  mass or hydronephrosis visualized. Bladder: No over distention or wall thickening. Marked enlargement of the prostate, measuring at least 6.4 cm. IMPRESSION: 1. No acute finding or hydronephrosis. 2. Marked prostate enlargement without urinary retention. Electronically Signed   By: Monte Fantasia M.D.   On: 05/15/2015 11:16   Dg Hand Complete Left  05/15/2015  CLINICAL DATA:  [place] this morning. Cuts on the second, third and fourth fingers. EXAM: LEFT HAND - COMPLETE 3+ VIEW COMPARISON:  None. FINDINGS: Lucency and mild cortical irregularity involving the base of the index finger proximal phalanx. This is concerning for a nondisplaced fracture. This presumed fracture does involve the MCP joint. Mild cortical irregularity involving the index finger proximal phalanx near the PIP joint but there is not a definite fracture at this location. Mild degenerative changes in the DIP joints, particularly at the middle finger. Degenerative changes at the first carpometacarpal joint. Chondrocalcinosis along the ulnar aspect of the wrist  joint. IMPRESSION: Concern for a nondisplaced fracture at the base of the ring finger proximal phalanx. Recommend clinical correlation in this area. Degenerative changes as described. Electronically Signed   By: Markus Daft M.D.   On: 05/15/2015 07:10   Ct Maxillofacial Wo Cm  05/15/2015  CLINICAL DATA:  Recent fall.  Facial abrasions.  Left eye swelling. EXAM: CT HEAD WITHOUT CONTRAST CT MAXILLOFACIAL WITHOUT CONTRAST CT CERVICAL SPINE WITHOUT CONTRAST TECHNIQUE: Multidetector CT imaging of the head, cervical spine, and maxillofacial structures were performed using the standard protocol without intravenous contrast. Multiplanar CT image reconstructions of the cervical spine and maxillofacial structures were also generated. COMPARISON:  07/29/2009 FINDINGS: CT HEAD FINDINGS Stable cerebral atrophy with prominent ventricles. Increased periventricular low-density compared to the previous  examination. No evidence for acute hemorrhage, mass lesion, midline shift, hydrocephalus or large infarct. Large scalp hematoma along the left forehead. There is extensive left periorbital swelling. Mild mucosal disease in the bilateral maxillary sinuses. There is an old deformity fracture involving the right medial orbital wall. There is a new depressed fracture involving the left medial orbital wall with fluid in the left ethmoid air cells. Mandibular condyles are located. CT MAXILLOFACIAL FINDINGS Large left frontal scalp hematoma. There is extensive soft tissue swelling around the left orbit. Both globes are intact. Mandible is intact. Zygomatic arches are intact. Nasal bones are intact. There is a new fracture involving the left lamina papyracea/ medial orbital wall. There is depression of this fracture towards the midline. There is fluid or blood in the left ethmoid air cells extending into the left maxillary sinus. Mild mucosal disease in the bilateral maxillary sinuses. Pterygoid plates are intact. Mastoid air cells are aerated. Overall, the left inferior orbital wall appears intact but difficult to exclude injury along the medial aspect of the inferior left orbital wall. There is no evidence for extraocular muscle entrapment. Patient has 1 remaining tooth. This tooth has periapical lucency and a caries. Crista galli is midline and intact. Alignment of the upper cervical spine is normal with degenerative endplate changes. CT CERVICAL SPINE FINDINGS No gross abnormality to the thyroid tissue. There is no significant soft tissue swelling in the neck. No gross abnormality to the submandibular glands or parotid glands. Small lymph nodes on both sides of the neck. Negative for an acute fracture or dislocation in the cervical spine. Negative for a large apical pneumothorax. Broad-based disc bulge with bilateral uncovertebral spurring at C3-C4 causing bilateral foraminal narrowing. Extensive uncovertebral spurring  at C4-C5 causing foraminal narrowing. Extensive left foraminal narrowing at C6-C7 due to uncovertebral spurring. Alignment of cervical spine is within normal limits. Multilevel degenerative endplate and disc disease. Marked disc space narrowing at C7-T1. Opacification of the posterior longitudinal ligament. IMPRESSION: Head CT:  No acute intracranial abnormality. Atrophy and evidence for chronic small vessel ischemic changes. Facial CT: Fracture of the medial left orbital wall/ lamina papyracea. Small amount of fluid and blood in the left ethmoid air cells and left maxillary sinus. Large scalp hematoma involving the left forehead. Extensive soft tissue swelling around the left orbit. Cervical spine CT: Multilevel degenerative changes in the cervical spine. No acute bone abnormality in cervical spine. Electronically Signed   By: Markus Daft M.D.   On: 05/15/2015 07:49     Scheduled Meds: . aspirin EC  81 mg Oral Daily  . cefTRIAXone (ROCEPHIN)  IV  1 g Intravenous Q24H  . enoxaparin (LOVENOX) injection  40 mg Subcutaneous Q24H  . hydrALAZINE  25 mg Oral  4 times per day  . insulin aspart  0-5 Units Subcutaneous QHS  . insulin aspart  0-9 Units Subcutaneous TID WC  . lisinopril  20 mg Oral Daily  . metoprolol tartrate  50 mg Oral BID  . NIFEdipine  30 mg Oral Daily  . QUEtiapine  12.5 mg Oral BID   Continuous Infusions: . sodium chloride 10 mL/hr at 05/15/15 1847   Time spent: 25 minutes, more than 50% bedside discussing with patient's son and answering multiple questions.    Marzetta Board, MD, PhD [facility] Pager [phone]  If 7PM-7AM, please contact night-coverage [email or web address] Password [facility] 05/16/2015, 2:13 PM

## 2015-05-16 NOTE — Progress Notes (Addendum)
Patient pulling at lines and not letting RN take VS. Safety mitts on and patient biting at them. Patient confused and is at risk for self harm. MD notified. Orders placed

## 2015-05-17 LAB — BASIC METABOLIC PANEL
Anion gap: 10 (ref 5–15)
BUN: 22 mg/dL — AB (ref 6–20)
CHLORIDE: 108 mmol/L (ref 101–111)
CO2: 22 mmol/L (ref 22–32)
Calcium: 9 mg/dL (ref 8.9–10.3)
Creatinine, Ser: 1.67 mg/dL — ABNORMAL HIGH (ref 0.61–1.24)
GFR calc Af Amer: 42 mL/min — ABNORMAL LOW (ref >60)
GFR, EST NON AFRICAN AMERICAN: 36 mL/min — AB (ref >60)
GLUCOSE: 104 mg/dL — AB (ref 65–99)
POTASSIUM: 3.7 mmol/L (ref 3.5–5.1)
Sodium: 140 mmol/L (ref 135–145)

## 2015-05-17 LAB — CBC
HEMATOCRIT: 36.7 % — AB (ref 39.0–52.0)
HEMOGLOBIN: 12.8 g/dL — AB (ref 13.0–17.0)
MCH: 30.1 pg (ref 26.0–34.0)
MCHC: 34.9 g/dL (ref 30.0–36.0)
MCV: 86.4 fL (ref 78.0–100.0)
Platelets: 183 10*3/uL (ref 150–400)
RBC: 4.25 MIL/uL (ref 4.22–5.81)
RDW: 13.8 % (ref 11.5–15.5)
WBC: 13.7 10*3/uL — AB (ref 4.0–10.5)

## 2015-05-17 LAB — GLUCOSE, CAPILLARY
GLUCOSE-CAPILLARY: 95 mg/dL (ref 65–99)
Glucose-Capillary: 91 mg/dL (ref 65–99)
Glucose-Capillary: 104 mg/dL — ABNORMAL HIGH (ref 65–99)

## 2015-05-17 MED ORDER — HYDRALAZINE HCL 50 MG PO TABS
50.0000 mg | ORAL_TABLET | Freq: Four times a day (QID) | ORAL | Status: DC
  Administered 2015-05-17 – 2015-05-20 (×13): 50 mg via ORAL
  Filled 2015-05-17 (×13): qty 1

## 2015-05-17 MED ORDER — CIPROFLOXACIN HCL 500 MG PO TABS: 500.0000 mg | ORAL_TABLET | Freq: Two times a day (BID) | ORAL | Status: DC

## 2015-05-17 MED ORDER — CIPROFLOXACIN HCL 500 MG PO TABS
500.0000 mg | ORAL_TABLET | Freq: Two times a day (BID) | ORAL | Status: DC
  Administered 2015-05-17 – 2015-05-19 (×5): 500 mg via ORAL
  Filled 2015-05-17 (×5): qty 1

## 2015-05-17 MED ORDER — TRAMADOL HCL 50 MG PO TABS
50.0000 mg | ORAL_TABLET | Freq: Four times a day (QID) | ORAL | Status: DC | PRN
Start: 2015-05-17 — End: 2015-08-22

## 2015-05-17 MED ORDER — HYDRALAZINE HCL 25 MG PO TABS: 25.0000 mg | ORAL_TABLET | Freq: Three times a day (TID) | ORAL | Status: DC

## 2015-05-17 MED ORDER — QUETIAPINE FUMARATE 25 MG PO TABS: 12.5000 mg | ORAL_TABLET | Freq: Every day | ORAL | Status: DC

## 2015-05-17 NOTE — [place] FL2 (Signed)
[place] MEDICAID FL2 LEVEL OF CARE SCREENING TOOL     IDENTIFICATION  Patient Name: Marcus Weaver Birthdate: [birth year] Sex: male Admission Date (Current Location): 05/15/2015  [facility] and [place] Number:  Herbalist and Address:  [facility], [place] [address] [place], [place] [address]      Provider Number: [id number]  Attending Physician Name and Address:  Caren Griffins, MD  Relative Name and Phone Number:       Current Level of Care: Hospital Recommended Level of Care: [place] Prior Approval Number:    Date Approved/Denied:   PASRR Number: [id number]  Discharge Plan: SNF    Current Diagnoses: Patient Active Problem List   Diagnosis Date Noted  . UTI (urinary tract infection) 05/16/2015  . Acute UTI 05/15/2015  . Transient disorientation 05/15/2015  . Fall 05/15/2015  . Left orbit fracture ([place]) 05/15/2015  . Laceration of left hand/mid finger 05/15/2015  . CKD (chronic kidney disease), stage II 05/15/2015  . Acute kidney injury ([place])   . Altered mental status   . Orbit fracture, left ([place])   . Numbness in both hands 05/05/2015  . Polyuria 01/02/2015  . Broken teeth 06/01/2013  . Physical exam, annual 11/05/2011  . Hyperlipidemia associated with type 2 diabetes mellitus ([place]) 02/10/2010  . Diabetes mellitus, type II ([place]) 01/24/2007  . TOBACCO ABUSE 01/24/2007  . GLAUCOMA 01/24/2007  . CATARACTS 01/24/2007  . Uncontrolled hypertension 01/24/2007  . CONSTIPATION 01/24/2007    Orientation RESPIRATION BLADDER Height & Weight     Self  Normal Incontinent Weight: 197 lb 4.8 oz (89.495 kg) Height:  5\' 8"  (172.7 cm)  BEHAVIORAL SYMPTOMS/MOOD NEUROLOGICAL BOWEL NUTRITION STATUS   (NONE )  (NONE ) Incontinent Diet (CARB MODIFIED )  AMBULATORY STATUS COMMUNICATION OF NEEDS Skin   Extensive Assist Verbally Normal                       Personal Care Assistance Level of Assistance  Bathing, Feeding,  Dressing Bathing Assistance: Maximum assistance Feeding assistance: Independent Dressing Assistance: Maximum assistance     Functional Limitations Info  Sight, Hearing, Speech Sight Info: Adequate Hearing Info: Adequate Speech Info: Adequate    SPECIAL CARE FACTORS FREQUENCY  PT (By licensed PT), OT (By licensed OT)     PT Frequency: 3 OT Frequency: 2            Contractures      Additional Factors Info  Code Status, Allergies Code Status Info: FULL CODE  Allergies Info: N/A           Current Medications (05/17/2015):  This is the current hospital active medication list Current Facility-Administered Medications  Medication Dose Route Frequency Provider Last Rate Last Dose  . 0.9 %  sodium chloride infusion   Intravenous Continuous Samella Parr, NP   Stopped at 05/17/15 [phone]  . acetaminophen (TYLENOL) tablet 650 mg  650 mg Oral Q4H PRN Samella Parr, NP      . aspirin EC tablet 81 mg  81 mg Oral Daily Samella Parr, NP   81 mg at 05/17/15 1103  . ciprofloxacin (CIPRO) tablet 500 mg  500 mg Oral BID Caren Griffins, MD   500 mg at 05/17/15 1101  . enoxaparin (LOVENOX) injection 40 mg  40 mg Subcutaneous Q24H Rolla Flatten, RPH   40 mg at 05/17/15 [id number]  . haloperidol lactate (HALDOL) injection 0.5 mg  0.5 mg Intravenous  Q6H PRN Caren Griffins, MD   0.5 mg at 05/17/15 [id number]  . hydrALAZINE (APRESOLINE) injection 10 mg  10 mg Intravenous Q4H PRN Samella Parr, NP   10 mg at 05/16/15 0100  . hydrALAZINE (APRESOLINE) tablet 50 mg  50 mg Oral 4 times per day Caren Griffins, MD   50 mg at 05/17/15 1101  . insulin aspart (novoLOG) injection 0-5 Units  0-5 Units Subcutaneous QHS Samella Parr, NP   0 Units at 05/15/15 2200  . insulin aspart (novoLOG) injection 0-9 Units  0-9 Units Subcutaneous TID WC Samella Parr, NP   1 Units at 05/16/15 1756  . insulin glargine (LANTUS) injection 40 Units  40 Units Subcutaneous Daily Caren Griffins, MD   40 Units at  05/17/15 [phone]  . lisinopril (PRINIVIL,ZESTRIL) tablet 20 mg  20 mg Oral Daily Samella Parr, NP   20 mg at 05/17/15 1103  . LORazepam (ATIVAN) injection 0.5 mg  0.5 mg Intravenous Q4H PRN Caren Griffins, MD   0.5 mg at 05/17/15 0736  . metoprolol (LOPRESSOR) tablet 50 mg  50 mg Oral BID Albertine Patricia, MD   50 mg at 05/17/15 1103  . NIFEdipine (PROCARDIA-XL/ADALAT CC) 24 hr tablet 30 mg  30 mg Oral Daily Samella Parr, NP   30 mg at 05/17/15 1101  . QUEtiapine (SEROQUEL) tablet 12.5 mg  12.5 mg Oral BID Caren Griffins, MD   12.5 mg at 05/17/15 1102  . tobramycin (TOBREX) 0.3 % ophthalmic solution 1 drop  1 drop Right Eye QID Caren Griffins, MD   1 drop at 05/17/15 [id number]  . traMADol (ULTRAM) tablet 50 mg  50 mg Oral Q6H PRN Samella Parr, NP   50 mg at 05/15/15 2112     Discharge Medications: Please see discharge summary for a list of discharge medications.  Relevant Imaging Results:  Relevant Lab Results:   Additional Information SSN [id number]  Glendon Axe, MSW, LCSWA [phone] 05/17/2015 3:19 PM

## 2015-05-17 NOTE — Progress Notes (Addendum)
PROGRESS NOTE  Marcus Weaver [id number] DOB: 1930/06/16 DOA: 05/15/2015 PCP: Annye Asa, MD Outpatient Specialists:    LOS: 1 day   Brief Narrative: 82 year old male with history of diabetes mellitus, hypertension, hyperlipidemia, lives at home with daughter, found in neighbor's backyard, confused, disoriented, with no pants on, workup was significant for UTI.  Assessment & Plan: Active Problems:   Diabetes mellitus, type II ([place])   Hyperlipidemia associated with type 2 diabetes mellitus ([place])   Uncontrolled hypertension   Acute UTI   Transient disorientation   Fall   Left orbit fracture (HCC)   Laceration of left hand/mid finger   CKD (chronic kidney disease), stage II   UTI (urinary tract infection)   Urinary tract infection - Likely contributed to his altered mental status and confusion, patient started on ceftriaxone empirically - Renal ultrasound without acute findings - urine culture without significant growth, narrow to empiric Cipro   Acute encephalopathy with in-hospital delirium  - Likely in the setting of #1, I discussed with his daughter today over the phone, patient has been having intermittent episodes of confusion for the past several years, she thinks he is starting to show signs of dementia - Patient very agitated overnight, confused. Drowsy today throughout the day, hold further haldol and seroquel - TSH normal  Fall/Left orbit fracture /Laceration of left hand/mid finger - Patient reports typically ambulates with a cane and left the home unassisted without a cane and apparently disoriented so suspect this contributed to patient's falls - EDP discussed with on-call ENT physician [facility]) he states this is an outpatient follow-up-will need to have outpatient follow-up scheduled prior to discharge. I have asked for an appointment  - X-ray left hand concerning for a nondisplaced fracture at the base of the ring finger proximal phalanx. Admission MD  did discuss with Dr. Fredna Dow from hand surgery, recommended to apply finger splint and follow-up in their office next week. I've asked for an appointment. Continue splint until then. Laceration sutured by the ED physician - Tylenol and Ultram for pain  Diabetes mellitus, type II  - Patient reports recently CBGs have been running higher than usual although he also describes CBGs as being "up and down" - This week instructed by Dr. Loanne Drilling to increase Lantus to 80 units every morning, patient does not appear to be on any mealtime coverage - CBG dropped to 58 in the ED, his Lantus was originally held, his CBG are starting to creep up today, I will resume his Lantus at a lower dose of 40 units per day.  Uncontrolled hypertension - continue current regimen  CKD III - Cr stable  Hyperlipidemia associated with type 2 diabetes mellitus  - Continue statin   DVT prophylaxis: Lovenox  Code Status: Full code  Family Communication: Discussed with his son at bedside as well as his daughter over the phone  Disposition Plan: Home versus skilled, will discuss with family again tomorrow Barriers for discharge: Acute encephalopathy  Consultants:   None   Procedures:   None   Antimicrobials:  Ceftriaxone 2/23 >>   Subjective: - Patient delirious this morning, appears to be picking stuff up in the air, tries to climb out of bed and doesn't make much sense. Appears agitated.   Objective: Filed Vitals:   05/17/15 0826 05/17/15 1056 05/17/15 1453 05/17/15 1743  BP: 189/67 180/82 154/62 167/59  Pulse: 103 106 94 75  Temp:   99.2 F (37.3 C)   TempSrc:   Oral   Resp:  18 16  Height:      Weight:      SpO2:   100%     Intake/Output Summary (Last 24 hours) at 05/17/15 1753 Last data filed at 05/17/15 1406  Gross per 24 hour  Intake 736.17 ml  Output    550 ml  Net 186.17 ml   Filed Weights   05/15/15 1700  Weight: 89.495 kg (197 lb 4.8 oz)    Examination: BP 167/59 mmHg  Pulse  75  Temp(Src) 99.2 F (37.3 C) (Oral)  Resp 16  Ht 5\' 8"  (1.727 m)  Wt 89.495 kg (197 lb 4.8 oz)  BMI 30.01 kg/m2  SpO2 100% General exam: NAD Respiratory system: Patient grabs my stethoscope and will not let me examine him Central nervous system: AxOx1 to person only. No apparent focal deficits, he moves all 4 extremities   Data Reviewed: I have personally reviewed following labs and imaging studies  CBC:  Recent Labs Lab 05/15/15 0644 05/16/15 0717 05/17/15 0706  WBC 9.7 12.5* 13.7*  NEUTROABS 7.6  --   --   HGB 14.5 14.0 12.8*  HCT 42.1 40.7 36.7*  MCV 87.2 85.5 86.4  PLT 200 192 [id number]   Basic Metabolic Panel:  Recent Labs Lab 05/15/15 0644 05/15/15 1628 05/16/15 0717 05/17/15 0706  NA 142  --  139 140  K 4.0  --  4.0 3.7  CL 110  --  108 108  CO2 22  --  18* 22  GLUCOSE 102*  --  182* 104*  BUN 13  --  15 22*  CREATININE 1.30*  --  1.61* 1.67*  CALCIUM 9.2  --  9.2 9.0  MG  --  1.7 1.7  --   PHOS  --  2.1* 2.8  --    Liver Function Tests:  Recent Labs Lab 05/15/15 0644  AST 32  ALT 15*  ALKPHOS 67  BILITOT 0.3  PROT 6.7  ALBUMIN 3.1*   HbA1C:  Recent Labs  05/15/15 1012  HGBA1C 8.7*   CBG:  Recent Labs Lab 05/16/15 1746 05/16/15 2153 05/17/15 0742 05/17/15 1236 05/17/15 1615  GLUCAP 139* 174* 91 104* 95   Thyroid Function Tests:  Recent Labs  05/15/15 1628  TSH 1.882   Urine analysis:    Component Value Date/Time   COLORURINE YELLOW 05/15/2015 0620   APPEARANCEUR CLOUDY* 05/15/2015 0620   LABSPEC 1.013 05/15/2015 0620   PHURINE 5.5 05/15/2015 0620   GLUCOSEU NEGATIVE 05/15/2015 0620   HGBUR LARGE* 05/15/2015 0620   BILIRUBINUR NEGATIVE 05/15/2015 0620   BILIRUBINUR negative 01/02/2015 1407   KETONESUR NEGATIVE 05/15/2015 0620   PROTEINUR >300* 05/15/2015 0620   PROTEINUR 3+ 01/02/2015 1407   UROBILINOGEN 0.2 01/02/2015 1407   UROBILINOGEN 1.0 07/29/2009 0549   NITRITE NEGATIVE 05/15/2015 0620   NITRITE negative  01/02/2015 1407   LEUKOCYTESUR NEGATIVE 05/15/2015 [id number]   Radiology Studies: No results found.   Scheduled Meds: . aspirin EC  81 mg Oral Daily  . ciprofloxacin  500 mg Oral BID  . enoxaparin (LOVENOX) injection  40 mg Subcutaneous Q24H  . hydrALAZINE  50 mg Oral 4 times per day  . insulin aspart  0-5 Units Subcutaneous QHS  . insulin aspart  0-9 Units Subcutaneous TID WC  . insulin glargine  40 Units Subcutaneous Daily  . lisinopril  20 mg Oral Daily  . metoprolol tartrate  50 mg Oral BID  . NIFEdipine  30 mg Oral Daily  . tobramycin  1 drop  Right Eye QID   Continuous Infusions: . sodium chloride Stopped (05/17/15 0735)   Marzetta Board, MD, PhD [facility] Pager [phone]  If 7PM-7AM, please contact night-coverage [email or web address] Password [email or web address] 05/17/2015, 5:53 PM

## 2015-05-17 NOTE — Discharge Instructions (Signed)
Follow with Katherine Tabori, MD in 5-7 days ° °Please get a complete blood count and chemistry panel checked by your Primary MD at your next visit, and again as instructed by your Primary MD. Please get your medications reviewed and adjusted by your Primary MD. ° °Please request your Primary MD to go over all Hospital Tests and Procedure/Radiological results at the follow up, please get all Hospital records sent to your Prim MD by signing hospital release before you go home. ° °If you had Pneumonia of Lung problems at the Hospital: °Please get a 2 view Chest X ray done in 6-8 weeks after hospital discharge or sooner if instructed by your Primary MD. ° °If you have Congestive Heart Failure: °Please call your Cardiologist or Primary MD anytime you have any of the following symptoms:  °1) 3 pound weight gain in 24 hours or 5 pounds in 1 week  °2) shortness of breath, with or without a dry hacking cough  °3) swelling in the hands, feet or stomach  °4) if you have to sleep on extra pillows at night in order to breathe ° °Follow cardiac low salt diet and 1.5 lit/day fluid restriction. ° °If you have diabetes °Accuchecks 4 times/day, Once in AM empty stomach and then before each meal. °Log in all results and show them to your primary doctor at your next visit. °If any glucose reading is under 80 or above 300 call your primary MD immediately. ° °If you have Seizure/Convulsions/Epilepsy: °Please do not drive, operate heavy machinery, participate in activities at heights or participate in high speed sports until you have seen by Primary MD or a Neurologist and advised to do so again. ° °If you had Gastrointestinal Bleeding: °Please ask your Primary MD to check a complete blood count within one week of discharge or at your next visit. Your endoscopic/colonoscopic biopsies that are pending at the time of discharge, will also need to followed by your Primary MD. ° °Get Medicines reviewed and adjusted. °Please take all your  medications with you for your next visit with your Primary MD ° °Please request your Primary MD to go over all hospital tests and procedure/radiological results at the follow up, please ask your Primary MD to get all Hospital records sent to his/her office. ° °If you experience worsening of your admission symptoms, develop shortness of breath, life threatening emergency, suicidal or homicidal thoughts you must seek medical attention immediately by calling 911 or calling your MD immediately  if symptoms less severe. ° °You must read complete instructions/literature along with all the possible adverse reactions/side effects for all the Medicines you take and that have been prescribed to you. Take any new Medicines after you have completely understood and accpet all the possible adverse reactions/side effects.  ° °Do not drive or operate heavy machinery when taking Pain medications.  ° °Do not take more than prescribed Pain, Sleep and Anxiety Medications ° °Special Instructions: If you have smoked or chewed Tobacco  in the last 2 yrs please stop smoking, stop any regular Alcohol  and or any Recreational drug use. ° °Wear Seat belts while driving. ° °Please note °You were cared for by a hospitalist during your hospital stay. If you have any questions about your discharge medications or the care you received while you were in the hospital after you are discharged, you can call the unit and asked to speak with the hospitalist on call if the hospitalist that took care of you is not available. Once   you are discharged, your primary care physician will handle any further medical issues. Please note that NO REFILLS for any discharge medications will be authorized once you are discharged, as it is imperative that you return to your primary care physician (or establish a relationship with a primary care physician if you do not have one) for your aftercare needs so that they can reassess your need for medications and monitor your  lab values. ° °You can reach the hospitalist office at phone [phone] or fax [phone] °  °If you do not have a primary care physician, you can call [phone] for a physician referral. ° °Activity: As tolerated with Full fall precautions use walker/cane & assistance as needed ° °Diet: regular ° °Disposition Home ° ° °

## 2015-05-17 NOTE — Clinical Social Work Placement (Signed)
   CLINICAL SOCIAL WORK PLACEMENT  NOTE  Date:  05/17/2015  Patient Details  Name: Marcus Weaver MRN: [id number] Date of Birth: 05/24/1930  Clinical Social Work is seeking post-discharge placement for this patient at the [place] level of care (*CSW will initial, date and re-position this form in  chart as items are completed):  Yes   Patient/family provided with [facility] Work Department's list of facilities offering this level of care within the geographic area requested by the patient (or if unable, by the patient's family).  Yes   Patient/family informed of their freedom to choose among providers that offer the needed level of care, that participate in Medicare, Medicaid or managed care program needed by the patient, have an available bed and are willing to accept the patient.  Yes   Patient/family informed of [facility]'s ownership interest in [facility] and [facility], as well as of the fact that they are under no obligation to receive care at these facilities.  PASRR submitted to EDS on 05/17/15     PASRR number received on 05/17/15     Existing PASRR number confirmed on       FL2 transmitted to all facilities in geographic area requested by pt/family on 05/17/15     FL2 transmitted to all facilities within larger geographic area on       Patient informed that his/her managed care company has contracts with or will negotiate with certain facilities, including the following:            Patient/family informed of bed offers received.  Patient chooses bed at       Physician recommends and patient chooses bed at      Patient to be transferred to   on  .  Patient to be transferred to facility by       Patient family notified on   of transfer.  Name of family member notified:        PHYSICIAN Please sign FL2     Additional Comment:    _______________________________________________ Rozell Searing, LCSW 05/17/2015, 3:22  PM

## 2015-05-17 NOTE — Progress Notes (Signed)
Patient remains asleep, arousable to voice. Per report from night RN patient was awake throughout the night. Dr. Cruzita Lederer made aware of patient's lethargy, and verbal order not to discharge patient until he is more awake and to update MD later today if patient is still asleep.

## 2015-05-17 NOTE — Clinical Social Work Note (Signed)
Clinical Social Work Assessment  Patient Details  Name: Marcus Weaver MRN: [id number] Date of Birth: [birth year]  Date of referral:  05/17/15               Reason for consult:  Facility Placement, Discharge Planning                Permission sought to share information with:  Family Supports, Case Freight forwarder, Customer service manager Permission granted to share information::  Yes, Verbal Permission Granted  Name::      Management consultant )  Agency::   (SNF's )  Relationship::   (Daughter )  Contact Information:   [phone])  Housing/Transportation Living arrangements for the past 2 months:  [place] of Information:  Adult Children Patient Interpreter Needed:  None Criminal Activity/Legal Involvement Pertinent to Current Situation/Hospitalization:  No - Comment as needed Significant Relationships:  Adult Children Lives with:  Adult Children Do you feel safe going back to the place where you live?  Yes Need for family participation in patient care:  Yes (Comment)  Care giving concerns:  Patient from home with daughter, Bee. Patient now requiring short-term rehab placement to return to physical baseline functioning.    Social Worker assessment / plan:  Holiday representative spoke at [employer] in pt's dtr, Bee in reference to post-acute placement for SNF. CSW introduced CSW role and SNF process. Patient dtr reported that patient lives with her and family is agreeable to ST SNF placement only. Pt's dtr not at [facility] during time of assessment. CSW has reviewed and provided SNF list at bedside for pt's dtr to review. Pt's dtr stated she will review list and contact CSW with questions as needed. No further concerns reported at this time. CSW will continue to follow pt and pt's family for continued support and to facilitate pt's discharge needs once medically stable.   Employment status:  Retired Nurse, adult PT Recommendations:  [place] / Referral to community resources:  [place]  Patient/Family's Response to care:  Patient asleep and disoriented. Pt's dtr agreeable to SNF placement and plans to review SNF list. Pt's dtr pleasant and appreciated social work intervention.   Patient/Family's Understanding of and Emotional Response to Diagnosis, Current Treatment, and Prognosis:  Pt's dtr involved in pt's care and aware of continued medical interventions/observations.   Emotional Assessment Appearance:  Appears stated age Attitude/Demeanor/Rapport:  Unable to Assess, Lethargic Affect (typically observed):  Unable to Assess Orientation:  Oriented to Self Alcohol / Substance use:  Not Applicable Psych involvement (Current and /or in the community):  No (Comment)  Discharge Needs  Concerns to be addressed:  Care Coordination Readmission within the last 30 days:  No Current discharge risk:  Dependent with Mobility, Cognitively Impaired Barriers to Discharge:  Continued Medical Work up   [employer], MSW, LCSWA [phone] 05/17/2015 3:29 PM

## 2015-05-17 NOTE — Care Management Note (Signed)
Case Management Note  Patient Details  Name: Koltan Portocarrero MRN: [id number] Date of Birth: November 03, 1930  Subjective/Objective:                  confused, disoriented Action/Plan: Discharge planning Expected Discharge Date:  05/17/15               Expected Discharge Plan:  [place]  In-House Referral:     Discharge planning Services  CM Consult  Post Acute Care Choice:    Choice offered to:  Adult Children  DME Arranged:  3-N-1 DME Agency:  [facility] Arranged:  RN, PT, Social Work [employer] Agency:  [facility]  Status of Service:  Completed, signed off  Medicare Important Message Given:    Date Medicare IM Given:    Medicare IM give by:    Date Additional Medicare IM Given:    Additional Medicare Important Message give by:     If discussed at [place] of Stay Meetings, dates discussed:    Additional Comments: CM met with pt and son in room to offer choice of home health agency.  Pt lives with daughter, Lawerance Sabal [phone] and will be returning to home.  [facility] will provide HHPT/RN/SW.  Referral called to [facility] rep, Tiffany.  CM clled [facility] DME rep, Merry Proud to please deliver the 3n1 to room prior to discharge.  No other CM needs were communicated. Dellie Catholic, RN 05/17/2015, 9:45 AM

## 2015-05-18 ENCOUNTER — Inpatient Hospital Stay (HOSPITAL_COMMUNITY): Payer: Commercial Managed Care - HMO

## 2015-05-18 DIAGNOSIS — N184 Chronic kidney disease, stage 4 (severe): Secondary | ICD-10-CM

## 2015-05-18 DIAGNOSIS — N183 Chronic kidney disease, stage 3 (moderate): Secondary | ICD-10-CM

## 2015-05-18 LAB — CBC
HEMATOCRIT: 35.7 % — AB (ref 39.0–52.0)
HEMOGLOBIN: 12.1 g/dL — AB (ref 13.0–17.0)
MCH: 29.1 pg (ref 26.0–34.0)
MCHC: 33.9 g/dL (ref 30.0–36.0)
MCV: 85.8 fL (ref 78.0–100.0)
PLATELETS: 188 10*3/uL (ref 150–400)
RBC: 4.16 MIL/uL — AB (ref 4.22–5.81)
RDW: 14.1 % (ref 11.5–15.5)
WBC: 13 10*3/uL — AB (ref 4.0–10.5)

## 2015-05-18 LAB — BASIC METABOLIC PANEL
ANION GAP: 13 (ref 5–15)
BUN: 28 mg/dL — ABNORMAL HIGH (ref 6–20)
CALCIUM: 9 mg/dL (ref 8.9–10.3)
CO2: 20 mmol/L — ABNORMAL LOW (ref 22–32)
Chloride: 106 mmol/L (ref 101–111)
Creatinine, Ser: 2.01 mg/dL — ABNORMAL HIGH (ref 0.61–1.24)
GFR calc Af Amer: 34 mL/min — ABNORMAL LOW (ref >60)
GFR, EST NON AFRICAN AMERICAN: 29 mL/min — AB (ref >60)
GLUCOSE: 116 mg/dL — AB (ref 65–99)
Potassium: 4 mmol/L (ref 3.5–5.1)
Sodium: 139 mmol/L (ref 135–145)

## 2015-05-18 LAB — GLUCOSE, CAPILLARY
GLUCOSE-CAPILLARY: 153 mg/dL — AB (ref 65–99)
GLUCOSE-CAPILLARY: 121 mg/dL — AB (ref 65–99)
Glucose-Capillary: 50 mg/dL — ABNORMAL LOW (ref 65–99)

## 2015-05-18 NOTE — Progress Notes (Addendum)
PROGRESS NOTE  Marcus Weaver [id number] DOB: 10-Nov-1930 DOA: 05/15/2015 PCP: Annye Asa, MD Outpatient Specialists:    LOS: 2 days   Brief Narrative: 82 year old male with history of diabetes mellitus, hypertension, hyperlipidemia, lives at home with daughter, found in neighbor's backyard, confused, disoriented, with no pants on, workup was significant for UTI.  Assessment & Plan: Active Problems:   Diabetes mellitus, type II ([place])   Hyperlipidemia associated with type 2 diabetes mellitus ([place])   Uncontrolled hypertension   Acute UTI   Transient disorientation   Fall   Left orbit fracture ([facility])   Laceration of left hand/mid finger   UTI (urinary tract infection)   CKD (chronic kidney disease), stage III   Urinary tract infection - Likely contributed to his altered mental status and confusion, patient started on ceftriaxone empirically - Renal ultrasound without acute findings - urine culture without significant growth, narrow to empiric Cipro   Fever - 100.6 this morning, asymptomatic - will need further imaging of his left hand and wrist, incomplete MRI today due to non cooperation  Acute encephalopathy with in-hospital delirium  - Likely in the setting of #1, I discussed with his daughter over the phone 2/24, patient has been having intermittent episodes of confusion for the past several years, she thinks he is starting to show signs of dementia - TSH normal  Fall/Left orbit fracture /Laceration of left hand/mid finger - Patient reports typically ambulates with a cane and left the home unassisted without a cane and apparently disoriented so suspect this contributed to patient's falls - EDP discussed with on-call ENT physician [facility]) he states this is an outpatient follow-up-will need to have outpatient follow-up scheduled prior to discharge. I have asked for an appointment  - X-ray left hand concerning for a nondisplaced fracture at the base of the ring  finger proximal phalanx. Admission MD did discuss with Dr. Fredna Dow from hand surgery, recommended to apply finger splint and follow-up in their office next week. Continue splint until then. Laceration sutured by the ED physician - Tylenol and Ultram for pain - since pt will be here in am will discuss with Dr. Fredna Dow  Diabetes mellitus, type II  - Patient reports recently CBGs have been running higher than usual although he also describes CBGs as being "up and down" - This week instructed by Dr. Loanne Drilling to increase Lantus to 80 units every morning, patient does not appear to be on any mealtime coverage - CBG dropped to 58 in the ED, his Lantus was originally held, his CBG are starting to creep up today, I will resume his Lantus at a lower dose of 40 units per day. - stable on this regimen  Uncontrolled hypertension - continue current regimen  CKD III - Cr increasing this morning, likely pre-renal due to poor po intake in the past 2 days, increase fluids overnight and repeat renal function in the morning  Hyperlipidemia associated with type 2 diabetes mellitus  - Continue statin   DVT prophylaxis: Lovenox  Code Status: Full code  Family Communication: Discussed with his son at bedside Disposition Plan: SNF when ready Barriers for discharge: fever   Consultants:   None   Procedures:   None   Antimicrobials:  Ceftriaxone 2/23 >>   Subjective: - calm this morning, no complaints. Mild left hand pain and swelling  Objective: Filed Vitals:   05/17/15 2102 05/18/15 0316 05/18/15 0607 05/18/15 1450  BP: 168/65 148/51 137/45 151/52  Pulse: 110 99 102 81  Temp: 97.9  F (36.6 C)  100.6 F (38.1 C) 98.9 F (37.2 C)  TempSrc: Oral  Oral Oral  Resp: 18  18 18   Height:      Weight:      SpO2: 93%  92% 97%    Intake/Output Summary (Last 24 hours) at 05/18/15 1504 Last data filed at 05/18/15 1445  Gross per 24 hour  Intake    530 ml  Output    650 ml  Net   -120 ml   Filed  Weights   05/15/15 1700  Weight: 89.495 kg (197 lb 4.8 oz)    Examination: BP 151/52 mmHg  Pulse 81  Temp(Src) 98.9 F (37.2 C) (Oral)  Resp 18  Ht 5\' 8"  (1.727 m)  Wt 89.495 kg (197 lb 4.8 oz)  BMI 30.01 kg/m2  SpO2 97% General exam: NAD Respiratory system: no wheezing, CTA biL Cardiovascular: RRR without MRG, no JVD, 2+ pulses Ext: left wrist wrapped, hand with mild swelling, some tenderness with mobilization Central nervous system: AxOx1 to person only. No apparent focal deficits, he moves all 4 extremities  Skin: no rashes Psych: calm, normal affect  Data Reviewed: I have personally reviewed following labs and imaging studies  CBC:  Recent Labs Lab 05/15/15 0644 05/16/15 0717 05/17/15 0706 05/18/15 0954  WBC 9.7 12.5* 13.7* 13.0*  NEUTROABS 7.6  --   --   --   HGB 14.5 14.0 12.8* 12.1*  HCT 42.1 40.7 36.7* 35.7*  MCV 87.2 85.5 86.4 85.8  PLT 200 192 183 0000000   Basic Metabolic Panel:  Recent Labs Lab 05/15/15 0644 05/15/15 1628 05/16/15 0717 05/17/15 0706 05/18/15 0954  NA 142  --  139 140 139  K 4.0  --  4.0 3.7 4.0  CL 110  --  108 108 106  CO2 22  --  18* 22 20*  GLUCOSE 102*  --  182* 104* 116*  BUN 13  --  15 22* 28*  CREATININE 1.30*  --  1.61* 1.67* 2.01*  CALCIUM 9.2  --  9.2 9.0 9.0  MG  --  1.7 1.7  --   --   PHOS  --  2.1* 2.8  --   --    Liver Function Tests:  Recent Labs Lab 05/15/15 0644  AST 32  ALT 15*  ALKPHOS 67  BILITOT 0.3  PROT 6.7  ALBUMIN 3.1*   HbA1C: No results for input(s): HGBA1C in the last 72 hours. CBG:  Recent Labs Lab 05/17/15 0742 05/17/15 1236 05/17/15 1615 05/18/15 0830 05/18/15 1119  GLUCAP 91 104* 95 50* 153*   Thyroid Function Tests:  Recent Labs  05/15/15 1628  TSH 1.882   Urine analysis:    Component Value Date/Time   COLORURINE YELLOW 05/15/2015 0620   APPEARANCEUR CLOUDY* 05/15/2015 0620   LABSPEC 1.013 05/15/2015 0620   PHURINE 5.5 05/15/2015 0620   GLUCOSEU NEGATIVE  05/15/2015 0620   HGBUR LARGE* 05/15/2015 0620   BILIRUBINUR NEGATIVE 05/15/2015 0620   BILIRUBINUR negative 01/02/2015 1407   KETONESUR NEGATIVE 05/15/2015 0620   PROTEINUR >300* 05/15/2015 0620   PROTEINUR 3+ 01/02/2015 1407   UROBILINOGEN 0.2 01/02/2015 1407   UROBILINOGEN 1.0 07/29/2009 0549   NITRITE NEGATIVE 05/15/2015 0620   NITRITE negative 01/02/2015 1407   LEUKOCYTESUR NEGATIVE 05/15/2015 [id number]   Radiology Studies: Mr Wrist Left Wo Contrast  05/18/2015  CLINICAL DATA:  Left hand swelling and laceration. Diabetes. Dementia. Found and neighbors back yard, confused, disrobed, disoriented, with possible fractures in the  hand on radiography. EXAM: MR OF THE LEFT WRIST WITHOUT CONTRAST TECHNIQUE: Multiplanar, multisequence MR imaging of the left wrist was performed. No intravenous contrast was administered. COMPARISON:  None. FINDINGS: Despite efforts by the technologist and patient, motion artifact is present on today's exam and could not be eliminated. This reduces exam sensitivity and specificity. The patient is not able to cooperate well with imaging ; we obtained best exam possible under the circumstances. Please note that the patient was thought to potentially have fractures long the bases of the fourth proximal phalanx; today's exam is of the wrist and not the hand and accordingly this is not included in this particular exam. Ligaments: There is some ill definition of the volar portion of the scapholunate ligament but no scapholunate separation or other indicator that the ligament is torn. Volar and dorsal ligaments of the wrist are very poorly seen due to motion. Triangular fibrocartilage: Grossly intact. No effusion of the distal radial ulnar joint seen. Tendons: Grossly unremarkable. Carpal tunnel/median nerve: High unremarkable Guyon's canal: No impingement identified. Joint/cartilage: No obvious effusion. Cartilage difficult to assess due to motion. Bones/carpal alignment: Scattered  geodes or degenerative subcortical cysts in the carpus the. Possible erosion is at the articulation of the scaphoid and trapezium. Degenerative spurring at the first carpometacarpal articulation. I do not perceive a carpal fracture or fracture of the metacarpal bases. Other: Subcutaneous edema along the dorsum and radial side of the proximal hand, nonspecific. IMPRESSION: 1. Considerable motion artifact and signal loss due to patient's difficulty cooperating with the exam. 2. Subcutaneous edema in the visualized proximal hand. Please note that the phalanges were not included on today's exam which only includes to the mid metacarpal level. 3. Degenerative findings in the wrist with scattered subcortical cysts or geodes. Cannot exclude erosive arthropathy at the articulation of the scaphoid and trapezium. Degenerative spurring at the first carpometacarpal articulation. Electronically Signed   By: Van Clines M.D.   On: 05/18/2015 13:41     Scheduled Meds: . aspirin EC  81 mg Oral Daily  . ciprofloxacin  500 mg Oral BID  . enoxaparin (LOVENOX) injection  40 mg Subcutaneous Q24H  . hydrALAZINE  50 mg Oral 4 times per day  . insulin aspart  0-5 Units Subcutaneous QHS  . insulin aspart  0-9 Units Subcutaneous TID WC  . insulin glargine  40 Units Subcutaneous Daily  . lisinopril  20 mg Oral Daily  . metoprolol tartrate  50 mg Oral BID  . NIFEdipine  30 mg Oral Daily  . tobramycin  1 drop Right Eye QID   Continuous Infusions: . sodium chloride Stopped (05/17/15 0735)   Marzetta Board, MD, PhD [facility] Pager [phone]  If 7PM-7AM, please contact night-coverage [email or web address] Password [facility] 05/18/2015, 3:04 PM

## 2015-05-18 NOTE — Progress Notes (Signed)
Pt's MRI left wrist has been completed. Pt refused further imaging when I went in move equipment over his hand from wrist. I gave him a break and tried to coax him through the first few pictures of his hand. He became further agitated and insisted I let him rest and get him out. Pt will have to return for mri hand.

## 2015-05-19 ENCOUNTER — Inpatient Hospital Stay (HOSPITAL_COMMUNITY): Payer: Commercial Managed Care - HMO

## 2015-05-19 ENCOUNTER — Other Ambulatory Visit: Payer: Self-pay | Admitting: *Deleted

## 2015-05-19 DIAGNOSIS — R200 Anesthesia of skin: Secondary | ICD-10-CM

## 2015-05-19 LAB — GLUCOSE, CAPILLARY
GLUCOSE-CAPILLARY: 246 mg/dL — AB (ref 65–99)
GLUCOSE-CAPILLARY: 162 mg/dL — AB (ref 65–99)
Glucose-Capillary: 199 mg/dL — ABNORMAL HIGH (ref 65–99)

## 2015-05-19 LAB — BASIC METABOLIC PANEL
Anion gap: 12 (ref 5–15)
BUN: 32 mg/dL — AB (ref 6–20)
CO2: 21 mmol/L — ABNORMAL LOW (ref 22–32)
CREATININE: 1.91 mg/dL — AB (ref 0.61–1.24)
Calcium: 8.8 mg/dL — ABNORMAL LOW (ref 8.9–10.3)
Chloride: 108 mmol/L (ref 101–111)
GFR, EST AFRICAN AMERICAN: 36 mL/min — AB (ref >60)
GFR, EST NON AFRICAN AMERICAN: 31 mL/min — AB (ref >60)
Glucose, Bld: 169 mg/dL — ABNORMAL HIGH (ref 65–99)
POTASSIUM: 4.1 mmol/L (ref 3.5–5.1)
SODIUM: 141 mmol/L (ref 135–145)

## 2015-05-19 LAB — CBC
HEMATOCRIT: 35.8 % — AB (ref 39.0–52.0)
HEMOGLOBIN: 12.3 g/dL — AB (ref 13.0–17.0)
MCH: 30.1 pg (ref 26.0–34.0)
MCHC: 34.4 g/dL (ref 30.0–36.0)
MCV: 87.7 fL (ref 78.0–100.0)
Platelets: 215 10*3/uL (ref 150–400)
RBC: 4.08 MIL/uL — ABNORMAL LOW (ref 4.22–5.81)
RDW: 14.4 % (ref 11.5–15.5)
WBC: 13.2 10*3/uL — AB (ref 4.0–10.5)

## 2015-05-19 MED ORDER — CIPROFLOXACIN HCL 500 MG PO TABS
500.0000 mg | ORAL_TABLET | Freq: Every day | ORAL | Status: DC
  Administered 2015-05-20: 500 mg via ORAL
  Filled 2015-05-19: qty 1

## 2015-05-19 NOTE — Progress Notes (Signed)
Physical Therapy Treatment Patient Details Name: Marcus Weaver MRN: [id number] DOB: 03/05/1931 Today's Date: 05/19/2015    History of Present Illness 82 year old male with history of diabetes mellitus, hypertension, hyperlipidemia, lives at home with daughter, found in neighbor's backyard, confused, disoriented, with no pants on, workup was significant for UTI, CT head with no acute finding, so patient will be admitted for observation for acute encephalopathy secondary to UTI, started on Rocephin, follow on urine cultures.    PT Comments    Pt making slow progress. Continues to need ST-SNF prior to return home.  Follow Up Recommendations  SNF     Equipment Recommendations  None recommended by PT    Recommendations for Other Services       Precautions / Restrictions Precautions Precautions: Fall Restrictions Weight Bearing Restrictions: No    Mobility  Bed Mobility Overal bed mobility: Needs Assistance Bed Mobility: Supine to Sit     Supine to sit: Mod assist;HOB elevated     General bed mobility comments: Assist to bring legs over and to elevate trunk.  Transfers Overall transfer level: Needs assistance Equipment used: Rolling walker (2 wheeled) Transfers: Sit to/from Stand Sit to Stand: +2 physical assistance;Mod assist Stand pivot transfers: +2 physical assistance;Mod assist       General transfer comment: Assist to bring hips up and to make pivotal steps to chair. Pt with posterior lean.  Ambulation/Gait             General Gait Details: Only able to make pivotal steps to chair.    Stairs            Wheelchair Mobility    Modified Rankin (Stroke Patients Only)       Balance Overall balance assessment: Needs assistance Sitting-balance support: Bilateral upper extremity supported;Feet supported Sitting balance-Leahy Scale: Poor Sitting balance - Comments: Needs UE support   Standing balance support: Bilateral upper extremity  supported Standing balance-Leahy Scale: Poor Standing balance comment: walker and mod A for static standing. Posterior lean                    Cognition Arousal/Alertness: Awake/alert Behavior During Therapy: Flat affect Overall Cognitive Status: Impaired/Different from baseline Area of Impairment: Following commands;Safety/judgement;Orientation;Attention;Memory;Awareness;Problem solving Orientation Level: Disoriented to;Place;Time;Situation Current Attention Level: Focused Memory: Decreased short-term memory Following Commands: Follows one step commands inconsistently;Follows one step commands with increased time Safety/Judgement: Decreased awareness of safety;Decreased awareness of deficits Awareness: Intellectual Problem Solving: Difficulty sequencing;Requires tactile cues;Requires verbal cues;Decreased initiation;Slow processing      Exercises      General Comments        Pertinent Vitals/Pain Pain Assessment: Faces Faces Pain Scale: Hurts a little bit Pain Location: lt hand Pain Descriptors / Indicators: Grimacing Pain Intervention(s): Limited activity within patient's tolerance;Monitored during session    Home Living                      Prior Function            PT Goals (current goals can now be found in the care plan section) Acute Rehab PT Goals Patient Stated Goal: To get him clearer Progress towards PT goals: Progressing toward goals    Frequency  Min 2X/week    PT Plan Current plan remains appropriate;Frequency needs to be updated    Co-evaluation             End of Session Equipment Utilized During Treatment: Gait belt Activity Tolerance: Patient tolerated treatment well Patient  left: in chair;with call bell/phone within reach;with chair alarm set     Time: [phone] PT Time Calculation (min) (ACUTE ONLY): 14 min  Charges:  $Therapeutic Activity: 8-22 mins                    G Codes:      Marcus Weaver 2015/06/02,  9:15 AM [facility] PT [phone]

## 2015-05-19 NOTE — Progress Notes (Signed)
PROGRESS NOTE  Marcus Weaver [id number] DOB: 1930/09/02 DOA: 05/15/2015 PCP: Annye Asa, MD Outpatient Specialists:    LOS: 3 days   Brief Narrative: 82 year old male with history of diabetes mellitus, hypertension, hyperlipidemia, lives at home with daughter, found in neighbor's backyard, confused, disoriented, with no pants on, workup was significant for UTI.  Assessment & Plan: Active Problems:   Diabetes mellitus, type II ([place])   Hyperlipidemia associated with type 2 diabetes mellitus ([place])   Uncontrolled hypertension   Acute UTI   Transient disorientation   Fall   Left orbit fracture (HCC)   Laceration of left hand/mid finger   UTI (urinary tract infection)   CKD (chronic kidney disease), stage III   Urinary tract infection - Likely contributed to his altered mental status and confusion, patient started on ceftriaxone empirically - Renal ultrasound without acute findings - urine culture without significant growth, narrow to empiric Cipro   Fever - 100.6 one time, afebrile since, asymptomatic  Acute encephalopathy with in-hospital delirium  - Likely in the setting of #1, I discussed with his daughter over the phone 2/24, patient has been having intermittent episodes of confusion for the past several years, she thinks he is starting to show signs of dementia - TSH normal  Fall/Left orbit fracture /Laceration of left hand/mid finger - Patient reports typically ambulates with a cane and left the home unassisted without a cane and apparently disoriented so suspect this contributed to patient's falls - EDP discussed with on-call ENT physician [facility]) he states this is an outpatient follow-up-will need to have outpatient follow-up scheduled prior to discharge. I have asked for an appointment  - X-ray left hand concerning for a nondisplaced fracture at the base of the ring finger proximal phalanx.  - given fever obtained MRI, Dr. Fredna Dow to see pt while in house.  Appreciate input. - Tylenol and Ultram for pain  Diabetes mellitus, type II  - Patient reports recently CBGs have been running higher than usual although he also describes CBGs as being "up and down" - This week instructed by Dr. Loanne Drilling to increase Lantus to 80 units every morning, patient does not appear to be on any mealtime coverage - CBG dropped to 58 in the ED, his Lantus was originally held, his CBG are starting to creep up today, I will resume his Lantus at a lower dose of 40 units per day. - stable on this regimen  Uncontrolled hypertension - continue current regimen  CKD III - Cr increasing this morning, likely pre-renal due to poor po intake in the past 2 days, increase fluids overnight and repeat renal function in the morning  Hyperlipidemia associated with type 2 diabetes mellitus  - Continue statin   DVT prophylaxis: Lovenox  Code Status: Full code  Family Communication: no family bedside Disposition Plan: SNF when ready, 1 day Barriers for discharge: fever   Consultants:   None   Procedures:   None   Antimicrobials:  Ceftriaxone 2/23 >>   Subjective: - calm this morning, no complaints. Mild left hand pain and swelling  Objective: Filed Vitals:   05/19/15 0153 05/19/15 0239 05/19/15 0508 05/19/15 1455  BP: 159/60  158/50 147/54  Pulse: 93  104 82  Temp:  99.9 F (37.7 C) 98.5 F (36.9 C) 100.1 F (37.8 C)  TempSrc:   Oral Oral  Resp:   18 18  Height:      Weight:      SpO2:   95% 97%    Intake/Output  Summary (Last 24 hours) at 05/19/15 1634 Last data filed at 05/19/15 0853  Gross per 24 hour  Intake 847.08 ml  Output    650 ml  Net 197.08 ml   Filed Weights   05/15/15 1700  Weight: 89.495 kg (197 lb 4.8 oz)    Examination: BP 147/54 mmHg  Pulse 82  Temp(Src) 100.1 F (37.8 C) (Oral)  Resp 18  Ht 5\' 8"  (1.727 m)  Wt 89.495 kg (197 lb 4.8 oz)  BMI 30.01 kg/m2  SpO2 97% General exam: NAD Respiratory system: no wheezing, CTA  biL Cardiovascular: RRR without MRG, no JVD, 2+ pulses Ext: left wrist wrapped, hand with mild swelling, some tenderness with mobilization Central nervous system: AxOx1 to person only. No apparent focal deficits, he moves all 4 extremities  Skin: no rashes Psych: calm, normal affect  Data Reviewed: I have personally reviewed following labs and imaging studies  CBC:  Recent Labs Lab 05/15/15 0644 05/16/15 0717 05/17/15 0706 05/18/15 0954 05/19/15 0738  WBC 9.7 12.5* 13.7* 13.0* 13.2*  NEUTROABS 7.6  --   --   --   --   HGB 14.5 14.0 12.8* 12.1* 12.3*  HCT 42.1 40.7 36.7* 35.7* 35.8*  MCV 87.2 85.5 86.4 85.8 87.7  PLT 200 192 183 188 [id number]   Basic Metabolic Panel:  Recent Labs Lab 05/15/15 0644 05/15/15 1628 05/16/15 0717 05/17/15 0706 05/18/15 0954 05/19/15 0738  NA 142  --  139 140 139 141  K 4.0  --  4.0 3.7 4.0 4.1  CL 110  --  108 108 106 108  CO2 22  --  18* 22 20* 21*  GLUCOSE 102*  --  182* 104* 116* 169*  BUN 13  --  15 22* 28* 32*  CREATININE 1.30*  --  1.61* 1.67* 2.01* 1.91*  CALCIUM 9.2  --  9.2 9.0 9.0 8.8*  MG  --  1.7 1.7  --   --   --   PHOS  --  2.1* 2.8  --   --   --    Liver Function Tests:  Recent Labs Lab 05/15/15 0644  AST 32  ALT 15*  ALKPHOS 67  BILITOT 0.3  PROT 6.7  ALBUMIN 3.1*   HbA1C: No results for input(s): HGBA1C in the last 72 hours. CBG:  Recent Labs Lab 05/17/15 1615 05/18/15 0830 05/18/15 1119 05/18/15 1754 05/19/15 1155  GLUCAP 95 50* 153* 121* 246*   Thyroid Function Tests: No results for input(s): TSH, T4TOTAL, FREET4, T3FREE, THYROIDAB in the last 72 hours. Urine analysis:    Component Value Date/Time   COLORURINE YELLOW 05/15/2015 0620   APPEARANCEUR CLOUDY* 05/15/2015 0620   LABSPEC 1.013 05/15/2015 0620   PHURINE 5.5 05/15/2015 0620   GLUCOSEU NEGATIVE 05/15/2015 0620   HGBUR LARGE* 05/15/2015 0620   BILIRUBINUR NEGATIVE 05/15/2015 0620   BILIRUBINUR negative 01/02/2015 1407   KETONESUR  NEGATIVE 05/15/2015 0620   PROTEINUR >300* 05/15/2015 0620   PROTEINUR 3+ 01/02/2015 1407   UROBILINOGEN 0.2 01/02/2015 1407   UROBILINOGEN 1.0 07/29/2009 0549   NITRITE NEGATIVE 05/15/2015 0620   NITRITE negative 01/02/2015 1407   LEUKOCYTESUR NEGATIVE 05/15/2015 0620   Radiology Studies: Mr Hand Left Wo Contrast  05/19/2015  CLINICAL DATA:  Diabetic patient fell 05/15/2015 with a fracture of the proximal phalanx of the left ring finger. Pain and swelling. Laceration. Initial encounter. EXAM: MRI OF THE LEFT HAND WITHOUT CONTRAST TECHNIQUE: Multiplanar, multisequence MR imaging was performed. No intravenous contrast was administered.  COMPARISON:  Plain films left hand 05/15/2015. MRI of the left wrist 05/18/2015 FINDINGS: Intense subcutaneous edema is present about the hand, worst over the dorsum. No focal fluid collection is identified. There is a fracture through the base of the proximal phalanx of the ring finger on the radial side. Virtually no marrow edema is present about the fracture most consistent with subacute to remote injury. Fracture fragment measures 0.4 cm at the articular surface by 0.6 cm craniocaudal and is minimally distracted. No other fracture is identified. There is partial visualization of first CMC and scaphoid trapezium trapezoid joint osteoarthritis. No erosion is identified. Intrinsic musculature of the hand and imaged tendons are intact. IMPRESSION: Subacute to remote appearing fracture through the base of the proximal phalanx of the ring finger on the radial side as described above. Intense subcutaneous edema about the hand. No focal fluid collection or evidence of osteomyelitis is present. First CMC and STT osteoarthritis. Electronically Signed   By: Inge Rise M.D.   On: 05/19/2015 08:06   Mr Wrist Left Wo Contrast  05/18/2015  CLINICAL DATA:  Left hand swelling and laceration. Diabetes. Dementia. Found and neighbors back yard, confused, disrobed, disoriented, with  possible fractures in the hand on radiography. EXAM: MR OF THE LEFT WRIST WITHOUT CONTRAST TECHNIQUE: Multiplanar, multisequence MR imaging of the left wrist was performed. No intravenous contrast was administered. COMPARISON:  None. FINDINGS: Despite efforts by the technologist and patient, motion artifact is present on today's exam and could not be eliminated. This reduces exam sensitivity and specificity. The patient is not able to cooperate well with imaging ; we obtained best exam possible under the circumstances. Please note that the patient was thought to potentially have fractures long the bases of the fourth proximal phalanx; today's exam is of the wrist and not the hand and accordingly this is not included in this particular exam. Ligaments: There is some ill definition of the volar portion of the scapholunate ligament but no scapholunate separation or other indicator that the ligament is torn. Volar and dorsal ligaments of the wrist are very poorly seen due to motion. Triangular fibrocartilage: Grossly intact. No effusion of the distal radial ulnar joint seen. Tendons: Grossly unremarkable. Carpal tunnel/median nerve: High unremarkable Guyon's canal: No impingement identified. Joint/cartilage: No obvious effusion. Cartilage difficult to assess due to motion. Bones/carpal alignment: Scattered geodes or degenerative subcortical cysts in the carpus the. Possible erosion is at the articulation of the scaphoid and trapezium. Degenerative spurring at the first carpometacarpal articulation. I do not perceive a carpal fracture or fracture of the metacarpal bases. Other: Subcutaneous edema along the dorsum and radial side of the proximal hand, nonspecific. IMPRESSION: 1. Considerable motion artifact and signal loss due to patient's difficulty cooperating with the exam. 2. Subcutaneous edema in the visualized proximal hand. Please note that the phalanges were not included on today's exam which only includes to the  mid metacarpal level. 3. Degenerative findings in the wrist with scattered subcortical cysts or geodes. Cannot exclude erosive arthropathy at the articulation of the scaphoid and trapezium. Degenerative spurring at the first carpometacarpal articulation. Electronically Signed   By: Van Clines M.D.   On: 05/18/2015 13:41     Scheduled Meds: . aspirin EC  81 mg Oral Daily  . [START ON 05/20/2015] ciprofloxacin  500 mg Oral Q breakfast  . enoxaparin (LOVENOX) injection  40 mg Subcutaneous Q24H  . hydrALAZINE  50 mg Oral 4 times per day  . insulin aspart  0-5 Units  Subcutaneous QHS  . insulin aspart  0-9 Units Subcutaneous TID WC  . insulin glargine  40 Units Subcutaneous Daily  . lisinopril  20 mg Oral Daily  . metoprolol tartrate  50 mg Oral BID  . NIFEdipine  30 mg Oral Daily  . tobramycin  1 drop Right Eye QID   Continuous Infusions: . sodium chloride 75 mL/hr at 05/19/15 [place], MD, PhD [facility] Pager [phone]  If 7PM-7AM, please contact night-coverage [email or web address] Password [facility] 05/19/2015, 4:34 PM

## 2015-05-19 NOTE — Care Management Important Message (Signed)
Important Message  Patient Details  Name: Marcus Weaver MRN: [id number] Date of Birth: 21-Feb-1931   Medicare Important Message Given:  Yes    Azalynn Maxim Abena 05/19/2015, 11:01 AM

## 2015-05-19 NOTE — Consult Note (Signed)
Marcus Weaver is an 82 y.o. male.   Chief Complaint: left ring finger fracture HPI: 82 yo male states he fell and injured left hand 5 days ago.  Reports no previous injury to hand.  Currently with soreness in left hand especially with use.  Seen at [facility] and wound on palmar surface of hand sutured.  Splinted and admitted for other issues.  Has high WBC.  No fevers, chills, night sweats.  Allergies: No Known Allergies  Past Medical History  Diagnosis Date  . Hypertension   . Diabetes mellitus     type2  . Hyperlipidemia   . UTI (lower urinary tract infection)   . CKD (chronic kidney disease)     Past Surgical History  Procedure Laterality Date  . Cataract extraction      Family History: Family History  Problem Relation Age of Onset  . Cancer Neg Hx   . Diabetes Other     1st degree relative  . Hypertension Other     Social History:   reports that he has been smoking Cigarettes.  He has never used smokeless tobacco. He reports that he drinks alcohol. He reports that he does not use illicit drugs.  Medications: Medications Prior to Admission  Medication Sig Dispense Refill  . aspirin EC 81 MG tablet Take 81 mg by mouth daily.    . Insulin Glargine (LANTUS SOLOSTAR) 100 UNIT/ML Solostar Pen Inject 80 units into the skin every morning (Patient taking differently: Inject 70 Units into the skin daily at 10 pm. Inject 80 units into the skin every morning) 90 mL 1  . lisinopril (PRINIVIL,ZESTRIL) 20 MG tablet Take 20 mg by mouth daily.    . metFORMIN (GLUCOPHAGE-XR) 500 MG 24 hr tablet Take 1 tablet (500 mg total) by mouth 2 (two) times daily. 120 tablet 11  . NIFEdipine (PROCARDIA-XL/ADALAT CC) 30 MG 24 hr tablet Take 1 tablet (30 mg total) by mouth daily. 30 tablet 6  . tobramycin (TOBREX) 0.3 % ophthalmic solution Place 1 drop into the right eye 4 (four) times daily.  5  . Alcohol Swabs PADS Use daily for glucose testing. Dx. 250.00 120 each 3  . Blood Glucose Monitoring Suppl  (ACCU-CHEK AVIVA PLUS) w/Device KIT Use to check blood sugar 2 times per day. Dx code e11.9 1 kit 1  . glucose blood (ACCU-CHEK AVIVA PLUS) test strip Use to check blood sugar 2 times per day. Dx code E11.9 100 each 2  . Insulin Pen Needle (B-D ULTRAFINE III SHORT PEN) 31G X 8 MM MISC USE AS DIRECTED 100 each 1  . Lancets (ACCU-CHEK MULTICLIX) lancets Use to check blood sugar 2 times per day. Dx code E11.9 100 each 2  . simvastatin (ZOCOR) 20 MG tablet Take 1 tablet (20 mg total) by mouth at bedtime. (Patient not taking: Reported on 05/05/2015) 90 tablet 1    Results for orders placed or performed during the hospital encounter of 05/15/15 (from the past 48 hour(s))  Glucose, capillary     Status: None   Collection Time: 05/17/15  4:15 PM  Result Value Ref Range   Glucose-Capillary 95 65 - 99 mg/dL   Comment 1 Notify RN   Glucose, capillary     Status: Abnormal   Collection Time: 05/18/15  8:30 AM  Result Value Ref Range   Glucose-Capillary 50 (L) 65 - 99 mg/dL   Comment 1 Notify RN   Culture, blood (Routine X 2) w Reflex to ID Panel  Status: None (Preliminary result)   Collection Time: 05/18/15  9:50 AM  Result Value Ref Range   Specimen Description BLOOD RIGHT ANTECUBITAL    Special Requests IN PEDIATRIC BOTTLE  2CC    Culture NO GROWTH 1 DAY    Report Status PENDING   CBC     Status: Abnormal   Collection Time: 05/18/15  9:54 AM  Result Value Ref Range   WBC 13.0 (H) 4.0 - 10.5 K/uL   RBC 4.16 (L) 4.22 - 5.81 MIL/uL   Hemoglobin 12.1 (L) 13.0 - 17.0 g/dL   HCT 35.7 (L) 39.0 - 52.0 %   MCV 85.8 78.0 - 100.0 fL   MCH 29.1 26.0 - 34.0 pg   MCHC 33.9 30.0 - 36.0 g/dL   RDW 14.1 11.5 - 15.5 %   Platelets 188 150 - 400 K/uL  Basic metabolic panel     Status: Abnormal   Collection Time: 05/18/15  9:54 AM  Result Value Ref Range   Sodium 139 135 - 145 mmol/L   Potassium 4.0 3.5 - 5.1 mmol/L   Chloride 106 101 - 111 mmol/L   CO2 20 (L) 22 - 32 mmol/L   Glucose, Bld 116 (H) 65 -  99 mg/dL   BUN 28 (H) 6 - 20 mg/dL   Creatinine, Ser 2.01 (H) 0.61 - 1.24 mg/dL   Calcium 9.0 8.9 - 10.3 mg/dL   GFR calc non Af Amer 29 (L) >60 mL/min   GFR calc Af Amer 34 (L) >60 mL/min    Comment: (NOTE) The eGFR has been calculated using the CKD EPI equation. This calculation has not been validated in all clinical situations. eGFR's persistently <60 mL/min signify possible Chronic Kidney Disease.    Anion gap 13 5 - 15  Culture, blood (Routine X 2) w Reflex to ID Panel     Status: None (Preliminary result)   Collection Time: 05/18/15 10:00 AM  Result Value Ref Range   Specimen Description BLOOD RIGHT ANTECUBITAL    Special Requests IN PEDIATRIC BOTTLE  2CC    Culture NO GROWTH 1 DAY    Report Status PENDING   Glucose, capillary     Status: Abnormal   Collection Time: 05/18/15 11:19 AM  Result Value Ref Range   Glucose-Capillary 153 (H) 65 - 99 mg/dL  Glucose, capillary     Status: Abnormal   Collection Time: 05/18/15  5:54 PM  Result Value Ref Range   Glucose-Capillary 121 (H) 65 - 99 mg/dL  CBC     Status: Abnormal   Collection Time: 05/19/15  7:38 AM  Result Value Ref Range   WBC 13.2 (H) 4.0 - 10.5 K/uL   RBC 4.08 (L) 4.22 - 5.81 MIL/uL   Hemoglobin 12.3 (L) 13.0 - 17.0 g/dL   HCT 35.8 (L) 39.0 - 52.0 %   MCV 87.7 78.0 - 100.0 fL   MCH 30.1 26.0 - 34.0 pg   MCHC 34.4 30.0 - 36.0 g/dL   RDW 14.4 11.5 - 15.5 %   Platelets 215 150 - 400 K/uL  Basic metabolic panel     Status: Abnormal   Collection Time: 05/19/15  7:38 AM  Result Value Ref Range   Sodium 141 135 - 145 mmol/L   Potassium 4.1 3.5 - 5.1 mmol/L   Chloride 108 101 - 111 mmol/L   CO2 21 (L) 22 - 32 mmol/L   Glucose, Bld 169 (H) 65 - 99 mg/dL   BUN 32 (H) 6 - 20 mg/dL  Creatinine, Ser 1.91 (H) 0.61 - 1.24 mg/dL   Calcium 8.8 (L) 8.9 - 10.3 mg/dL   GFR calc non Af Amer 31 (L) >60 mL/min   GFR calc Af Amer 36 (L) >60 mL/min    Comment: (NOTE) The eGFR has been calculated using the CKD EPI  equation. This calculation has not been validated in all clinical situations. eGFR's persistently <60 mL/min signify possible Chronic Kidney Disease.    Anion gap 12 5 - 15  Glucose, capillary     Status: Abnormal   Collection Time: 05/19/15 11:55 AM  Result Value Ref Range   Glucose-Capillary 246 (H) 65 - 99 mg/dL    Mr Hand Left Wo Contrast  05/19/2015  CLINICAL DATA:  Diabetic patient fell 05/15/2015 with a fracture of the proximal phalanx of the left ring finger. Pain and swelling. Laceration. Initial encounter. EXAM: MRI OF THE LEFT HAND WITHOUT CONTRAST TECHNIQUE: Multiplanar, multisequence MR imaging was performed. No intravenous contrast was administered. COMPARISON:  Plain films left hand 05/15/2015. MRI of the left wrist 05/18/2015 FINDINGS: Intense subcutaneous edema is present about the hand, worst over the dorsum. No focal fluid collection is identified. There is a fracture through the base of the proximal phalanx of the ring finger on the radial side. Virtually no marrow edema is present about the fracture most consistent with subacute to remote injury. Fracture fragment measures 0.4 cm at the articular surface by 0.6 cm craniocaudal and is minimally distracted. No other fracture is identified. There is partial visualization of first CMC and scaphoid trapezium trapezoid joint osteoarthritis. No erosion is identified. Intrinsic musculature of the hand and imaged tendons are intact. IMPRESSION: Subacute to remote appearing fracture through the base of the proximal phalanx of the ring finger on the radial side as described above. Intense subcutaneous edema about the hand. No focal fluid collection or evidence of osteomyelitis is present. First CMC and STT osteoarthritis. Electronically Signed   By: Inge Rise M.D.   On: 05/19/2015 08:06   Mr Wrist Left Wo Contrast  05/18/2015  CLINICAL DATA:  Left hand swelling and laceration. Diabetes. Dementia. Found and neighbors back yard,  confused, disrobed, disoriented, with possible fractures in the hand on radiography. EXAM: MR OF THE LEFT WRIST WITHOUT CONTRAST TECHNIQUE: Multiplanar, multisequence MR imaging of the left wrist was performed. No intravenous contrast was administered. COMPARISON:  None. FINDINGS: Despite efforts by the technologist and patient, motion artifact is present on today's exam and could not be eliminated. This reduces exam sensitivity and specificity. The patient is not able to cooperate well with imaging ; we obtained best exam possible under the circumstances. Please note that the patient was thought to potentially have fractures long the bases of the fourth proximal phalanx; today's exam is of the wrist and not the hand and accordingly this is not included in this particular exam. Ligaments: There is some ill definition of the volar portion of the scapholunate ligament but no scapholunate separation or other indicator that the ligament is torn. Volar and dorsal ligaments of the wrist are very poorly seen due to motion. Triangular fibrocartilage: Grossly intact. No effusion of the distal radial ulnar joint seen. Tendons: Grossly unremarkable. Carpal tunnel/median nerve: High unremarkable Guyon's canal: No impingement identified. Joint/cartilage: No obvious effusion. Cartilage difficult to assess due to motion. Bones/carpal alignment: Scattered geodes or degenerative subcortical cysts in the carpus the. Possible erosion is at the articulation of the scaphoid and trapezium. Degenerative spurring at the first carpometacarpal articulation. I do  not perceive a carpal fracture or fracture of the metacarpal bases. Other: Subcutaneous edema along the dorsum and radial side of the proximal hand, nonspecific. IMPRESSION: 1. Considerable motion artifact and signal loss due to patient's difficulty cooperating with the exam. 2. Subcutaneous edema in the visualized proximal hand. Please note that the phalanges were not included on  today's exam which only includes to the mid metacarpal level. 3. Degenerative findings in the wrist with scattered subcortical cysts or geodes. Cannot exclude erosive arthropathy at the articulation of the scaphoid and trapezium. Degenerative spurring at the first carpometacarpal articulation. Electronically Signed   By: Van Clines M.D.   On: 05/18/2015 13:41     Pertinent items noted in HPI and remainder of comprehensive ROS otherwise negative.  Blood pressure 158/50, pulse 104, temperature 98.5 F (36.9 C), temperature source Oral, resp. rate 18, height _0  (1.727 m), weight 89.495 kg (197 lb 4.8 oz), SpO2 95 %.  General appearance: alert, cooperative and appears stated age Head: Normocephalic, without obvious abnormality, abrasion to left side of face with scleral hemorrhage Neck: supple, symmetrical, trachea midline Extremities: intact sensation and capillary refill all digits.  good soft tissue turgor.  right UE: +epl/fpl/io.  no wounds.  no erythema/ecchymosis.  left ue: able to wiggle all fingers.  ulnar gutter splint in place.  ace wrap has rolled away from appropriate position and is tight.  swelling in hand.  ace wrap and splint removed.  swelling demarcated at splint and ace edges.  no erythema on hand.  swollen dorsally.  wound at proximal flexion crease of long finger with sutures in place and no erythema or proximal streaking.  no drainage.  skin tear on dorsum of index with no erythema or drainage. Pulses: 2+ and symmetric Skin: Skin color, texture, turgor normal. No rashes or lesions Neurologic: Grossly normal Incision/Wound: As above  Assessment/Plan Left ring finger proximal phalanx non displaced fracture and long finger laceration.  XR and MRI reviewed.  Fracture non displaced with minimal signal change to bone on MRI.  May be old fracture.  Edema noted on dorsum of hand. Degenerative changes to carpus.  Do not see signs of infection at this time.  Swelling in hand  likely due to tight dressing.  This was loosened today and patient noted immediate significant relief to left hand discomfort.  Will ask therapy to see and make thermoplast splint.  Keep hand elevated above level of heart to help with edema.  Will follow.  Follow up in office after discharge.  Kyndahl Jablon R 05/19/2015, 2:40 PM

## 2015-05-20 ENCOUNTER — Telehealth: Payer: Self-pay | Admitting: *Deleted

## 2015-05-20 DIAGNOSIS — S61412D Laceration without foreign body of left hand, subsequent encounter: Secondary | ICD-10-CM

## 2015-05-20 LAB — BASIC METABOLIC PANEL
ANION GAP: 12 (ref 5–15)
BUN: 30 mg/dL — ABNORMAL HIGH (ref 6–20)
CALCIUM: 8.6 mg/dL — AB (ref 8.9–10.3)
CO2: 21 mmol/L — AB (ref 22–32)
Chloride: 111 mmol/L (ref 101–111)
Creatinine, Ser: 1.66 mg/dL — ABNORMAL HIGH (ref 0.61–1.24)
GFR calc non Af Amer: 37 mL/min — ABNORMAL LOW (ref >60)
GFR, EST AFRICAN AMERICAN: 42 mL/min — AB (ref >60)
Glucose, Bld: 138 mg/dL — ABNORMAL HIGH (ref 65–99)
Potassium: 4.3 mmol/L (ref 3.5–5.1)
Sodium: 144 mmol/L (ref 135–145)

## 2015-05-20 LAB — CULTURE, BLOOD (ROUTINE X 2)
CULTURE: NO GROWTH
Culture: NO GROWTH

## 2015-05-20 LAB — GLUCOSE, CAPILLARY
GLUCOSE-CAPILLARY: 151 mg/dL — AB (ref 65–99)
Glucose-Capillary: 134 mg/dL — ABNORMAL HIGH (ref 65–99)

## 2015-05-20 MED ORDER — INSULIN GLARGINE 100 UNIT/ML SOLOSTAR PEN: PEN_INJECTOR | SUBCUTANEOUS | Status: DC

## 2015-05-20 NOTE — Progress Notes (Signed)
OT NOTE  Splint removed and skin checked. Pt tolerating splint well. Handout provided to RN, patient and put in ghost chart for splint care following d/c snf. Pt with pending following up with Dr Leanora Cover.   Jeri Modena   OTR/L Pager: [phone] Office: [phone] .1 visit 1 fit charge

## 2015-05-20 NOTE — Progress Notes (Signed)
OT NOTE  OT fabricating splint to Dr Doristine Mango orders: Tailor splint material with L ulnar gutter forearm based splint with MCP 35 degrees 4th and 5th digit inclusion. Dressing placed on laceration at the volar MCP crease. ( saline solution cleaned the area and dressed with 2 by 1 cut to fit wound with tape placed over 3rd digit to hold dressing in place. Pt currently reports comfort in splint and placed on pillows for elevation.   Jeri Modena   OTR/L Pager: [phone] Office: [phone] . 75 dollar splint charge 2 visits 8 fit units

## 2015-05-20 NOTE — Progress Notes (Signed)
Patient will discharge to [facility] Anticipated discharge date:2/28 Family notified: pt daughter Transportation by Corey Harold- scheduled for 2pm Report #: [id number]  Kirby signing off.  Domenica Reamer, [place] Social Worker [phone]

## 2015-05-20 NOTE — Discharge Summary (Addendum)
Physician Discharge Summary  Marcus Weaver QIH:[id number] DOB: [birth year] DOA: 05/15/2015  PCP: Annye Asa, MD  Admit date: 05/15/2015 Discharge date: 05/20/2015  Time spent: > 30 minutes  Recommendations for Outpatient Follow-up:  1. Follow up with Dr. Birdie Riddle in 2-3 weeks 2. Follow up with Dr. Fredna Dow in 1 week  3. Follow up with Dr. Constance Holster in 1 week  Discharge Diagnoses:  Active Problems:   Diabetes mellitus, type II ([place])   Hyperlipidemia associated with type 2 diabetes mellitus ([place])   Uncontrolled hypertension   Acute UTI   Transient disorientation   Fall   Left orbit fracture ([facility])   Laceration of left hand/mid finger   UTI (urinary tract infection)   CKD (chronic kidney disease), stage III  Discharge Condition: stable  Diet recommendation: regular  Filed Weights   05/15/15 1700  Weight: 89.495 kg (197 lb 4.8 oz)    History of present illness:  See H&P, Labs, Consult and Test reports for all details in brief, patient is a 82 year old male with history of diabetes mellitus, hypertension, hyperlipidemia, lives at home with daughter, found in neighbor's backyard, confused, disoriented, with no pants on, workup was significant for UTI.  Hospital Course:  Urinary tract infection - Likely contributed to his altered mental status and confusion, patient started on ceftriaxone empirically. Renal ultrasound without acute findings, urine culture without significant growth, narrow to empiric Cipro for 4 additional days with end date 05/24/15. Acute encephalopathy with in-hospital delirium - resolved, back to baseline. TSH normal. Continue Seroquel Probable dementia - will need outpatient follow up for formal testing Fall/Left orbit fracture - Patient reports typically ambulates with a cane and left the home unassisted without a cane and apparently disoriented so suspect this contributed to patient's falls. EDP discussed with on-call ENT physician [facility] - Dr. Constance Holster) he  states this is an outpatient follow-up. Laceration of left hand/mid finger  - X-ray left hand concerning for a nondisplaced fracture at the base of the ring finger proximal phalanx.  - given fever obtained MRI without apparent active infection, Dr. Fredna Dow evaluated patient on 2/27. Appreciate input. Outpatient follow up. WIll place thermoplast splint prior to d/c Diabetes mellitus, type II - Patient reports recently CBGs have been running higher than usual although he also describes CBGs as being "up and down". CBG dropped to 58 in the ED, his Lantus was originally held, his CBG are starting to creep up today, I will resume his Lantus at a lower dose of 40 units per day. - stable on this regimen Uncontrolled hypertension - continue current regimen CKD III - Cr at baseline, hold Lisinopril. Resume in 2-3 weeks as an outpatient if renal function is stable.  Hyperlipidemia associated with type 2 diabetes mellitus  - Continue statin  Procedures:  None    Consultations:  Hand surgery   Discharge Exam: Filed Vitals:   05/19/15 1455 05/19/15 2120 05/20/15 0005 05/20/15 0508  BP: 147/54 155/57 132/58 163/57  Pulse: 82 98 66 74  Temp: 100.1 F (37.8 C) 101.1 F (38.4 C) 98.2 F (36.8 C) 97.8 F (36.6 C)  TempSrc: Oral Oral Oral Oral  Resp: '18 18  18  '$ Height:      Weight:      SpO2: 97% 96%  97%    General: NAD Cardiovascular: RRR Respiratory: CTA biL  Discharge Instructions Activity:  As tolerated   Get Medicines reviewed and adjusted: Please take all your medications with you for your next visit with your Primary  MD  Please request your Primary MD to go over all hospital tests and procedure/radiological results at the follow up, please ask your Primary MD to get all Hospital records sent to his/her office.  If you experience worsening of your admission symptoms, develop shortness of breath, life threatening emergency, suicidal or homicidal thoughts you must seek medical  attention immediately by calling 911 or calling your MD immediately if symptoms less severe.  You must read complete instructions/literature along with all the possible adverse reactions/side effects for all the Medicines you take and that have been prescribed to you. Take any new Medicines after you have completely understood and accpet all the possible adverse reactions/side effects.   Do not drive when taking Pain medications.   Do not take more than prescribed Pain, Sleep and Anxiety Medications  Special Instructions: If you have smoked or chewed Tobacco in the last 2 yrs please stop smoking, stop any regular Alcohol and or any Recreational drug use.  Wear Seat belts while driving.  Please note  You were cared for by a hospitalist during your hospital stay. Once you are discharged, your primary care physician will handle any further medical issues. Please note that NO REFILLS for any discharge medications will be authorized once you are discharged, as it is imperative that you return to your primary care physician (or establish a relationship with a primary care physician if you do not have one) for your aftercare needs so that they can reassess your need for medications and monitor your lab values.    Medication List    STOP taking these medications        lisinopril 20 MG tablet  Commonly known as:  PRINIVIL,ZESTRIL     metFORMIN 500 MG 24 hr tablet  Commonly known as:  GLUCOPHAGE-XR      TAKE these medications        ACCU-CHEK AVIVA PLUS w/Device Kit  Use to check blood sugar 2 times per day. Dx code e11.9     accu-chek multiclix lancets  Use to check blood sugar 2 times per day. Dx code E11.9     Alcohol Swabs Pads  Use daily for glucose testing. Dx. 250.00     aspirin EC 81 MG tablet  Take 81 mg by mouth daily.     ciprofloxacin 500 MG tablet  Commonly known as:  CIPRO  Take 1 tablet (500 mg total) by mouth 2 (two) times daily.     glucose blood test strip    Commonly known as:  ACCU-CHEK AVIVA PLUS  Use to check blood sugar 2 times per day. Dx code E11.9     hydrALAZINE 25 MG tablet  Commonly known as:  APRESOLINE  Take 1 tablet (25 mg total) by mouth 3 (three) times daily.     Insulin Glargine 100 UNIT/ML Solostar Pen  Commonly known as:  LANTUS SOLOSTAR  Inject 40 units into the skin every morning     Insulin Pen Needle 31G X 8 MM Misc  Commonly known as:  B-D ULTRAFINE III SHORT PEN  USE AS DIRECTED     NIFEdipine 30 MG 24 hr tablet  Commonly known as:  PROCARDIA-XL/ADALAT CC  Take 1 tablet (30 mg total) by mouth daily.     QUEtiapine 25 MG tablet  Commonly known as:  SEROQUEL  Take 0.5 tablets (12.5 mg total) by mouth at bedtime.     simvastatin 20 MG tablet  Commonly known as:  ZOCOR  Take 1 tablet (20  mg total) by mouth at bedtime.     tobramycin 0.3 % ophthalmic solution  Commonly known as:  TOBREX  Place 1 drop into the right eye 4 (four) times daily.     traMADol 50 MG tablet  Commonly known as:  ULTRAM  Take 1 tablet (50 mg total) by mouth every 6 (six) hours as needed for moderate pain.       Follow-up Information    Follow up with Tennis Must, MD. Schedule an appointment as soon as possible for a visit in 3 days.   Specialty:  Orthopedic Surgery   Contact information:   [address] [place] [address] [phone]       Follow up with Izora Gala, MD. Schedule an appointment as soon as possible for a visit in 1 week.   Specialty:  Otolaryngology   Contact information:   [address] [place] [address] [phone]       The results of significant diagnostics from this hospitalization (including imaging, microbiology, ancillary and laboratory) are listed below for reference.    Significant Diagnostic Studies: Ct Head Wo Contrast  05/15/2015  CLINICAL DATA:  Recent fall.  Facial abrasions.  Left eye swelling. EXAM: CT HEAD WITHOUT CONTRAST CT MAXILLOFACIAL WITHOUT CONTRAST  CT CERVICAL SPINE WITHOUT CONTRAST TECHNIQUE: Multidetector CT imaging of the head, cervical spine, and maxillofacial structures were performed using the standard protocol without intravenous contrast. Multiplanar CT image reconstructions of the cervical spine and maxillofacial structures were also generated. COMPARISON:  07/29/2009 FINDINGS: CT HEAD FINDINGS Stable cerebral atrophy with prominent ventricles. Increased periventricular low-density compared to the previous examination. No evidence for acute hemorrhage, mass lesion, midline shift, hydrocephalus or large infarct. Large scalp hematoma along the left forehead. There is extensive left periorbital swelling. Mild mucosal disease in the bilateral maxillary sinuses. There is an old deformity fracture involving the right medial orbital wall. There is a new depressed fracture involving the left medial orbital wall with fluid in the left ethmoid air cells. Mandibular condyles are located. CT MAXILLOFACIAL FINDINGS Large left frontal scalp hematoma. There is extensive soft tissue swelling around the left orbit. Both globes are intact. Mandible is intact. Zygomatic arches are intact. Nasal bones are intact. There is a new fracture involving the left lamina papyracea/ medial orbital wall. There is depression of this fracture towards the midline. There is fluid or blood in the left ethmoid air cells extending into the left maxillary sinus. Mild mucosal disease in the bilateral maxillary sinuses. Pterygoid plates are intact. Mastoid air cells are aerated. Overall, the left inferior orbital wall appears intact but difficult to exclude injury along the medial aspect of the inferior left orbital wall. There is no evidence for extraocular muscle entrapment. Patient has 1 remaining tooth. This tooth has periapical lucency and a caries. Crista galli is midline and intact. Alignment of the upper cervical spine is normal with degenerative endplate changes. CT CERVICAL SPINE  FINDINGS No gross abnormality to the thyroid tissue. There is no significant soft tissue swelling in the neck. No gross abnormality to the submandibular glands or parotid glands. Small lymph nodes on both sides of the neck. Negative for an acute fracture or dislocation in the cervical spine. Negative for a large apical pneumothorax. Broad-based disc bulge with bilateral uncovertebral spurring at C3-C4 causing bilateral foraminal narrowing. Extensive uncovertebral spurring at C4-C5 causing foraminal narrowing. Extensive left foraminal narrowing at C6-C7 due to uncovertebral spurring. Alignment of cervical spine is within normal limits. Multilevel  degenerative endplate and disc disease. Marked disc space narrowing at C7-T1. Opacification of the posterior longitudinal ligament. IMPRESSION: Head CT:  No acute intracranial abnormality. Atrophy and evidence for chronic small vessel ischemic changes. Facial CT: Fracture of the medial left orbital wall/ lamina papyracea. Small amount of fluid and blood in the left ethmoid air cells and left maxillary sinus. Large scalp hematoma involving the left forehead. Extensive soft tissue swelling around the left orbit. Cervical spine CT: Multilevel degenerative changes in the cervical spine. No acute bone abnormality in cervical spine. Electronically Signed   By: Markus Daft M.D.   On: 05/15/2015 07:49   Ct Cervical Spine Wo Contrast  05/15/2015  CLINICAL DATA:  Recent fall.  Facial abrasions.  Left eye swelling. EXAM: CT HEAD WITHOUT CONTRAST CT MAXILLOFACIAL WITHOUT CONTRAST CT CERVICAL SPINE WITHOUT CONTRAST TECHNIQUE: Multidetector CT imaging of the head, cervical spine, and maxillofacial structures were performed using the standard protocol without intravenous contrast. Multiplanar CT image reconstructions of the cervical spine and maxillofacial structures were also generated. COMPARISON:  07/29/2009 FINDINGS: CT HEAD FINDINGS Stable cerebral atrophy with prominent ventricles.  Increased periventricular low-density compared to the previous examination. No evidence for acute hemorrhage, mass lesion, midline shift, hydrocephalus or large infarct. Large scalp hematoma along the left forehead. There is extensive left periorbital swelling. Mild mucosal disease in the bilateral maxillary sinuses. There is an old deformity fracture involving the right medial orbital wall. There is a new depressed fracture involving the left medial orbital wall with fluid in the left ethmoid air cells. Mandibular condyles are located. CT MAXILLOFACIAL FINDINGS Large left frontal scalp hematoma. There is extensive soft tissue swelling around the left orbit. Both globes are intact. Mandible is intact. Zygomatic arches are intact. Nasal bones are intact. There is a new fracture involving the left lamina papyracea/ medial orbital wall. There is depression of this fracture towards the midline. There is fluid or blood in the left ethmoid air cells extending into the left maxillary sinus. Mild mucosal disease in the bilateral maxillary sinuses. Pterygoid plates are intact. Mastoid air cells are aerated. Overall, the left inferior orbital wall appears intact but difficult to exclude injury along the medial aspect of the inferior left orbital wall. There is no evidence for extraocular muscle entrapment. Patient has 1 remaining tooth. This tooth has periapical lucency and a caries. Crista galli is midline and intact. Alignment of the upper cervical spine is normal with degenerative endplate changes. CT CERVICAL SPINE FINDINGS No gross abnormality to the thyroid tissue. There is no significant soft tissue swelling in the neck. No gross abnormality to the submandibular glands or parotid glands. Small lymph nodes on both sides of the neck. Negative for an acute fracture or dislocation in the cervical spine. Negative for a large apical pneumothorax. Broad-based disc bulge with bilateral uncovertebral spurring at C3-C4 causing  bilateral foraminal narrowing. Extensive uncovertebral spurring at C4-C5 causing foraminal narrowing. Extensive left foraminal narrowing at C6-C7 due to uncovertebral spurring. Alignment of cervical spine is within normal limits. Multilevel degenerative endplate and disc disease. Marked disc space narrowing at C7-T1. Opacification of the posterior longitudinal ligament. IMPRESSION: Head CT:  No acute intracranial abnormality. Atrophy and evidence for chronic small vessel ischemic changes. Facial CT: Fracture of the medial left orbital wall/ lamina papyracea. Small amount of fluid and blood in the left ethmoid air cells and left maxillary sinus. Large scalp hematoma involving the left forehead. Extensive soft tissue swelling around the left orbit. Cervical spine CT: Multilevel degenerative  changes in the cervical spine. No acute bone abnormality in cervical spine. Electronically Signed   By: Richarda Overlie M.D.   On: 05/15/2015 07:49   Mr Hand Left Wo Contrast  05/19/2015  CLINICAL DATA:  Diabetic patient fell 05/15/2015 with a fracture of the proximal phalanx of the left ring finger. Pain and swelling. Laceration. Initial encounter. EXAM: MRI OF THE LEFT HAND WITHOUT CONTRAST TECHNIQUE: Multiplanar, multisequence MR imaging was performed. No intravenous contrast was administered. COMPARISON:  Plain films left hand 05/15/2015. MRI of the left wrist 05/18/2015 FINDINGS: Intense subcutaneous edema is present about the hand, worst over the dorsum. No focal fluid collection is identified. There is a fracture through the base of the proximal phalanx of the ring finger on the radial side. Virtually no marrow edema is present about the fracture most consistent with subacute to remote injury. Fracture fragment measures 0.4 cm at the articular surface by 0.6 cm craniocaudal and is minimally distracted. No other fracture is identified. There is partial visualization of first CMC and scaphoid trapezium trapezoid joint  osteoarthritis. No erosion is identified. Intrinsic musculature of the hand and imaged tendons are intact. IMPRESSION: Subacute to remote appearing fracture through the base of the proximal phalanx of the ring finger on the radial side as described above. Intense subcutaneous edema about the hand. No focal fluid collection or evidence of osteomyelitis is present. First CMC and STT osteoarthritis. Electronically Signed   By: Drusilla Kanner M.D.   On: 05/19/2015 08:06   US Renal  05/15/2015  CLINICAL DATA:  Acute kidney injury EXAM: RENAL / URINARY TRACT ULTRASOUND COMPLETE COMPARISON:  None. FINDINGS: Right Kidney: Length: 9 cm without definitive cortical thinning. Echogenicity within normal limits. No mass or hydronephrosis visualized. Left Kidney: Length: 10 cm. Echogenicity within normal limits. No mass or hydronephrosis visualized. Bladder: No over distention or wall thickening. Marked enlargement of the prostate, measuring at least 6.4 cm. IMPRESSION: 1. No acute finding or hydronephrosis. 2. Marked prostate enlargement without urinary retention. Electronically Signed   By: Marnee Spring M.D.   On: 05/15/2015 11:16   Mr Wrist Left Wo Contrast  05/18/2015  CLINICAL DATA:  Left hand swelling and laceration. Diabetes. Dementia. Found and neighbors back yard, confused, disrobed, disoriented, with possible fractures in the hand on radiography. EXAM: MR OF THE LEFT WRIST WITHOUT CONTRAST TECHNIQUE: Multiplanar, multisequence MR imaging of the left wrist was performed. No intravenous contrast was administered. COMPARISON:  None. FINDINGS: Despite efforts by the technologist and patient, motion artifact is present on today's exam and could not be eliminated. This reduces exam sensitivity and specificity. The patient is not able to cooperate well with imaging ; we obtained best exam possible under the circumstances. Please note that the patient was thought to potentially have fractures long the bases of the  fourth proximal phalanx; today's exam is of the wrist and not the hand and accordingly this is not included in this particular exam. Ligaments: There is some ill definition of the volar portion of the scapholunate ligament but no scapholunate separation or other indicator that the ligament is torn. Volar and dorsal ligaments of the wrist are very poorly seen due to motion. Triangular fibrocartilage: Grossly intact. No effusion of the distal radial ulnar joint seen. Tendons: Grossly unremarkable. Carpal tunnel/median nerve: High unremarkable Guyon's canal: No impingement identified. Joint/cartilage: No obvious effusion. Cartilage difficult to assess due to motion. Bones/carpal alignment: Scattered geodes or degenerative subcortical cysts in the carpus the. Possible erosion is at the  articulation of the scaphoid and trapezium. Degenerative spurring at the first carpometacarpal articulation. I do not perceive a carpal fracture or fracture of the metacarpal bases. Other: Subcutaneous edema along the dorsum and radial side of the proximal hand, nonspecific. IMPRESSION: 1. Considerable motion artifact and signal loss due to patient's difficulty cooperating with the exam. 2. Subcutaneous edema in the visualized proximal hand. Please note that the phalanges were not included on today's exam which only includes to the mid metacarpal level. 3. Degenerative findings in the wrist with scattered subcortical cysts or geodes. Cannot exclude erosive arthropathy at the articulation of the scaphoid and trapezium. Degenerative spurring at the first carpometacarpal articulation. Electronically Signed   By: Van Clines M.D.   On: 05/18/2015 13:41   Dg Hand Complete Left  05/15/2015  CLINICAL DATA:  [place] this morning. Cuts on the second, third and fourth fingers. EXAM: LEFT HAND - COMPLETE 3+ VIEW COMPARISON:  None. FINDINGS: Lucency and mild cortical irregularity involving the base of the index finger proximal phalanx. This is  concerning for a nondisplaced fracture. This presumed fracture does involve the MCP joint. Mild cortical irregularity involving the index finger proximal phalanx near the PIP joint but there is not a definite fracture at this location. Mild degenerative changes in the DIP joints, particularly at the middle finger. Degenerative changes at the first carpometacarpal joint. Chondrocalcinosis along the ulnar aspect of the wrist joint. IMPRESSION: Concern for a nondisplaced fracture at the base of the ring finger proximal phalanx. Recommend clinical correlation in this area. Degenerative changes as described. Electronically Signed   By: Markus Daft M.D.   On: 05/15/2015 07:10   Ct Maxillofacial Wo Cm  05/15/2015  CLINICAL DATA:  Recent fall.  Facial abrasions.  Left eye swelling. EXAM: CT HEAD WITHOUT CONTRAST CT MAXILLOFACIAL WITHOUT CONTRAST CT CERVICAL SPINE WITHOUT CONTRAST TECHNIQUE: Multidetector CT imaging of the head, cervical spine, and maxillofacial structures were performed using the standard protocol without intravenous contrast. Multiplanar CT image reconstructions of the cervical spine and maxillofacial structures were also generated. COMPARISON:  07/29/2009 FINDINGS: CT HEAD FINDINGS Stable cerebral atrophy with prominent ventricles. Increased periventricular low-density compared to the previous examination. No evidence for acute hemorrhage, mass lesion, midline shift, hydrocephalus or large infarct. Large scalp hematoma along the left forehead. There is extensive left periorbital swelling. Mild mucosal disease in the bilateral maxillary sinuses. There is an old deformity fracture involving the right medial orbital wall. There is a new depressed fracture involving the left medial orbital wall with fluid in the left ethmoid air cells. Mandibular condyles are located. CT MAXILLOFACIAL FINDINGS Large left frontal scalp hematoma. There is extensive soft tissue swelling around the left orbit. Both globes are  intact. Mandible is intact. Zygomatic arches are intact. Nasal bones are intact. There is a new fracture involving the left lamina papyracea/ medial orbital wall. There is depression of this fracture towards the midline. There is fluid or blood in the left ethmoid air cells extending into the left maxillary sinus. Mild mucosal disease in the bilateral maxillary sinuses. Pterygoid plates are intact. Mastoid air cells are aerated. Overall, the left inferior orbital wall appears intact but difficult to exclude injury along the medial aspect of the inferior left orbital wall. There is no evidence for extraocular muscle entrapment. Patient has 1 remaining tooth. This tooth has periapical lucency and a caries. Crista galli is midline and intact. Alignment of the upper cervical spine is normal with degenerative endplate changes. CT CERVICAL SPINE FINDINGS No  gross abnormality to the thyroid tissue. There is no significant soft tissue swelling in the neck. No gross abnormality to the submandibular glands or parotid glands. Small lymph nodes on both sides of the neck. Negative for an acute fracture or dislocation in the cervical spine. Negative for a large apical pneumothorax. Broad-based disc bulge with bilateral uncovertebral spurring at C3-C4 causing bilateral foraminal narrowing. Extensive uncovertebral spurring at C4-C5 causing foraminal narrowing. Extensive left foraminal narrowing at C6-C7 due to uncovertebral spurring. Alignment of cervical spine is within normal limits. Multilevel degenerative endplate and disc disease. Marked disc space narrowing at C7-T1. Opacification of the posterior longitudinal ligament. IMPRESSION: Head CT:  No acute intracranial abnormality. Atrophy and evidence for chronic small vessel ischemic changes. Facial CT: Fracture of the medial left orbital wall/ lamina papyracea. Small amount of fluid and blood in the left ethmoid air cells and left maxillary sinus. Large scalp hematoma involving  the left forehead. Extensive soft tissue swelling around the left orbit. Cervical spine CT: Multilevel degenerative changes in the cervical spine. No acute bone abnormality in cervical spine. Electronically Signed   By: Richarda Overlie M.D.   On: 05/15/2015 07:49    Microbiology: Recent Results (from the past 240 hour(s))  Urine culture     Status: None   Collection Time: 05/15/15  6:20 AM  Result Value Ref Range Status   Specimen Description URINE, RANDOM  Final   Special Requests NONE  Final   Culture MULTIPLE SPECIES PRESENT, SUGGEST RECOLLECTION  Final   Report Status 05/16/2015 FINAL  Final  Culture, blood (Routine X 2) w Reflex to ID Panel     Status: None (Preliminary result)   Collection Time: 05/15/15 10:06 AM  Result Value Ref Range Status   Specimen Description BLOOD RIGHT HAND  Final   Special Requests BOTTLES DRAWN AEROBIC AND ANAEROBIC  Final   Culture NO GROWTH 4 DAYS  Final   Report Status PENDING  Incomplete  Culture, blood (Routine X 2) w Reflex to ID Panel     Status: None (Preliminary result)   Collection Time: 05/15/15 10:12 AM  Result Value Ref Range Status   Specimen Description BLOOD RIGHT ANTECUBITAL  Final   Special Requests   Final    BOTTLES DRAWN AEROBIC AND ANAEROBIC  10CC BLUE 5 CC RED   Culture NO GROWTH 4 DAYS  Final   Report Status PENDING  Incomplete  Culture, blood (Routine X 2) w Reflex to ID Panel     Status: None (Preliminary result)   Collection Time: 05/18/15  9:50 AM  Result Value Ref Range Status   Specimen Description BLOOD RIGHT ANTECUBITAL  Final   Special Requests IN PEDIATRIC BOTTLE  2CC  Final   Culture NO GROWTH 1 DAY  Final   Report Status PENDING  Incomplete  Culture, blood (Routine X 2) w Reflex to ID Panel     Status: None (Preliminary result)   Collection Time: 05/18/15 10:00 AM  Result Value Ref Range Status   Specimen Description BLOOD RIGHT ANTECUBITAL  Final   Special Requests IN PEDIATRIC BOTTLE  [facility]  Final   Culture  NO GROWTH 1 DAY  Final   Report Status PENDING  Incomplete     Labs: Basic Metabolic Panel:  Recent Labs Lab 05/15/15 0644 05/15/15 1628 05/16/15 0717 05/17/15 0706 05/18/15 0954 05/19/15 0738 05/20/15 0801  NA 142  --  139 140 139 141 144  K 4.0  --  4.0 3.7 4.0 4.1 4.3  CL 110  --  108 108 106 108 111  CO2 22  --  18* 22 20* 21* 21*  GLUCOSE 102*  --  182* 104* 116* 169* 138*  BUN 13  --  15 22* 28* 32* 30*  CREATININE 1.30*  --  1.61* 1.67* 2.01* 1.91* 1.66*  CALCIUM 9.2  --  9.2 9.0 9.0 8.8* 8.6*  MG  --  1.7 1.7  --   --   --   --   PHOS  --  2.1* 2.8  --   --   --   --    Liver Function Tests:  Recent Labs Lab 05/15/15 0644  AST 32  ALT 15*  ALKPHOS 67  BILITOT 0.3  PROT 6.7  ALBUMIN 3.1*   CBC:  Recent Labs Lab 05/15/15 0644 05/16/15 0717 05/17/15 0706 05/18/15 0954 05/19/15 0738  WBC 9.7 12.5* 13.7* 13.0* 13.2*  NEUTROABS 7.6  --   --   --   --   HGB 14.5 14.0 12.8* 12.1* 12.3*  HCT 42.1 40.7 36.7* 35.7* 35.8*  MCV 87.2 85.5 86.4 85.8 87.7  PLT 200 192 183 188 215   CBG:  Recent Labs Lab 05/18/15 1754 05/19/15 1155 05/19/15 1713 05/19/15 2118 05/20/15 0817  GLUCAP 121* 246* 162* 199* 134*       Signed:  Mataeo Ingwersen  [facility] 05/20/2015, 10:02 AM

## 2015-05-20 NOTE — Telephone Encounter (Signed)
Received RN Evaluation for start of care Orders; forwarded to provider/SLS 02/28

## 2015-05-20 NOTE — Clinical Social Work Placement (Signed)
   CLINICAL SOCIAL WORK PLACEMENT  NOTE  Date:  05/20/2015  Patient Details  Name: Marcus Weaver MRN: [id number] Date of Birth: 1930/10/25  Clinical Social Work is seeking post-discharge placement for this patient at the [place] level of care (*CSW will initial, date and re-position this form in  chart as items are completed):  Yes   Patient/family provided with [place] Work Department's list of facilities offering this level of care within the geographic area requested by the patient (or if unable, by the patient's family).  Yes   Patient/family informed of their freedom to choose among providers that offer the needed level of care, that participate in Medicare, Medicaid or managed care program needed by the patient, have an available bed and are willing to accept the patient.  Yes   Patient/family informed of [facility]'s ownership interest in [facility] and [facility], as well as of the fact that they are under no obligation to receive care at these facilities.  PASRR submitted to EDS on 05/17/15     PASRR number received on 05/17/15     Existing PASRR number confirmed on       FL2 transmitted to all facilities in geographic area requested by pt/family on 05/17/15     FL2 transmitted to all facilities within larger geographic area on       Patient informed that his/her managed care company has contracts with or will negotiate with certain facilities, including the following:        Yes   Patient/family informed of bed offers received.  Patient chooses bed at [facility]     Physician recommends and patient chooses bed at      Patient to be transferred to [facility] on 05/20/15.  Patient to be transferred to facility by ptar     Patient family notified on 05/20/15 of transfer.  Name of family member notified:  Lawerance Sabal (dtr)     PHYSICIAN Please sign FL2     Additional Comment:     _______________________________________________ [place] Mon, LCSW 05/20/2015, 2:20 PM

## 2015-05-20 NOTE — Progress Notes (Signed)
CSW spoke with pt daughter concerning bed offers- daughter chooses [facility] confirmed that facility would have a bed if patient is stable for DC today  CSW initiated [facility] for anticipated DC today  CSW will continue to follow  Domenica Reamer, [place] Social Worker [phone]

## 2015-05-20 NOTE — Progress Notes (Signed)
Report given to nurse at [facility] healthcare center. Pt's belongings sent with his son. Hoover Brunette, RN

## 2015-05-20 NOTE — Care Management Note (Signed)
Case Management Note  Patient Details  Name: Elvin Giovino MRN: [id number] Date of Birth: August 22, 1930  Subjective/Objective:                    Action/Plan:  DC to SNF as facilitated by CSW  Expected Discharge Date:                  Expected Discharge Plan:  [place]  In-House Referral:     Discharge planning Services  CM Consult  Post Acute Care Choice:    Choice offered to:  Adult Children  DME Arranged:  3-N-1 DME Agency:     HH Arranged:    HH Agency:     Status of Service:  Completed, signed off  Medicare Important Message Given:  Yes Date Medicare IM Given:    Medicare IM give by:    Date Additional Medicare IM Given:    Additional Medicare Important Message give by:     If discussed at [place] of Stay Meetings, dates discussed:    Additional Comments:  Carles Collet, RN 05/20/2015, 12:12 PM

## 2015-05-20 NOTE — Progress Notes (Signed)
OT NOTE Splint Wear and Care   Patient Name: ________[name]______________________ Date: ____2/28/17_____________   General splint information:  * Wear your splint(s) on your____L HAND_____________________________________.  - If you notice/experience increased pain, swelling, or redness (from the splint(s)) that doesn't go away after 15 minutes of removing the splint(s), take the splint off and notify your therapist.  - The first 24-48 hours, remove the splint every 2-4 hours and monitor your skin for signs of redness, or swelling.  - Keep the splint(s) away from heat sources like a furnace, stove, and a car on a hot sunny day. The splints are made from thermoplastic materials and if heated up will lose their shape and no longer fit appropriately.  -Splint(s) can be cleaned with warm soapy water or alcohol swabs  (daily, __3_X /week, weekly)  Wearing Schedule:  * Wear your splint(s) _ALL_________ hours during the day.  * Wear your splint(s) ____ALL______hours during the night  .  If you have questions or concerns:  contact ____[facility]rynn Jones____________________, occupational therapy  Acute Rehab Department  [phone]     Jeri Modena   OTR/L Pager: [phone] Office: [phone] .

## 2015-05-21 ENCOUNTER — Telehealth: Payer: Self-pay | Admitting: Behavioral Health

## 2015-05-21 NOTE — Telephone Encounter (Signed)
Attempted to reach patient for TCM/Hospital Follow-up. Left message for patient to return call when available.

## 2015-05-22 NOTE — Telephone Encounter (Signed)
Per [facility]-Transitional Care Report, the patient is at a skilled nursing facility. TCM/Hospital Follow-up cannot be done at this time.

## 2015-05-23 LAB — CULTURE, BLOOD (ROUTINE X 2)
CULTURE: NO GROWTH
CULTURE: NO GROWTH

## 2015-06-03 ENCOUNTER — Encounter: Payer: PPO | Admitting: Neurology

## 2015-06-11 ENCOUNTER — Ambulatory Visit (INDEPENDENT_AMBULATORY_CARE_PROVIDER_SITE_OTHER): Payer: Self-pay | Admitting: Podiatry

## 2015-06-11 ENCOUNTER — Telehealth: Payer: Self-pay | Admitting: Family Medicine

## 2015-06-11 ENCOUNTER — Telehealth: Payer: Self-pay | Admitting: Endocrinology

## 2015-06-11 ENCOUNTER — Encounter: Payer: Self-pay | Admitting: Podiatry

## 2015-06-11 ENCOUNTER — Ambulatory Visit: Payer: PPO | Admitting: Podiatry

## 2015-06-11 DIAGNOSIS — B351 Tinea unguium: Secondary | ICD-10-CM

## 2015-06-11 DIAGNOSIS — M79674 Pain in right toe(s): Secondary | ICD-10-CM

## 2015-06-11 DIAGNOSIS — M79675 Pain in left toe(s): Secondary | ICD-10-CM

## 2015-06-11 NOTE — Telephone Encounter (Signed)
Caller name: LeStarr from [facility]  Relation to pt: RN  Call back number: [phone]    Reason for call:  Requesting verbal orders regarding home health and medication management and assessment. 1x 1 2x 4 and 1x 4. (patient has 4 sutures that need to be removed appointment scheduled for 06/19/15 at 4pm.

## 2015-06-11 NOTE — Telephone Encounter (Signed)
Pt daughter would like a call back from you, she has a question about a certain medication

## 2015-06-11 NOTE — Patient Instructions (Signed)
Diabetes and Foot Care Diabetes may cause you to have problems because of poor blood supply (circulation) to your feet and legs. This may cause the skin on your feet to become thinner, break easier, and heal more slowly. Your skin may become dry, and the skin may peel and crack. You may also have nerve damage in your legs and feet causing decreased feeling in them. You may not notice minor injuries to your feet that could lead to infections or more serious problems. Taking care of your feet is one of the most important things you can do for yourself.  HOME CARE INSTRUCTIONS  Wear shoes at all times, even in the house. Do not go barefoot. Bare feet are easily injured.  Check your feet daily for blisters, cuts, and redness. If you cannot see the bottom of your feet, use a mirror or ask someone for help.  Wash your feet with warm water (do not use hot water) and mild soap. Then pat your feet and the areas between your toes until they are completely dry. Do not soak your feet as this can dry your skin.  Apply a moisturizing lotion or petroleum jelly (that does not contain alcohol and is unscented) to the skin on your feet and to dry, brittle toenails. Do not apply lotion between your toes.  Trim your toenails straight across. Do not dig under them or around the cuticle. File the edges of your nails with an emery board or nail file.  Do not cut corns or calluses or try to remove them with medicine.  Wear clean socks or stockings every day. Make sure they are not too tight. Do not wear knee-high stockings since they may decrease blood flow to your legs.  Wear shoes that fit properly and have enough cushioning. To break in new shoes, wear them for just a few hours a day. This prevents you from injuring your feet. Always look in your shoes before you put them on to be sure there are no objects inside.  Do not cross your legs. This may decrease the blood flow to your feet.  If you find a minor scrape,  cut, or break in the skin on your feet, keep it and the skin around it clean and dry. These areas may be cleansed with mild soap and water. Do not cleanse the area with peroxide, alcohol, or iodine.  When you remove an adhesive bandage, be sure not to damage the skin around it.  If you have a wound, look at it several times a day to make sure it is healing.  Do not use heating pads or hot water bottles. They may burn your skin. If you have lost feeling in your feet or legs, you may not know it is happening until it is too late.  Make sure your health care provider performs a complete foot exam at least annually or more often if you have foot problems. Report any cuts, sores, or bruises to your health care provider immediately. SEEK MEDICAL CARE IF:   You have an injury that is not healing.  You have cuts or breaks in the skin.  You have an ingrown nail.  You notice redness on your legs or feet.  You feel burning or tingling in your legs or feet.  You have pain or cramps in your legs and feet.  Your legs or feet are numb.  Your feet always feel cold. SEEK IMMEDIATE MEDICAL CARE IF:   There is increasing redness,   swelling, or pain in or around a wound.  There is a red line that goes up your leg.  Pus is coming from a wound.  You develop a fever or as directed by your health care provider.  You notice a bad smell coming from an ulcer or wound.   This information is not intended to replace advice given to you by your health care provider. Make sure you discuss any questions you have with your health care provider.   Document Released: 03/05/2000 Document Revised: 11/08/2012 Document Reviewed: 08/15/2012 Elsevier Interactive Patient Education 2016 Elsevier Inc.  

## 2015-06-11 NOTE — Telephone Encounter (Signed)
Left a vm requesting a call back from the pt's daughter Lawerance Sabal).

## 2015-06-11 NOTE — Telephone Encounter (Signed)
[facility] RN called back to request  Marcus Weaver to come in to bath patient twice a week and PT to evaluate and treat.

## 2015-06-12 NOTE — Progress Notes (Signed)
Patient ID: Marcus Weaver, male   DOB: 09/29/1930, 82 y.o.   MRN: [id number]    Subjective: This patient presents today with his daughter treatment room who is requesting debridement of her others thickened and elongated and painful toenails. The last visit for this service was on 06/25/2013  Patient is diabetic without history of amputation or claudication  Objective: Patient appears confused  Vascular: Trace palpable DP and PT pulses bilaterally Capillary reflex delay bilaterally  Neurological: Patient has difficulty responding to 10 g monofilament wire and vibratory sensation Ankle reflex equal and reactive bilaterally  Dermatological: No open skin lesions bilaterally The toenails are extremely elongated, hypertrophic, deformed, discolored and tender to direct palpation 6-10  Musculoskeletal: HAV deformities bilaterally Hammertoe second left  Assessment: Type II diabetic Decrease pedal pulses bilaterally without any open skin lesions bilaterally Symptomatic onychomycoses 6-10  Plan: Debridement of toenails 6-10 mechanically and likely without any bleeding  Reappoint intervals recommended at 3 months 

## 2015-06-12 NOTE — Telephone Encounter (Signed)
Verbal ok given.

## 2015-06-17 ENCOUNTER — Telehealth: Payer: Self-pay | Admitting: Family Medicine

## 2015-06-17 ENCOUNTER — Encounter: Payer: Self-pay | Admitting: General Practice

## 2015-06-17 NOTE — Telephone Encounter (Signed)
Caller name: Costella Hatcher with [facility] OT Can be reached: [phone]  Reason for call: asking for social work consult for pt. Daughters boyfriend is currently caring for him but he needs to get back to work. They need to get resources in place to have him taken care of during the day. Also wanting RX for tub bench.

## 2015-06-17 NOTE — Telephone Encounter (Signed)
Clair Gulling called back asking for an appt for pt to be seen tomorrow for UTI, schedule is full and asking if Dr Birdie Riddle would call in an antibiotic w/o seeing pt

## 2015-06-18 NOTE — Telephone Encounter (Signed)
Pt has been moved to 11:00 time with 30 mins for appt on 3/30.

## 2015-06-18 NOTE — Telephone Encounter (Signed)
Per Dr. Birdie Riddle pt can keep 4pm in regards to UTI tomorrow, come in for a 30 minute appt tomorrow, or be seen today at another location.

## 2015-06-18 NOTE — Telephone Encounter (Signed)
Unfortunately for [place] to order social work or DME (shower bench) we need to have a face to face encounter within the last 30 days- which we don't have.  Also, with his ongoing issues, it is best to have him seen rather than just prescribe medication.  If pt can't be seen here today, he can likely be seen at another office

## 2015-06-19 ENCOUNTER — Other Ambulatory Visit (INDEPENDENT_AMBULATORY_CARE_PROVIDER_SITE_OTHER): Payer: Commercial Managed Care - HMO

## 2015-06-19 ENCOUNTER — Ambulatory Visit (INDEPENDENT_AMBULATORY_CARE_PROVIDER_SITE_OTHER): Payer: Commercial Managed Care - HMO | Admitting: Family Medicine

## 2015-06-19 ENCOUNTER — Encounter: Payer: Self-pay | Admitting: Family Medicine

## 2015-06-19 VITALS — BP 152/68 | HR 76 | Temp 97.9°F | Resp 16 | Ht 68.0 in | Wt 190.0 lb

## 2015-06-19 DIAGNOSIS — E1136 Type 2 diabetes mellitus with diabetic cataract: Secondary | ICD-10-CM | POA: Diagnosis not present

## 2015-06-19 DIAGNOSIS — F03918 Unspecified dementia, unspecified severity, with other behavioral disturbance: Secondary | ICD-10-CM

## 2015-06-19 DIAGNOSIS — I10 Essential (primary) hypertension: Secondary | ICD-10-CM | POA: Diagnosis not present

## 2015-06-19 DIAGNOSIS — S0282XD Fracture of other specified skull and facial bones, left side, subsequent encounter for fracture with routine healing: Secondary | ICD-10-CM

## 2015-06-19 DIAGNOSIS — S61412D Laceration without foreign body of left hand, subsequent encounter: Secondary | ICD-10-CM | POA: Diagnosis not present

## 2015-06-19 DIAGNOSIS — W19XXXD Unspecified fall, subsequent encounter: Secondary | ICD-10-CM | POA: Diagnosis not present

## 2015-06-19 DIAGNOSIS — G9341 Metabolic encephalopathy: Secondary | ICD-10-CM | POA: Diagnosis not present

## 2015-06-19 DIAGNOSIS — Z794 Long term (current) use of insulin: Secondary | ICD-10-CM

## 2015-06-19 DIAGNOSIS — F0391 Unspecified dementia with behavioral disturbance: Secondary | ICD-10-CM

## 2015-06-19 LAB — BASIC METABOLIC PANEL
BUN: 18 mg/dL (ref 6–23)
CALCIUM: 9.7 mg/dL (ref 8.4–10.5)
CO2: 25 meq/L (ref 19–32)
Chloride: 106 mEq/L (ref 96–112)
Creatinine, Ser: 1.62 mg/dL — ABNORMAL HIGH (ref 0.40–1.50)
GFR: 52.58 mL/min — AB (ref >60.00)
Glucose, Bld: 205 mg/dL — ABNORMAL HIGH (ref 70–99)
Potassium: 4.5 mEq/L (ref 3.5–5.1)
SODIUM: 139 meq/L (ref 135–145)

## 2015-06-19 NOTE — Progress Notes (Signed)
   Subjective:    Patient ID: Marcus Weaver, male    DOB: [birth year], 82 y.o.   MRN: [id number]  [facility] f/u- Pt was admitted 2/23-28 after being found in neighbor's yard, altered w/o pants.  Found to have UTI.  Treated w/ ceftriaxone and then Cipro- ending on 3/4.  Pt was found to have L orbit fx and was to have outpt f/u w/ ENT.  He also had a laceration of L hand and was to have f/u w/ Dr Fredna Dow.  Needs to have stitches removed.  Is getting home health PT/OT/ST through [place].  Due to hypoglycemic episodes, pt's Lantus was decreased to 40units.  Due to elevated Cr his Lisinopril was held w/ the plan of restarting in 2-3 weeks if renal fxn is stable.  Pt was d/c'd to rehab facility but he is now back home w/ daughter.  Daughter reports he is disoriented and unable to remain alone.  Pt has tendency to wander.  Not currently on Seroquel due to insurance reasons- plans to get this medication in April when it is covered.  Due to frequent falls, pt needs a shower chair.  No falls since hospital d/c.  Ambulating w/ a walker.  Pt denies facial pain, HAs.  Needs Hand, ENT, and podiatry referrals.   Review of Systems For ROS see HPI     Objective:   Physical Exam  Constitutional: He appears well-developed and well-nourished. No distress.  HENT:  Head: Normocephalic and atraumatic.  Eyes: Conjunctivae and EOM are normal. Pupils are equal, round, and reactive to light.  Neck: Normal range of motion. Neck supple. No thyromegaly present.  Cardiovascular: Normal rate, regular rhythm and intact distal pulses.   Pulmonary/Chest: Effort normal and breath sounds normal. No respiratory distress. He has no wheezes. He has no rales.  Lymphadenopathy:    He has no cervical adenopathy.  Neurological: He is alert. No cranial nerve deficit.  Shuffling gait Oriented to person and place- some difficulty answering questions  Skin: Skin is warm and dry. No erythema.  Well healing laceration of L hand- 4 sutures  removed w/o difficulty.  Psychiatric: His behavior is normal.  Flat affect Tangential thought process  Vitals reviewed.         Assessment & Plan:

## 2015-06-19 NOTE — Patient Instructions (Signed)
Follow up in 4-6 weeks to recheck blood pressure We'll notify you of your lab results and determine if we can restart the Lisinopril We'll reach out to [place] to expand your home health services I put in the referrals for ENT, Podiatry, hand surgery Call with any questions or concerns Hang in there!!!

## 2015-06-19 NOTE — Progress Notes (Signed)
Pre visit review using our clinic review tool, if applicable. No additional management support is needed unless otherwise documented below in the visit note. 

## 2015-06-20 ENCOUNTER — Other Ambulatory Visit: Payer: Commercial Managed Care - HMO

## 2015-06-20 DIAGNOSIS — F0391 Unspecified dementia with behavioral disturbance: Secondary | ICD-10-CM

## 2015-06-20 DIAGNOSIS — F03918 Unspecified dementia, unspecified severity, with other behavioral disturbance: Secondary | ICD-10-CM

## 2015-06-22 LAB — URINE CULTURE

## 2015-06-27 ENCOUNTER — Telehealth: Payer: Self-pay | Admitting: General Practice

## 2015-06-27 MED ORDER — LISINOPRIL 20 MG PO TABS: 20.0000 mg | ORAL_TABLET | Freq: Every day | ORAL | Status: DC

## 2015-06-27 NOTE — Telephone Encounter (Signed)
Agree w/ advice given- he needs to restart his Lisinopril and monitor BP closely as we may need to make additional modifications.  But he definitely needs to limit his salt intake

## 2015-06-27 NOTE — Telephone Encounter (Signed)
Noted  

## 2015-06-27 NOTE — Telephone Encounter (Signed)
Received a phone call from [place], She advised that she took pt BP reading this morning 220/120 (right arm), 230/120 (left arm) Pulse of 68. Pt denies any shortness of breath, Headache, Chest Pain, blurred Vision, or dizziness. Pt states he took all of his medications this morning around 10am and ate breakfast. When pt was asked if he had eaten anything new he advised that he had a 1/2 rack of BBQ ribs last night and sprite for breakfast. Pt was asked if he had begun his lisinopril as advised on 06/20/15 pt stated that he did not know he was suppose to be on the medication, (this was discussed with pt daughter as per phone note on 06/20/15). [facility] nurse advised on the pharmacy that patient uses and new prescription was filled today. Pt was advised to drink plenty of fluids to help hydrate and flush extra salt out of his system.    Nurse is going to leave a message to have pt daughter check his BP when she returns home and call the office to advise.

## 2015-06-28 NOTE — Assessment & Plan Note (Signed)
Ongoing issue for pt.  Due to acute rise in Cr during his recent hospitalization his Lisinopril was held w/ plans to restart this in 2-3 weeks if Cr was stable.  Check BMP today and restart Lisinopril if able.

## 2015-06-28 NOTE — Assessment & Plan Note (Signed)
New.  Pt is at high risk due to his worsening cognitive status and his tendency to wander.  Pt told today not to attempt to ambulate w/o his walker- pt expressed agreement.  He is also in need of a shower chair for safety.  Will have [place] do a home safety evaluation to determine what other needs pt may have.  Will follow.

## 2015-06-28 NOTE — Assessment & Plan Note (Signed)
New to provider.  Pt has not had ENT f/u as recommended.  Denies pain at this time.  Referral placed to ensure f/u.

## 2015-06-28 NOTE — Assessment & Plan Note (Signed)
New to provider.  Area is well healed and sutures were removed today. He was to have f/u w/ hand surgery and due to decreased ROM of finger flexion on L hand will refer.

## 2015-06-28 NOTE — Assessment & Plan Note (Signed)
Chronic problem.  Following w/ Endo.  Lantus was adjusted due to recent hypoglycemic episodes- which also place him at greater risk for falls.

## 2015-07-07 ENCOUNTER — Ambulatory Visit: Payer: PPO | Admitting: Endocrinology

## 2015-07-07 ENCOUNTER — Telehealth: Payer: Self-pay | Admitting: General Practice

## 2015-07-07 DIAGNOSIS — R410 Disorientation, unspecified: Secondary | ICD-10-CM | POA: Diagnosis not present

## 2015-07-07 DIAGNOSIS — Z9181 History of falling: Secondary | ICD-10-CM | POA: Diagnosis not present

## 2015-07-07 NOTE — Telephone Encounter (Signed)
HH orders from [facility] received, Signed by PCP, and faxed back.

## 2015-07-09 ENCOUNTER — Ambulatory Visit (INDEPENDENT_AMBULATORY_CARE_PROVIDER_SITE_OTHER): Payer: Commercial Managed Care - HMO | Admitting: Family Medicine

## 2015-07-09 ENCOUNTER — Encounter: Payer: Self-pay | Admitting: Family Medicine

## 2015-07-09 VITALS — BP 152/68 | HR 85 | Temp 97.9°F | Resp 17 | Ht 68.0 in | Wt 187.4 lb

## 2015-07-09 DIAGNOSIS — E1136 Type 2 diabetes mellitus with diabetic cataract: Secondary | ICD-10-CM | POA: Diagnosis not present

## 2015-07-09 DIAGNOSIS — I10 Essential (primary) hypertension: Secondary | ICD-10-CM

## 2015-07-09 DIAGNOSIS — Z0000 Encounter for general adult medical examination without abnormal findings: Secondary | ICD-10-CM | POA: Diagnosis not present

## 2015-07-09 DIAGNOSIS — E1169 Type 2 diabetes mellitus with other specified complication: Secondary | ICD-10-CM

## 2015-07-09 DIAGNOSIS — F0390 Unspecified dementia without behavioral disturbance: Secondary | ICD-10-CM

## 2015-07-09 DIAGNOSIS — E785 Hyperlipidemia, unspecified: Secondary | ICD-10-CM | POA: Diagnosis not present

## 2015-07-09 DIAGNOSIS — Z125 Encounter for screening for malignant neoplasm of prostate: Secondary | ICD-10-CM | POA: Diagnosis not present

## 2015-07-09 DIAGNOSIS — Z794 Long term (current) use of insulin: Secondary | ICD-10-CM | POA: Diagnosis not present

## 2015-07-09 LAB — HEPATIC FUNCTION PANEL
ALT: 9 U/L (ref 0–53)
AST: 12 U/L (ref 0–37)
Albumin: 3.6 g/dL (ref 3.5–5.2)
Alkaline Phosphatase: 63 U/L (ref 39–117)
Bilirubin, Direct: 0.1 mg/dL (ref 0.0–0.3)
TOTAL PROTEIN: 7.2 g/dL (ref 6.0–8.3)
Total Bilirubin: 0.5 mg/dL (ref 0.2–1.2)

## 2015-07-09 LAB — BASIC METABOLIC PANEL
BUN: 12 mg/dL (ref 6–23)
CHLORIDE: 106 meq/L (ref 96–112)
CO2: 26 meq/L (ref 19–32)
CREATININE: 1.33 mg/dL (ref 0.40–1.50)
Calcium: 9.6 mg/dL (ref 8.4–10.5)
GFR: 66.02 mL/min (ref >60.00)
Glucose, Bld: 174 mg/dL — ABNORMAL HIGH (ref 70–99)
POTASSIUM: 4.7 meq/L (ref 3.5–5.1)
Sodium: 140 mEq/L (ref 135–145)

## 2015-07-09 LAB — CBC WITH DIFFERENTIAL/PLATELET
BASOS PCT: 0.4 % (ref 0.0–3.0)
Basophils Absolute: 0 10*3/uL (ref 0.0–0.1)
EOS ABS: 0.3 10*3/uL (ref 0.0–0.7)
EOS PCT: 3.5 % (ref 0.0–5.0)
HCT: 36.7 % — ABNORMAL LOW (ref 39.0–52.0)
HEMOGLOBIN: 12.4 g/dL — AB (ref 13.0–17.0)
LYMPHS ABS: 1.7 10*3/uL (ref 0.7–4.0)
Lymphocytes Relative: 20 % (ref 12.0–46.0)
MCHC: 33.7 g/dL (ref 30.0–36.0)
MCV: 87.1 fl (ref 78.0–100.0)
MONO ABS: 0.5 10*3/uL (ref 0.1–1.0)
Monocytes Relative: 6.3 % (ref 3.0–12.0)
NEUTROS PCT: 69.8 % (ref 43.0–77.0)
Neutro Abs: 5.8 10*3/uL (ref 1.4–7.7)
Platelets: 241 10*3/uL (ref 150.0–400.0)
RBC: 4.22 Mil/uL (ref 4.22–5.81)
RDW: 14.1 % (ref 11.5–15.5)
WBC: 8.3 10*3/uL (ref 4.0–10.5)

## 2015-07-09 LAB — LIPID PANEL
Cholesterol: 121 mg/dL (ref 0–200)
HDL: 38.9 mg/dL — AB (ref >39.00)
LDL Cholesterol: 68 mg/dL (ref 0–99)
NonHDL: 82.57
TRIGLYCERIDES: 71 mg/dL (ref 0.0–149.0)
Total CHOL/HDL Ratio: 3
VLDL: 14.2 mg/dL (ref 0.0–40.0)

## 2015-07-09 LAB — TSH: TSH: 1.99 u[IU]/mL (ref 0.35–4.50)

## 2015-07-09 LAB — PSA, MEDICARE: PSA: 5.63 ng/mL — AB (ref 0.10–4.00)

## 2015-07-09 MED ORDER — NIFEDIPINE ER 30 MG PO TB24: 30.0000 mg | ORAL_TABLET | Freq: Every day | ORAL | Status: DC

## 2015-07-09 MED ORDER — HYDRALAZINE HCL 25 MG PO TABS: 25.0000 mg | ORAL_TABLET | Freq: Three times a day (TID) | ORAL | Status: DC

## 2015-07-09 MED ORDER — QUETIAPINE FUMARATE 25 MG PO TABS: 12.5000 mg | ORAL_TABLET | Freq: Every day | ORAL | Status: DC

## 2015-07-09 NOTE — Progress Notes (Signed)
Pre visit review using our clinic review tool, if applicable. No additional management support is needed unless otherwise documented below in the visit note. 

## 2015-07-09 NOTE — Patient Instructions (Addendum)
Follow up in 3 months to recheck blood pressure We'll notify you of your lab results and make any changes if needed You are up to date on your foot and eye exams as well as your pneumonia vaccinations- yay! Continue to work on [employer] with any questions or concerns Hang in there!!! Happy Spring!

## 2015-07-09 NOTE — Progress Notes (Signed)
   Subjective:    Patient ID: Marcus Weaver, male    DOB: [birth year], 82 y.o.   MRN: [id number]  HPI Here today for CPE.  Risk Factors: HTN- chronic problem, not well controlled on Hydralazine, Lisinopril, Metoprolol, Nifedipine Hyperlipidemia- chronic problem, on Lipitor daily DM- chronic problem, following w/ Dr Loanne Drilling. Dementia- ongoing issue for pt.  On Seroquel Physical Activity: limited due to increased falls and dementia Fall Risk: elevated- pt has hx of wandering, uses a walker Depression: denies Hearing: normal to conversational tones, decreased to whispered voice ADL's: able to feed and dress but needs assistance w/ medications, bathing, household chores Cognitive: pt having obvious memory issues but this waxes and wanes- known dementia. Home Safety: living w/ alone- family is aware of safety issues Height, Weight, BMI, Visual Acuity: see vitals, vision corrected to 20/20 w/ glasses Counseling: UTD on foot exam, eye exam.  No need for colonoscopy at this time.  UTD on Prevnar and Pneumovax Care team reviewed and updated w/ pt Labs Ordered: See A&P Care Plan: See A&P    Review of Systems Patient reports no vision/hearing changes, anorexia, fever ,adenopathy, persistant/recurrent hoarseness, swallowing issues, chest pain, palpitations, edema, persistant/recurrent cough, hemoptysis, dyspnea (rest,exertional, paroxysmal nocturnal), gastrointestinal  bleeding (melena, rectal bleeding), abdominal pain, excessive heart burn, GU symptoms (dysuria, hematuria, voiding/incontinence issues) syncope, numbness & tingling, skin/hair/nail changes, depression, anxiety, abnormal bruising/bleeding, musculoskeletal symptoms/signs.   + weakness of L hand due to current fracture    Objective:   Physical Exam General Appearance:    Alert, cooperative, no distress, appears stated age, smells of urine  Head:    Normocephalic, without obvious abnormality, atraumatic  Eyes:    PERRL, conjunctiva  clear, obvious cataract on R, EOM's intact, fundi benign, both eyes       Ears:    Normal TM's and external ear canals, both ears  Nose:   Nares normal, septum midline, mucosa normal, no drainage   or sinus tenderness  Throat:   Lips, mucosa, and tongue normal; teeth and gums normal  Neck:   Supple, symmetrical, trachea midline, no adenopathy;       thyroid:  No enlargement/tenderness/nodules  Back:     Symmetric, no curvature, ROM normal, no CVA tenderness  Lungs:     Clear to auscultation bilaterally, respirations unlabored  Chest wall:    No tenderness or deformity  Heart:    Regular rate and rhythm, S1 and S2 normal, no murmur, rub   or gallop  Abdomen:     Soft, non-tender, bowel sounds active all four quadrants,    no masses, no organomegaly  Genitalia:    deferred  Rectal:    Extremities:   Extremities normal, atraumatic, no cyanosis or edema  Pulses:   2+ and symmetric all extremities  Skin:   Skin color, texture, turgor normal, no rashes or lesions  Lymph nodes:   Cervical, supraclavicular, and axillary nodes normal  Neurologic:   CNII-XII intact.          Assessment & Plan:

## 2015-07-10 ENCOUNTER — Telehealth: Payer: Self-pay | Admitting: Family Medicine

## 2015-07-10 ENCOUNTER — Other Ambulatory Visit: Payer: Self-pay | Admitting: Family Medicine

## 2015-07-10 DIAGNOSIS — R972 Elevated prostate specific antigen [PSA]: Secondary | ICD-10-CM

## 2015-07-10 NOTE — Telephone Encounter (Signed)
Caller with [facility] asking for verbal order to see pt 1 x wk for 4 wks to work more with his hand.

## 2015-07-10 NOTE — Telephone Encounter (Signed)
Verbal ok given to continue therapy.

## 2015-07-11 ENCOUNTER — Ambulatory Visit (INDEPENDENT_AMBULATORY_CARE_PROVIDER_SITE_OTHER): Payer: Commercial Managed Care - HMO | Admitting: Endocrinology

## 2015-07-11 VITALS — BP 148/80 | HR 132 | Temp 98.2°F | Ht 68.0 in

## 2015-07-11 DIAGNOSIS — E1136 Type 2 diabetes mellitus with diabetic cataract: Secondary | ICD-10-CM

## 2015-07-11 DIAGNOSIS — R000 Tachycardia, unspecified: Secondary | ICD-10-CM

## 2015-07-11 DIAGNOSIS — Z794 Long term (current) use of insulin: Secondary | ICD-10-CM | POA: Diagnosis not present

## 2015-07-11 DIAGNOSIS — E1122 Type 2 diabetes mellitus with diabetic chronic kidney disease: Secondary | ICD-10-CM

## 2015-07-11 DIAGNOSIS — N183 Chronic kidney disease, stage 3 (moderate): Secondary | ICD-10-CM

## 2015-07-11 MED ORDER — INSULIN GLARGINE 100 UNIT/ML SOLOSTAR PEN: 70.0000 [IU] | PEN_INJECTOR | SUBCUTANEOUS | Status: DC

## 2015-07-11 NOTE — Progress Notes (Signed)
Subjective:    Patient ID: Marcus Weaver, male    DOB: 13-Nov-1930, 82 y.o.   MRN: [id number]  HPI Pt returns for f/u of diabetes mellitus: DM type: Insulin-requiring type 2 Dx'ed: 2025 Complications: renal insufficiency and neuropathy.  Therapy: insulin since 2014 (and metformin).   DKA: never.   Severe hypoglycemia: once (2011).   Pancreatitis: never.   Other: he was changed to QD insulin, after poor results with bid premixed insulin, and also with multiple daily injections.   Interval history: He seldom checks cbg's, but he says he never misses the insulin.  He says when he checks, it is in the mid-100's. He takes 60 units qam.  pt states he feels well in general. Past Medical History  Diagnosis Date  . Hypertension   . Diabetes mellitus     type2  . Hyperlipidemia   . UTI (lower urinary tract infection)   . CKD (chronic kidney disease)     Past Surgical History  Procedure Laterality Date  . Cataract extraction      Social History   Social History  . Marital Status: Single    Spouse Name: N/A  . Number of Children: N/A  . Years of Education: N/A   Occupational History  . retired    Social History Main Topics  . Smoking status: Former Smoker    Types: Cigarettes    Quit date: 06/25/2015  . Smokeless tobacco: Never Used     Comment: smokes 3 or 4 cigarettes /day  . Alcohol Use: Yes     Comment: occassional  . Drug Use: No  . Sexual Activity: Not on file   Other Topics Concern  . Not on file   Social History Narrative    Current Outpatient Prescriptions on File Prior to Visit  Medication Sig Dispense Refill  . Alcohol Swabs PADS Use daily for glucose testing. Dx. 250.00 120 each 3  . aspirin EC 81 MG tablet Take 81 mg by mouth daily.    Marland Kitchen atorvastatin (LIPITOR) 10 MG tablet Take 10 mg by mouth daily.     . Blood Glucose Monitoring Suppl (ACCU-CHEK AVIVA PLUS) w/Device KIT Use to check blood sugar 2 times per day. Dx code e11.9 1 kit 1  . glucose blood  (ACCU-CHEK AVIVA PLUS) test strip Use to check blood sugar 2 times per day. Dx code E11.9 100 each 2  . hydrALAZINE (APRESOLINE) 25 MG tablet Take 1 tablet (25 mg total) by mouth 3 (three) times daily. 90 tablet 6  . Insulin Pen Needle (B-D ULTRAFINE III SHORT PEN) 31G X 8 MM MISC USE AS DIRECTED 100 each 1  . Lancets (ACCU-CHEK MULTICLIX) lancets Use to check blood sugar 2 times per day. Dx code E11.9 100 each 2  . lisinopril (PRINIVIL,ZESTRIL) 20 MG tablet Take 1 tablet (20 mg total) by mouth daily. 30 tablet 6  . metoprolol tartrate (LOPRESSOR) 25 MG tablet Take 25 mg by mouth daily.     Marland Kitchen NIFEdipine (PROCARDIA-XL/ADALAT CC) 30 MG 24 hr tablet Take 1 tablet (30 mg total) by mouth daily. 30 tablet 6  . QUEtiapine (SEROQUEL) 25 MG tablet Take 0.5 tablets (12.5 mg total) by mouth at bedtime. 30 tablet 6  . traMADol (ULTRAM) 50 MG tablet Take 1 tablet (50 mg total) by mouth every 6 (six) hours as needed for moderate pain. 30 tablet 0   No current facility-administered medications on file prior to visit.    No Known Allergies  Family History  Problem Relation Age of Onset  . Cancer Neg Hx   . Diabetes Other     1st degree relative  . Hypertension Other     BP 148/80 mmHg  Pulse 132  Temp(Src) 98.2 F (36.8 C) (Oral)  Ht '5\' 8"'$  (1.727 m)  SpO2 97%  Review of Systems He denies hypoglycemia    Objective:   Physical Exam VITAL SIGNS:  See vs page GENERAL: no distress Pulses: dorsalis pedis intact bilat.   MSK: no deformity of the feet CV: no leg edema Skin:  no ulcer on the feet.  normal color and temp on the feet. Neuro: sensation is intact to touch on the feet.   Ext: There is bilateral onychomycosis of the toenails.   Lab Results  Component Value Date   HGBA1C 8.7* 05/15/2015      Assessment & Plan:  DM: he needs increased rx Noncompliance with cbg recording, persistent Renal insuff: he should d/c metformin.   Tachycardia: recurrent.   Patient is advised the  following: Patient Instructions  Please increase the lantus to 70 units each morning.   Please stop taking the metformin.   check your blood sugar twice a day.  vary the time of day when you check, between before the 3 meals, and at bedtime.  also check if you have symptoms of your blood sugar being too high or too low.  please keep a record of the readings and bring it to your next appointment here.  You can write it on any piece of paper.  please call us sooner if your blood sugar goes below 70, or if you have a lot of readings over 200.   Please see the heart specialist, to see why your heart is racing.   Please come back for a follow-up appointment in 3 months.

## 2015-07-11 NOTE — Patient Instructions (Addendum)
Please increase the lantus to 70 units each morning.   Please stop taking the metformin.   check your blood sugar twice a day.  vary the time of day when you check, between before the 3 meals, and at bedtime.  also check if you have symptoms of your blood sugar being too high or too low.  please keep a record of the readings and bring it to your next appointment here.  You can write it on any piece of paper.  please call us sooner if your blood sugar goes below 70, or if you have a lot of readings over 200.   Please see the heart specialist, to see why your heart is racing.   Please come back for a follow-up appointment in 3 months.

## 2015-07-12 DIAGNOSIS — E119 Type 2 diabetes mellitus without complications: Secondary | ICD-10-CM | POA: Insufficient documentation

## 2015-07-14 NOTE — Assessment & Plan Note (Signed)
Chronic problem.  On lipitor w/o difficulty.  Check labs.  Adjust meds prn

## 2015-07-14 NOTE — Assessment & Plan Note (Signed)
Pt's PE unchanged from previous.  No need for colonoscopy at this time.  UTD on immunizations.  Written screening schedule updated and given to pt.  Care team reviewed and updated w/ pt.  Check labs.  Anticipatory guidance provided.

## 2015-07-14 NOTE — Assessment & Plan Note (Signed)
Ongoing issue for pt.  Family was not present for exam today and pt smells of urine.  Pt is able to converse normally but has difficulty providing any history.  Remains on Seroquel nightly.  Will continue to follow w/ pt and family.

## 2015-07-14 NOTE — Assessment & Plan Note (Signed)
Ongoing issue for pt.  Just recently restarted Lisinopril.  No med changes at this time but will follow closely and work with his specialists to safely improve BP control.

## 2015-07-16 ENCOUNTER — Telehealth: Payer: Self-pay | Admitting: General Practice

## 2015-07-16 DIAGNOSIS — R000 Tachycardia, unspecified: Secondary | ICD-10-CM

## 2015-07-16 NOTE — Telephone Encounter (Signed)
Referral was placed for cardiology today, pt to see Dr. Dorris Carnes

## 2015-07-21 ENCOUNTER — Ambulatory Visit: Payer: Commercial Managed Care - HMO | Admitting: Internal Medicine

## 2015-07-23 ENCOUNTER — Other Ambulatory Visit: Payer: Self-pay | Admitting: General Practice

## 2015-07-23 ENCOUNTER — Telehealth: Payer: Self-pay | Admitting: Family Medicine

## 2015-07-23 MED ORDER — NIFEDIPINE ER OSMOTIC RELEASE 60 MG PO TB24: 60.0000 mg | ORAL_TABLET | Freq: Every day | ORAL | Status: DC

## 2015-07-23 NOTE — Telephone Encounter (Signed)
Increase Procardia to 60mg  daily and he will need OV in 1-2 weeks

## 2015-07-23 NOTE — Telephone Encounter (Signed)
Patient daughter notified of PCP recommendations and is agreement and expresses an understanding. New procardia rx sent to local pharmacy as requested. Also LMOVM to inform Kasey with [facility] on the changes. Pt daughter will call back to schedule follow up appt.

## 2015-07-23 NOTE — Telephone Encounter (Signed)
Caller with [facility] states that pt BP is 190/110 she states she waited and took it again 190/100. Pt states that he is taking his BP meds.

## 2015-07-25 ENCOUNTER — Telehealth: Payer: Self-pay | Admitting: Family Medicine

## 2015-07-25 NOTE — Telephone Encounter (Signed)
Called patient daughter and LMOVM to return call

## 2015-07-25 NOTE — Telephone Encounter (Signed)
If his blood pressure is good later in the day, it is b/c he's taking his morning meds.  If it's high in the morning, it's likely b/c he's missing his hydralazine dose at night.  Please have them verify w/ family that he is taking this

## 2015-07-25 NOTE — Telephone Encounter (Signed)
Caller with [facility] states that pt BP is still running high in the mornings rt arm 180/90 lt arm 190/100. By the afternoon pt BP is good. [facility] is trying to have pt take meds earlier and working with family on this. Also caller states that if you call pt to call Tomi Bamberger [phone] b/c pt will not answer phone.

## 2015-07-25 NOTE — Telephone Encounter (Signed)
Called patient and LMOVM to return call.     

## 2015-07-29 ENCOUNTER — Encounter: Payer: Self-pay | Admitting: General Practice

## 2015-07-29 NOTE — Telephone Encounter (Signed)
Cannot reach family by phone, letter mailed.

## 2015-08-14 ENCOUNTER — Encounter: Payer: Self-pay | Admitting: Cardiology

## 2015-08-14 ENCOUNTER — Ambulatory Visit (INDEPENDENT_AMBULATORY_CARE_PROVIDER_SITE_OTHER): Payer: Commercial Managed Care - HMO | Admitting: Cardiology

## 2015-08-14 DIAGNOSIS — R000 Tachycardia, unspecified: Secondary | ICD-10-CM | POA: Diagnosis not present

## 2015-08-14 DIAGNOSIS — I119 Hypertensive heart disease without heart failure: Secondary | ICD-10-CM | POA: Insufficient documentation

## 2015-08-14 DIAGNOSIS — I10 Essential (primary) hypertension: Secondary | ICD-10-CM | POA: Diagnosis not present

## 2015-08-14 DIAGNOSIS — R9431 Abnormal electrocardiogram [ECG] [EKG]: Secondary | ICD-10-CM

## 2015-08-14 HISTORY — DX: Abnormal electrocardiogram (ECG) (EKG): R94.31

## 2015-08-14 HISTORY — DX: Hypertensive heart disease without heart failure: I11.9

## 2015-08-14 MED ORDER — CARVEDILOL 6.25 MG PO TABS: 6.2500 mg | ORAL_TABLET | Freq: Two times a day (BID) | ORAL | Status: DC

## 2015-08-14 NOTE — Patient Instructions (Addendum)
Medication Instructions:  1) STOP METOPROLOL 2) START COREG 6.25 mg TWICE DAILY  Labwork: None  Testing/Procedures: Your physician has requested that you have an echocardiogram. Echocardiography is a painless test that uses sound waves to create images of your heart. It provides your doctor with information about the size and shape of your heart and how well your heart's chambers and valves are working. This procedure takes approximately one hour. There are no restrictions for this procedure.  Follow-Up: Your physician recommends that you schedule a follow-up appointment in 1 WEEK in the [facility] Clinic. Please bring your medications for verification at this appointment.  Your physician recommends that you schedule a follow-up appointment in 2 months with a PA or NP.  Any Other Special Instructions Will Be Listed Below (If Applicable).     If you need a refill on your cardiac medications before your next appointment, please call your pharmacy.

## 2015-08-14 NOTE — Progress Notes (Signed)
Cardiology Office Note    Date:  08/14/2015   ID:  Jagdeep Ancheta, DOB 1931-03-03, MRN [id number]  PCP:  Annye Asa, MD  Cardiologist:  Fransico Him, MD   Chief Complaint  Patient presents with  . Hypertension  . Irregular Heart Beat    History of Present Illness:  Marcus Weaver is a 82 y.o. male with a history of HTN, type 2 DM and dyslipidemia who is referred today for evaluation of palpitations.  At last OV 06/2015 with Endocrinology his heart rate was noted to be 132bpm but no EKG was done.  He is now here to evaluate further.  He denies any history of palpitations before.  He denies any chest pain or pressure, SOB, DOE, PND or orthopnea, LE edema, dizziness or syncope.     Past Medical History  Diagnosis Date  . Hypertension   . Diabetes mellitus     type2  . Hyperlipidemia   . UTI (lower urinary tract infection)   . CKD (chronic kidney disease)   . Hypertensive heart disease 08/14/2015  . Abnormal EKG 08/14/2015    Past Surgical History  Procedure Laterality Date  . Cataract extraction      Current Medications: Outpatient Prescriptions Prior to Visit  Medication Sig Dispense Refill  . Alcohol Swabs PADS Use daily for glucose testing. Dx. 250.00 120 each 3  . aspirin EC 81 MG tablet Take 81 mg by mouth daily.    Marland Kitchen atorvastatin (LIPITOR) 10 MG tablet Take 10 mg by mouth daily.     . Blood Glucose Monitoring Suppl (ACCU-CHEK AVIVA PLUS) w/Device KIT Use to check blood sugar 2 times per day. Dx code e11.9 1 kit 1  . glucose blood (ACCU-CHEK AVIVA PLUS) test strip Use to check blood sugar 2 times per day. Dx code E11.9 100 each 2  . hydrALAZINE (APRESOLINE) 25 MG tablet Take 1 tablet (25 mg total) by mouth 3 (three) times daily. 90 tablet 6  . Insulin Glargine (LANTUS SOLOSTAR) 100 UNIT/ML Solostar Pen Inject 70 Units into the skin every morning. 90 mL 3  . Insulin Pen Needle (B-D ULTRAFINE III SHORT PEN) 31G X 8 MM MISC USE AS DIRECTED 100 each 1  . Lancets  (ACCU-CHEK MULTICLIX) lancets Use to check blood sugar 2 times per day. Dx code E11.9 100 each 2  . lisinopril (PRINIVIL,ZESTRIL) 20 MG tablet Take 1 tablet (20 mg total) by mouth daily. 30 tablet 6  . metoprolol tartrate (LOPRESSOR) 25 MG tablet Take 25 mg by mouth daily.     Marland Kitchen NIFEdipine (PROCARDIA XL/ADALAT-CC) 60 MG 24 hr tablet Take 1 tablet (60 mg total) by mouth daily. 30 tablet 6  . QUEtiapine (SEROQUEL) 25 MG tablet Take 0.5 tablets (12.5 mg total) by mouth at bedtime. 30 tablet 6  . traMADol (ULTRAM) 50 MG tablet Take 1 tablet (50 mg total) by mouth every 6 (six) hours as needed for moderate pain. 30 tablet 0   No facility-administered medications prior to visit.     Allergies:   Review of patient's allergies indicates no known allergies.   Social History   Social History  . Marital Status: Single    Spouse Name: N/A  . Number of Children: N/A  . Years of Education: N/A   Occupational History  . retired    Social History Main Topics  . Smoking status: Former Smoker    Types: Cigarettes    Quit date: 06/25/2015  . Smokeless tobacco: Never Used  Comment: smokes 3 or 4 cigarettes /day  . Alcohol Use: Yes     Comment: occassional  . Drug Use: No  . Sexual Activity: Not Asked   Other Topics Concern  . None   Social History Narrative     Family History:  The patient's family history includes Diabetes in his other; Hypertension in his other. There is no history of Cancer.   ROS:   Please see the history of present illness.    ROS All other systems reviewed and are negative.   PHYSICAL EXAM:   VS:  BP 170/90 mmHg  Pulse 81  Ht _0  (1.727 m)  Wt 183 lb 6.4 oz (83.19 kg)  BMI 27.89 kg/m2   GEN: Well nourished, well developed, in no acute distress HEENT: normal Neck: no JVD, carotid bruits, or masses Cardiac: RRR; no murmurs, rubs, or gallops,no edema.  Intact distal pulses bilaterally.  Respiratory:  clear to auscultation bilaterally, normal work of  breathing GI: soft, nontender, nondistended, + BS MS: no deformity or atrophy Skin: warm and dry, no rash Neuro:  Alert and Oriented x 3, Strength and sensation are intact Psych: euthymic mood, full affect  Wt Readings from Last 3 Encounters:  08/14/15 183 lb 6.4 oz (83.19 kg)  07/09/15 187 lb 6 oz (84.993 kg)  06/19/15 190 lb (86.183 kg)      Studies/Labs Reviewed:   EKG:  EKG is  ordered today.  The ekg ordered today demonstrates NSR at 81bpm with LVH by voltage and no ST changes  Recent Labs: 05/16/2015: Magnesium 1.7 07/09/2015: ALT 9; BUN 12; Creatinine, Ser 1.33; Hemoglobin 12.4*; Platelets 241.0; Potassium 4.7; Sodium 140; TSH 1.99   Lipid Panel    Component Value Date/Time   CHOL 121 07/09/2015 1100   TRIG 71.0 07/09/2015 1100   HDL 38.90* 07/09/2015 1100   CHOLHDL 3 07/09/2015 1100   VLDL 14.2 07/09/2015 1100   LDLCALC 68 07/09/2015 1100    Additional studies/ records that were reviewed today include:  OV notes from PCP    ASSESSMENT:    1. Uncontrolled hypertension   2. Tachycardia   3. Hypertensive heart disease without heart failure      PLAN:  In order of problems listed above:  1. HTN - poorly controlled.  He is on multiple antihypertensive medications at low doses and I would like to consolidate these.  He was only taking metoprolol tartrate once daily instead of BID and then did not refill it.  I am going to start Coreg 6.17m BID.  He will be seen back in HTN clinic in 1 week for further uptitration of meds.  If his BP is still elevated would increase Hydralazine to 546mTID.  The plan would be to increase BB as HR allows and Hydralazine and discontinue Procardia.  I would avoid further uptitration of ACE I due to underlying CKD.  I suspect there is a component of medical noncompliance.  2. Tachycardia - heart rate is normal today and he has no history of Palpitations.  Will get a 30 day heart monitor to assess for silent PAF. 3. Abnormal EKG with LVH  c/w hypertensive heart disease.  Will get 2D echo to assess LVF and LVH.    Medication Adjustments/Labs and Tests Ordered: Current medicines are reviewed at length with the patient today.  Concerns regarding medicines are outlined above.  Medication changes, Labs and Tests ordered today are listed in the Patient Instructions below.  There are no Patient Instructions  on file for this visit.   Signed, Fransico Him, MD  08/14/2015 2:37 PM    [address], [place], [place]  [address] Phone: [phone]; Fax: [phone]

## 2015-08-19 ENCOUNTER — Telehealth: Payer: Self-pay | Admitting: General Practice

## 2015-08-19 NOTE — Telephone Encounter (Signed)
Paperwork received to advise that HHA was sick and appt would be rescheduled. Papers signed by PCP and faxed on 08/11/15

## 2015-08-21 ENCOUNTER — Telehealth: Payer: Self-pay | Admitting: General Practice

## 2015-08-21 NOTE — Telephone Encounter (Signed)
HH orders received from [facility] for [facility] to assist pt with ADL's twice weekly. Papers signed and faxed on 08/21/15.

## 2015-08-22 ENCOUNTER — Ambulatory Visit (INDEPENDENT_AMBULATORY_CARE_PROVIDER_SITE_OTHER): Payer: Commercial Managed Care - HMO | Admitting: Pharmacist

## 2015-08-22 VITALS — BP 180/90 | HR 60

## 2015-08-22 DIAGNOSIS — I119 Hypertensive heart disease without heart failure: Secondary | ICD-10-CM | POA: Diagnosis not present

## 2015-08-22 MED ORDER — AMLODIPINE BESYLATE 5 MG PO TABS: 5.0000 mg | ORAL_TABLET | Freq: Every day | ORAL | Status: DC

## 2015-08-22 NOTE — Progress Notes (Signed)
Patient ID: Marcus Weaver                 DOB: February 17, 1931                      MRN: [id number]     HPI: Marcus Weaver is a 82 y.o. male referred by Dr. Radford Pax to HTN clinic.  Dr. Radford Pax saw pt on 08/14/15 and BP was quite elevated.  He has a PMH significant for HTN, DM, dyslipidemia and palpitations.  Dr. Theodosia Blender plans were to try to consolidate pt's BP meds.  She changed metoprolol to carvediolol for better BP control.  Pt did bring all of his medications in today.  He states his daughter puts his medications out for him each day to take.  I reviewed the dates on his bottles and the number of pills remaining. All of his medications were filled in April and were almost completely full bottles.  He has several twice a day medications and also hydralazine that is three times a day.  Pt states he is able to take medications three times a day but based on his bottles he has not be very compliant with any medication.   He denies any swelling, dizziness, blurred vision or headache with elevated BP  Current HTN meds: carvedilol 6.20m BID, hydralazine 259mTID, lisinopril 2010maily  BP goal: <140/90 given age and DM  Diet: Pt does not add any salt to food or drink caffeinated beverages.    Home BP readings:  Pt did not bring any cuff or home readings with him.   Wt Readings from Last 3 Encounters:  08/14/15 183 lb 6.4 oz (83.19 kg)  07/09/15 187 lb 6 oz (84.993 kg)  06/19/15 190 lb (86.183 kg)   BP Readings from Last 3 Encounters:  08/14/15 170/90  07/11/15 148/80  07/09/15 152/68   Pulse Readings from Last 3 Encounters:  08/14/15 81  07/11/15 132  07/09/15 85    Renal function: CrCl cannot be calculated (Patient has no serum creatinine result on file.).  Past Medical History  Diagnosis Date  . Hypertension   . Diabetes mellitus     type2  . Hyperlipidemia   . UTI (lower urinary tract infection)   . CKD (chronic kidney disease)   . Hypertensive heart disease 08/14/2015  . Abnormal  EKG 08/14/2015    Current Outpatient Prescriptions on File Prior to Visit  Medication Sig Dispense Refill  . Alcohol Swabs PADS Use daily for glucose testing. Dx. 250.00 120 each 3  . aspirin EC 81 MG tablet Take 81 mg by mouth daily.    . aMarland Kitchenorvastatin (LIPITOR) 10 MG tablet Take 10 mg by mouth daily.     . Blood Glucose Monitoring Suppl (ACCU-CHEK AVIVA PLUS) w/Device KIT Use to check blood sugar 2 times per day. Dx code e11.9 1 kit 1  . carvedilol (COREG) 6.25 MG tablet Take 1 tablet (6.25 mg total) by mouth 2 (two) times daily. 60 tablet 11  . glucose blood (ACCU-CHEK AVIVA PLUS) test strip Use to check blood sugar 2 times per day. Dx code E11.9 100 each 2  . hydrALAZINE (APRESOLINE) 25 MG tablet Take 1 tablet (25 mg total) by mouth 3 (three) times daily. 90 tablet 6  . Insulin Glargine (LANTUS SOLOSTAR) 100 UNIT/ML Solostar Pen Inject 70 Units into the skin every morning. 90 mL 3  . Insulin Pen Needle (B-D ULTRAFINE III SHORT PEN) 31G X 8 MM MISC USE AS  DIRECTED 100 each 1  . Lancets (ACCU-CHEK MULTICLIX) lancets Use to check blood sugar 2 times per day. Dx code E11.9 100 each 2  . lisinopril (PRINIVIL,ZESTRIL) 20 MG tablet Take 1 tablet (20 mg total) by mouth daily. 30 tablet 6  . NIFEdipine (PROCARDIA XL/ADALAT-CC) 60 MG 24 hr tablet Take 1 tablet (60 mg total) by mouth daily. 30 tablet 6  . QUEtiapine (SEROQUEL) 25 MG tablet Take 0.5 tablets (12.5 mg total) by mouth at bedtime. 30 tablet 6  . traMADol (ULTRAM) 50 MG tablet Take 1 tablet (50 mg total) by mouth every 6 (six) hours as needed for moderate pain. 30 tablet 0   No current facility-administered medications on file prior to visit.    No Known Allergies   Assessment/Plan:  1.  Hypertension- I believe most of his HTN related issues are due to medication non-compliance.  He would benefit from a more simple regimen with as many once a day medications as we can do.  Will start by d/c hydralazine since it is 3 times a day and  change to amlodipine once daily.  Instructed pt to bring his cuff and BP readings from home next week.   He has an echocardiogram in 1 week.  Will recheck BP at that time as well as compliance with medications.

## 2015-08-22 NOTE — Patient Instructions (Signed)
Stop Hydralazine.  Start amlodipine 5mg  once daily.    Continue to check your blood pressure at home.  Next time, please bring your blood pressure readings and your blood pressure cuff.   Follow up with Gay Filler in 2 weeks. [phone])

## 2015-08-29 ENCOUNTER — Other Ambulatory Visit: Payer: Self-pay

## 2015-08-29 ENCOUNTER — Ambulatory Visit (INDEPENDENT_AMBULATORY_CARE_PROVIDER_SITE_OTHER): Payer: Commercial Managed Care - HMO | Admitting: Pharmacist

## 2015-08-29 ENCOUNTER — Ambulatory Visit (HOSPITAL_COMMUNITY): Payer: Commercial Managed Care - HMO | Attending: Cardiology

## 2015-08-29 VITALS — BP 158/72 | HR 72

## 2015-08-29 DIAGNOSIS — I1310 Hypertensive heart and chronic kidney disease without heart failure, with stage 1 through stage 4 chronic kidney disease, or unspecified chronic kidney disease: Secondary | ICD-10-CM | POA: Diagnosis not present

## 2015-08-29 DIAGNOSIS — I10 Essential (primary) hypertension: Secondary | ICD-10-CM | POA: Diagnosis not present

## 2015-08-29 DIAGNOSIS — E1122 Type 2 diabetes mellitus with diabetic chronic kidney disease: Secondary | ICD-10-CM | POA: Diagnosis not present

## 2015-08-29 DIAGNOSIS — Z720 Tobacco use: Secondary | ICD-10-CM | POA: Diagnosis not present

## 2015-08-29 DIAGNOSIS — I358 Other nonrheumatic aortic valve disorders: Secondary | ICD-10-CM | POA: Insufficient documentation

## 2015-08-29 DIAGNOSIS — E785 Hyperlipidemia, unspecified: Secondary | ICD-10-CM | POA: Diagnosis not present

## 2015-08-29 DIAGNOSIS — N183 Chronic kidney disease, stage 3 (moderate): Secondary | ICD-10-CM | POA: Insufficient documentation

## 2015-08-29 DIAGNOSIS — I119 Hypertensive heart disease without heart failure: Secondary | ICD-10-CM

## 2015-08-29 DIAGNOSIS — R000 Tachycardia, unspecified: Secondary | ICD-10-CM | POA: Insufficient documentation

## 2015-08-29 LAB — ECHOCARDIOGRAM COMPLETE
AVLVOTPG: 5 mmHg
E decel time: 282 msec
EERAT: 13.12
FS: 30 % (ref 28–44)
IVS/LV PW RATIO, ED: 0.65
LA diam end sys: 36 mm
LA diam index: 1.79 cm/m2
LA vol A4C: 37.2 ml
LA vol: 53.8 mL
LASIZE: 36 mm
LAVOLIN: 26.7 mL/m2
LDCA: 3.14 cm2
LV E/e'average: 13.12
LV PW d: 12.1 mm — AB (ref 0.6–1.1)
LV TDI E'LATERAL: 7.62
LV TDI E'MEDIAL: 6.09
LV e' LATERAL: 7.62 cm/s
LVEEMED: 13.12
LVOT VTI: 26.5 cm
LVOT diameter: 20 mm
LVOT peak vel: 112 cm/s
LVOTSV: 83 mL
MV Dec: 282
MV pk A vel: 110 m/s
MV pk E vel: 100 m/s
MVPG: 4 mmHg
RV LATERAL S' VELOCITY: 12.4 cm/s

## 2015-08-29 NOTE — Patient Instructions (Signed)
Your blood pressure looks better but it is still a little higher than what we like to see.    Keep you medications the same.   We will see you again in 2 weeks to check your blood pressure.  Please bring your blood pressure cuff from home with you.

## 2015-08-29 NOTE — Progress Notes (Signed)
Patient ID: Marcus Weaver                 DOB: 07/22/1930                      MRN: [id number]     HPI: Marcus Weaver is a 82 y.o. male referred by Dr. Radford Pax to HTN clinic.  Dr. Radford Pax saw pt on 08/14/15 and BP was quite elevated.  He has a PMH significant for HTN, DM, dyslipidemia and palpitations.  Dr. Theodosia Blender plans were to try to consolidate pt's BP meds.  She changed metoprolol to carvediolol for better BP control.  Pt did bring all of his medications in last week.  He states his daughter puts his medications out for him each day to take.  I reviewed the dates on his bottles and the number of pills remaining. All of his medications were filled in April and were almost completely full bottles.  He has several twice a day medications and also hydralazine that is three times a day.  Pt states he is able to take medications three times a day but based on his bottles he has not be very compliant with any medication.   Pt states he was able to get the amlodipine from the pharmacy.  He has not noticed any problems since starting this.  He reports compliance but did not bring his medication bottles for review today. He denies any swelling, dizziness, blurred vision or headache with elevated BP.    Current HTN meds: carvedilol 6.62m BID,amlodipine 564mdaily, lisinopril 2022maily  BP goal: <140/90 given age and DM  Diet: Pt does not add any salt to food or drink caffeinated beverages.   Home BP readings:  Pt did not bring any cuff or home readings with him.   Wt Readings from Last 3 Encounters:  08/14/15 183 lb 6.4 oz (83.19 kg)  07/09/15 187 lb 6 oz (84.993 kg)  06/19/15 190 lb (86.183 kg)   BP Readings from Last 3 Encounters:  08/22/15 180/90  08/14/15 170/90  07/11/15 148/80   Pulse Readings from Last 3 Encounters:  08/22/15 60  08/14/15 81  07/11/15 132    Renal function: CrCl cannot be calculated (Unknown ideal weight.).  Past Medical History  Diagnosis Date  . Hypertension   .  Diabetes mellitus     type2  . Hyperlipidemia   . UTI (lower urinary tract infection)   . CKD (chronic kidney disease)   . Hypertensive heart disease 08/14/2015  . Abnormal EKG 08/14/2015    Current Outpatient Prescriptions on File Prior to Visit  Medication Sig Dispense Refill  . Alcohol Swabs PADS Use daily for glucose testing. Dx. 250.00 120 each 3  . amLODipine (NORVASC) 5 MG tablet Take 1 tablet (5 mg total) by mouth daily. 30 tablet 6  . aspirin EC 81 MG tablet Take 81 mg by mouth daily.    . Blood Glucose Monitoring Suppl (ACCU-CHEK AVIVA PLUS) w/Device KIT Use to check blood sugar 2 times per day. Dx code e11.9 1 kit 1  . carvedilol (COREG) 6.25 MG tablet Take 1 tablet (6.25 mg total) by mouth 2 (two) times daily. 60 tablet 11  . glucose blood (ACCU-CHEK AVIVA PLUS) test strip Use to check blood sugar 2 times per day. Dx code E11.9 100 each 2  . hydrALAZINE (APRESOLINE) 25 MG tablet Take 1 tablet (25 mg total) by mouth 3 (three) times daily. 90 tablet 6  . Insulin  Glargine (LANTUS SOLOSTAR) 100 UNIT/ML Solostar Pen Inject 70 Units into the skin every morning. 90 mL 3  . Insulin Pen Needle (B-D ULTRAFINE III SHORT PEN) 31G X 8 MM MISC USE AS DIRECTED 100 each 1  . Lancets (ACCU-CHEK MULTICLIX) lancets Use to check blood sugar 2 times per day. Dx code E11.9 100 each 2  . lisinopril (PRINIVIL,ZESTRIL) 20 MG tablet Take 1 tablet (20 mg total) by mouth daily. 30 tablet 6  . QUEtiapine (SEROQUEL) 25 MG tablet Take 0.5 tablets (12.5 mg total) by mouth at bedtime. 30 tablet 6   No current facility-administered medications on file prior to visit.    No Known Allergies   Assessment/Plan:  1.  Hypertension- Pt's BP is 20 pts lower today with changing from hydralazine to amlodipine.  Given his age and mobility, I am hesitant to increase his medications very quickly.  He has only been taking amlodipine for 1 week.  Will have him come back in 2 weeks.  If BP still elevated, will increase  amlodipine at that time.

## 2015-09-01 ENCOUNTER — Telehealth: Payer: Self-pay | Admitting: Cardiology

## 2015-09-01 NOTE — Telephone Encounter (Signed)
I did not call pt's daughter- appears as if Sammuel Bailiff was attempting to reach pt regarding Echo results.  Will route message to her.

## 2015-09-01 NOTE — Telephone Encounter (Signed)
Follow-up    The daughter is returning [facility] phone call.

## 2015-09-01 NOTE — Telephone Encounter (Signed)
Informed patient's DPR of results and verbal understanding expressed.

## 2015-09-01 NOTE — Telephone Encounter (Signed)
-----   Message from Sueanne Margarita, MD sent at 08/30/2015  9:26 PM EDT ----- Echo showed normal LVF with increased stiffness of heart muscle, severely calcified AV with no AS

## 2015-09-11 ENCOUNTER — Encounter: Payer: Commercial Managed Care - HMO | Admitting: Pharmacist

## 2015-09-11 NOTE — Progress Notes (Signed)
Patient ID: Marcus Weaver                 DOB: 09-16-1930                      MRN: [id number]     HPI: Marcus Weaver is a 82 y.o. male referred by Dr. Radford Pax to HTN clinic who presents today for follow up. PMH is significant for HTN, DM, dementia, CKD III, and HLD. Recently, hydralazine was changed to amlodipine for compliance reasons. Titrating BP medications slowly d/t patient's age.  Patient denies swelling, dizziness, blurred vision, or headache.  Current HTN meds: carvedilol 6.66m BID,amlodipine 533mdaily, lisinopril 2069maily  BP goal: <140/90 given age and DM  Diet: Pt does not add any salt to food or drink caffeinated beverages.   Home BP readings: Pt did not bring any cuff or home readings with him.   Wt Readings from Last 3 Encounters:  08/14/15 183 lb 6.4 oz (83.19 kg)  07/09/15 187 lb 6 oz (84.993 kg)  06/19/15 190 lb (86.183 kg)   BP Readings from Last 3 Encounters:  08/29/15 158/72  08/22/15 180/90  08/14/15 170/90   Pulse Readings from Last 3 Encounters:  08/29/15 72  08/22/15 60  08/14/15 81    Renal function: CrCl cannot be calculated (Unknown ideal weight.).  Past Medical History  Diagnosis Date  . Hypertension   . Diabetes mellitus     type2  . Hyperlipidemia   . UTI (lower urinary tract infection)   . CKD (chronic kidney disease)   . Hypertensive heart disease 08/14/2015  . Abnormal EKG 08/14/2015    Current Outpatient Prescriptions on File Prior to Visit  Medication Sig Dispense Refill  . Alcohol Swabs PADS Use daily for glucose testing. Dx. 250.00 120 each 3  . amLODipine (NORVASC) 5 MG tablet Take 1 tablet (5 mg total) by mouth daily. 30 tablet 6  . aspirin EC 81 MG tablet Take 81 mg by mouth daily.    . Blood Glucose Monitoring Suppl (ACCU-CHEK AVIVA PLUS) w/Device KIT Use to check blood sugar 2 times per day. Dx code e11.9 1 kit 1  . carvedilol (COREG) 6.25 MG tablet Take 1 tablet (6.25 mg total) by mouth 2 (two) times daily. 60 tablet 11    . glucose blood (ACCU-CHEK AVIVA PLUS) test strip Use to check blood sugar 2 times per day. Dx code E11.9 100 each 2  . hydrALAZINE (APRESOLINE) 25 MG tablet Take 1 tablet (25 mg total) by mouth 3 (three) times daily. 90 tablet 6  . Insulin Glargine (LANTUS SOLOSTAR) 100 UNIT/ML Solostar Pen Inject 70 Units into the skin every morning. 90 mL 3  . Insulin Pen Needle (B-D ULTRAFINE III SHORT PEN) 31G X 8 MM MISC USE AS DIRECTED 100 each 1  . Lancets (ACCU-CHEK MULTICLIX) lancets Use to check blood sugar 2 times per day. Dx code E11.9 100 each 2  . lisinopril (PRINIVIL,ZESTRIL) 20 MG tablet Take 1 tablet (20 mg total) by mouth daily. 30 tablet 6  . QUEtiapine (SEROQUEL) 25 MG tablet Take 0.5 tablets (12.5 mg total) by mouth at bedtime. 30 tablet 6   No current facility-administered medications on file prior to visit.    No Known Allergies   Assessment/Plan:  1. Hypertension -    This encounter was created in error - please disregard.

## 2015-09-19 ENCOUNTER — Ambulatory Visit (INDEPENDENT_AMBULATORY_CARE_PROVIDER_SITE_OTHER): Payer: Commercial Managed Care - HMO | Admitting: Pharmacist

## 2015-09-19 VITALS — BP 140/62 | HR 65

## 2015-09-19 DIAGNOSIS — I119 Hypertensive heart disease without heart failure: Secondary | ICD-10-CM | POA: Diagnosis not present

## 2015-09-19 NOTE — Progress Notes (Signed)
Patient ID: Marcus Weaver                 DOB: 07-Apr-1930                      MRN: [id number]     HPI: Marcus Weaver is a 82 y.o. male referred by Dr. Radford Pax to HTN clinic.  Dr. Radford Pax saw pt on 08/14/15 and BP was quite elevated.  He has a PMH significant for HTN, DM, dyslipidemia and palpitations.  Dr. Theodosia Blender plans were to try to consolidate pt's BP meds.  She changed metoprolol to carvediolol for better BP control.  Pt did bring all of his medications in last week.  He states his daughter puts his medications out for him each day to take.  I reviewed the dates on his bottles and the number of pills remaining. All of his medications were filled in April and were almost completely full bottles.  He has several twice a day medications and also hydralazine that is three times a day.  Pt states he is able to take medications three times a day but based on his bottles he has not be very compliant with any medication.  We switched his hydralazine to amlodipine at last visit and BP much improved.   Pt doing well since last visit. He denies any swelling, dizziness, blurred vision or headache.  He brought his bottles in today and also had an older bottle of nifedipine with 2 tablets.    Current HTN meds: carvedilol 6.61m BID,amlodipine 568mdaily, lisinopril 2052maily  BP goal: <150/90 given age and DM  Diet: Pt does not add any salt to food or drink caffeinated beverages.  He is working to cut out fried foods as well.  Home BP readings:  Pt did not bring any cuff or home readings with him.   Wt Readings from Last 3 Encounters:  08/14/15 183 lb 6.4 oz (83.19 kg)  07/09/15 187 lb 6 oz (84.993 kg)  06/19/15 190 lb (86.183 kg)   BP Readings from Last 3 Encounters:  09/19/15 140/62  08/29/15 158/72  08/22/15 180/90   Pulse Readings from Last 3 Encounters:  09/19/15 65  08/29/15 72  08/22/15 60    Renal function: CrCl cannot be calculated (Unknown ideal weight.).  Past Medical History    Diagnosis Date  . Hypertension   . Diabetes mellitus     type2  . Hyperlipidemia   . UTI (lower urinary tract infection)   . CKD (chronic kidney disease)   . Hypertensive heart disease 08/14/2015  . Abnormal EKG 08/14/2015    Current Outpatient Prescriptions on File Prior to Visit  Medication Sig Dispense Refill  . amLODipine (NORVASC) 5 MG tablet Take 1 tablet (5 mg total) by mouth daily. 30 tablet 6  . carvedilol (COREG) 6.25 MG tablet Take 1 tablet (6.25 mg total) by mouth 2 (two) times daily. 60 tablet 11  . lisinopril (PRINIVIL,ZESTRIL) 20 MG tablet Take 1 tablet (20 mg total) by mouth daily. 30 tablet 6  . Alcohol Swabs PADS Use daily for glucose testing. Dx. 250.00 120 each 3  . aspirin EC 81 MG tablet Take 81 mg by mouth daily.    . Blood Glucose Monitoring Suppl (ACCU-CHEK AVIVA PLUS) w/Device KIT Use to check blood sugar 2 times per day. Dx code e11.9 1 kit 1  . glucose blood (ACCU-CHEK AVIVA PLUS) test strip Use to check blood sugar 2 times per day. Dx code  E11.9 100 each 2  . Insulin Glargine (LANTUS SOLOSTAR) 100 UNIT/ML Solostar Pen Inject 70 Units into the skin every morning. 90 mL 3  . Insulin Pen Needle (B-D ULTRAFINE III SHORT PEN) 31G X 8 MM MISC USE AS DIRECTED 100 each 1  . Lancets (ACCU-CHEK MULTICLIX) lancets Use to check blood sugar 2 times per day. Dx code E11.9 100 each 2  . QUEtiapine (SEROQUEL) 25 MG tablet Take 0.5 tablets (12.5 mg total) by mouth at bedtime. 30 tablet 6   No current facility-administered medications on file prior to visit.    No Known Allergies   Assessment/Plan:  1.  Hypertension- Pt's BP at goal.  Unsure if he has been taking nifedipine with his amlodipine or not.  Will place an X on his nifedipine bottle so he does not take it any longer.  He has a follow up with his PCP and Melina Copa, PA next month.

## 2015-10-06 ENCOUNTER — Encounter: Payer: Self-pay | Admitting: Family Medicine

## 2015-10-06 ENCOUNTER — Ambulatory Visit (INDEPENDENT_AMBULATORY_CARE_PROVIDER_SITE_OTHER): Payer: Medicare HMO | Admitting: Family Medicine

## 2015-10-06 VITALS — BP 132/68 | HR 56 | Temp 98.6°F | Resp 17 | Ht 68.0 in | Wt 191.2 lb

## 2015-10-06 DIAGNOSIS — I10 Essential (primary) hypertension: Secondary | ICD-10-CM

## 2015-10-06 NOTE — Assessment & Plan Note (Signed)
BP was much better after pt was sitting in our office.  Asymptomatic at this time.  No med changes.  No need for labs.  Will follow.

## 2015-10-06 NOTE — Progress Notes (Signed)
   Subjective:    Patient ID: Marcus Weaver, male    DOB: 1931/03/22, 82 y.o.   MRN: [id number]  HPI HTN- chronic problem.  [facility] is now seeing [facility] due to poor BP control.  They found at their June visit that he was not taking his medications.  He is to be taking Coreg 6.25 BID, Amlodipine 5mg  daily, and Lisinopril 20mg  daily.  BP is again elevated today.  He has gained 8 lbs since last recorded weight on 5/25.  'i feel good'.  No CP, SOB, HAs, edema.  Pt reports taking meds this AM at 8am.   Review of Systems For ROS see HPI     Objective:   Physical Exam  Constitutional: He appears well-developed and well-nourished. No distress.  Smells strongly of cigarette smoke  HENT:  Head: Normocephalic and atraumatic.  Eyes: Conjunctivae and EOM are normal. Pupils are equal, round, and reactive to light.  Neck: Normal range of motion. Neck supple. No thyromegaly present.  Cardiovascular: Normal rate, regular rhythm, normal heart sounds and intact distal pulses.   No murmur heard. Pulmonary/Chest: Effort normal and breath sounds normal. No respiratory distress.  Abdominal: Soft. Bowel sounds are normal. He exhibits no distension.  Musculoskeletal: He exhibits no edema.  Lymphadenopathy:    He has no cervical adenopathy.  Neurological: He is alert.  Shuffling gait, ambulating w/ walker  Skin: Skin is warm and dry.  Psychiatric: He has a normal mood and affect. His behavior is normal.  Vitals reviewed.         Assessment & Plan:

## 2015-10-06 NOTE — Patient Instructions (Signed)
Follow up in October to recheck cholesterol You are due for an Endocrinology appt w/ Dr Loanne Drilling- please schedule No med changes at this time Call with any questions or concerns Have a great summer!!!

## 2015-10-06 NOTE — Progress Notes (Signed)
Pre visit review using our clinic review tool, if applicable. No additional management support is needed unless otherwise documented below in the visit note. 

## 2015-10-13 ENCOUNTER — Encounter: Payer: Self-pay | Admitting: Physician Assistant

## 2015-10-13 DIAGNOSIS — I10 Essential (primary) hypertension: Secondary | ICD-10-CM | POA: Insufficient documentation

## 2015-10-13 NOTE — Progress Notes (Addendum)
Cardiology Office Note    Date:  10/14/2015  ID:  Marcus Weaver, DOB 1930/11/12, MRN [id number] PCP:  Annye Asa, MD  Cardiologist:  Dr. Radford Pax   Chief Complaint: f/u tachycardia, HTN  History of Present Illness:  Marcus Weaver is a 82 y.o. male with history of HTN, DM2, hyperlipidemia, CKD stage II-III, tachycardia (undefined) who presents for follow-up. He saw Dr. Radford Pax 5/25 for new evaluation of elevated HR. Per her notes, he had an OV in 06/2015 with endocrinology at which time HR was noted to be 132bpm but no EKG was done. At the [facility] 07/2015 he was in NSR. 2D echo 08/29/15: EF 65-70%, grade 1 Dd, elevated LVEDP, mild LVH, severely calcified AV without stenosis, mildly dilated LA. 30 day monitor was recommended to exclude silent PAF but I do not see this ordered. Labs 06/2015: normal TSH, Hgb 12.4, Cr 1.33 (prev 1.6-2). He has since been followed in pharmacist-driven HTN clinic for med adjustment. Lipids followed by PCP.  He returns for follow-up feeling well. He has some left hand pain related to a prior fall, but otherwise has felt fine. No CP, SOB, LEE, orthopnea, PND, or history of stroke/ministroke. No palpitations. It should be noted that he did not have any awareness of elevated HR when it was previously reported to be high in 06/2015. He recently saw PCP for f/u of his BP. He states it was initially 150s which they said was due to him pushing down on his walker - recheck was 132/68. He follows his BP at home and reports he most often sees BPs 120-130s/70s.   Past Medical History:  Diagnosis Date  . Abnormal EKG 08/14/2015  . CKD (chronic kidney disease), stage II   . Diabetes mellitus    type2  . Hyperlipidemia   . Hypertension   . Hypertensive heart disease 08/14/2015  . Tachycardia    a. HR 132 at endo OV 06/2015, no EKG at that time.  Marland Kitchen UTI (lower urinary tract infection)     Past Surgical History:  Procedure Laterality Date  . CATARACT EXTRACTION      Current  Medications: Current Outpatient Prescriptions  Medication Sig Dispense Refill  . amLODipine (NORVASC) 5 MG tablet Take 1 tablet (5 mg total) by mouth daily. 30 tablet 6  . aspirin EC 81 MG tablet Take 81 mg by mouth daily.    . carvedilol (COREG) 6.25 MG tablet Take 1 tablet (6.25 mg total) by mouth 2 (two) times daily. 60 tablet 11  . hydrALAZINE (APRESOLINE) 25 MG tablet Take 1 tablet by mouth daily.    . Insulin Glargine (LANTUS SOLOSTAR) 100 UNIT/ML Solostar Pen Inject 70 Units into the skin every morning. 90 mL 3  . lisinopril (PRINIVIL,ZESTRIL) 20 MG tablet Take 1 tablet (20 mg total) by mouth daily. 30 tablet 6  . QUEtiapine (SEROQUEL) 25 MG tablet Take 25 mg by mouth at bedtime.     No current facility-administered medications for this visit.      Allergies:   Review of patient's allergies indicates no known allergies.   Social History   Social History  . Marital status: Single    Spouse name: N/A  . Number of children: N/A  . Years of education: N/A   Occupational History  . retired    Social History Main Topics  . Smoking status: Former Smoker    Types: Cigarettes    Quit date: 06/25/2015  . Smokeless tobacco: Never Used     Comment: smokes  3 or 4 cigarettes /day  . Alcohol use Yes     Comment: occassional  . Drug use: No  . Sexual activity: Not Asked   Other Topics Concern  . None   Social History Narrative  . None     Family History:  The patient's family history includes Diabetes in his other; Hypertension in his other. +HTN in father  ROS:   Please see the history of present illness.  All other systems are reviewed and otherwise negative.    PHYSICAL EXAM:   VS:  BP (!) 160/68 (BP Location: Left Arm)   Pulse (!) 55   Ht 5\' 8"  (1.727 m)   Wt 186 lb 12.8 oz (84.7 kg)   SpO2 98%   BMI 28.40 kg/m   BMI: Body mass index is 28.4 kg/m. GEN: Well nourished, well developed AAM, in no acute distress  HEENT: normocephalic, atraumatic. Cataract right  eye Neck: no JVD, carotid bruits, or masses Cardiac: RRR; no murmurs, rubs, or gallops, no edema  Respiratory:  clear to auscultation bilaterally, normal work of breathing GI: soft, nontender, nondistended, + BS MS: no deformity or atrophy  Skin: warm and dry, no rash Neuro:  Alert and Oriented x 3, Strength and sensation are intact, follows commands Psych: euthymic mood, full affect  Wt Readings from Last 3 Encounters:  10/14/15 186 lb 12.8 oz (84.7 kg)  10/06/15 191 lb 4 oz (86.8 kg)  08/14/15 183 lb 6.4 oz (83.2 kg)      Studies/Labs Reviewed:   EKG:  EKG was ordered today and personally reviewed by me and demonstrates sinus bradycardia 55bpm, no acute ST-T changes  Recent Labs: 05/16/2015: Magnesium 1.7 07/09/2015: ALT 9; BUN 12; Creatinine, Ser 1.33; Hemoglobin 12.4; Platelets 241.0; Potassium 4.7; Sodium 140; TSH 1.99   Lipid Panel    Component Value Date/Time   CHOL 121 07/09/2015 1100   TRIG 71.0 07/09/2015 1100   HDL 38.90 (L) 07/09/2015 1100   CHOLHDL 3 07/09/2015 1100   VLDL 14.2 07/09/2015 1100   LDLCALC 68 07/09/2015 1100    Additional studies/ records that were reviewed today include: Summarized above.    ASSESSMENT & PLAN:   1. Essential HTN - BP 160/68 in clinic with recheck 152/78 by me. It was 132/68 after recheck by PCP recently. He reports BPs of 120-130s/70s at home. Given his advanced age and history of falling I think it's reasonable to continue current regimen but we will ask him to clarify hydralazine dose at home. This was not on his recent med list at the pharmacist visit or mentioned in recent PCP OV. It appears this was brought in from 06/2015 encounter. He will continue to follow BP at home. I asked him to call if systolic is running [id number].  2. Hypertensive heart disease - no manifestations of heart failure at this time. Continue BP control. 3. Tachycardia - reported HR 132 in 06/2015, no EKG done at that time. Plan to order 30-day monitor as  previously recommended to assess for silent PAF. He has not had any symptoms of palpitations. 4. CKD stage II - further monitoring per primary care.  Disposition: F/u with Dr. Radford Pax in 6 months. Sooner if needed.   Medication Adjustments/Labs and Tests Ordered: Current medicines are reviewed at length with the patient today.  Concerns regarding medicines are outlined above. Medication changes, Labs and Tests ordered today are summarized above and listed in the Patient Instructions accessible in Encounters.   Signed, Melina Copa PA-C  10/14/2015 10:34 AM    [facility] [place], [place], [place]  [address] Phone: [phone]; Fax: [phone]

## 2015-10-14 ENCOUNTER — Telehealth: Payer: Self-pay | Admitting: *Deleted

## 2015-10-14 ENCOUNTER — Encounter: Payer: Self-pay | Admitting: Physician Assistant

## 2015-10-14 ENCOUNTER — Ambulatory Visit: Payer: Commercial Managed Care - HMO | Admitting: Cardiology

## 2015-10-14 ENCOUNTER — Ambulatory Visit (INDEPENDENT_AMBULATORY_CARE_PROVIDER_SITE_OTHER): Payer: Medicare HMO | Admitting: Physician Assistant

## 2015-10-14 VITALS — BP 160/68 | HR 55 | Ht 68.0 in | Wt 186.8 lb

## 2015-10-14 DIAGNOSIS — I119 Hypertensive heart disease without heart failure: Secondary | ICD-10-CM | POA: Diagnosis not present

## 2015-10-14 DIAGNOSIS — N182 Chronic kidney disease, stage 2 (mild): Secondary | ICD-10-CM | POA: Diagnosis not present

## 2015-10-14 DIAGNOSIS — R000 Tachycardia, unspecified: Secondary | ICD-10-CM

## 2015-10-14 DIAGNOSIS — I10 Essential (primary) hypertension: Secondary | ICD-10-CM | POA: Diagnosis not present

## 2015-10-14 NOTE — Patient Instructions (Addendum)
Medication Instructions:  Your physician recommends that you continue on your current medications as directed. Please refer to the Current Medication list given to you today.   Labwork: None ordered  Testing/Procedures: Your physician has recommended that you wear an event monitor. Event monitors are medical devices that record the heart's electrical activity. Doctors most often [place] these monitors to diagnose arrhythmias. Arrhythmias are problems with the speed or rhythm of the heartbeat. The monitor is a small, portable device. You can wear one while you do your normal daily activities. This is usually used to diagnose what is causing palpitations/syncope (passing out).   Follow-Up: Your physician wants you to follow-up in: [place].  You will receive a reminder letter in the mail two months in advance. If you don't receive a letter, please call our office to schedule the follow-up appointment.     Any Other Special Instructions Will Be Listed Below (If Applicable).  1.  Call our office [phone] if your blood pressure continues to run HIGHER THAN 140 ON THE TOP     Cardiac Event Monitoring A cardiac event monitor is a small recording device used to help detect abnormal heart rhythms (arrhythmias). The monitor is used to record heart rhythm when noticeable symptoms such as the following occur:  Fast heartbeats (palpitations), such as heart racing or fluttering.  Dizziness.  Fainting or light-headedness.  Unexplained weakness. The monitor is wired to two electrodes placed on your chest. Electrodes are flat, sticky disks that attach to your skin. The monitor can be worn for up to 30 days. You will wear the monitor at all times, except when bathing.  HOW TO USE YOUR CARDIAC EVENT MONITOR A technician will prepare your chest for the electrode placement. The technician will show you how to place the electrodes, how to work the monitor, and how to replace the  batteries. Take time to practice using the monitor before you leave the office. Make sure you understand how to send the information from the monitor to your health care provider. This requires a telephone with a landline, not a cell phone. You need to:  Wear your monitor at all times, except when you are in water:  Do not get the monitor wet.  Take the monitor off when bathing. Do not swim or use a hot tub with it on.  Keep your skin clean. Do not put body lotion or moisturizer on your chest.  Change the electrodes daily or any time they stop sticking to your skin. You might need to use tape to keep them on.  It is possible that your skin under the electrodes could become irritated. To keep this from happening, try to put the electrodes in slightly different places on your chest. However, they must remain in the area under your left breast and in the upper right section of your chest.  Make sure the monitor is safely clipped to your clothing or in a location close to your body that your health care provider recommends.  Press the button to record when you feel symptoms of heart trouble, such as dizziness, weakness, light-headedness, palpitations, thumping, shortness of breath, unexplained weakness, or a fluttering or racing heart. The monitor is always on and records what happened slightly before you pressed the button, so do not worry about being too late to get good information.  Keep a diary of your activities, such as walking, doing chores, and taking medicine. It is especially important to note what you were  doing when you pushed the button to record your symptoms. This will help your health care provider determine what might be contributing to your symptoms. The information stored in your monitor will be reviewed by your health care provider alongside your diary entries.  Send the recorded information as recommended by your health care provider. It is important to understand that it will  take some time for your health care provider to process the results.  Change the batteries as recommended by your health care provider. SEEK IMMEDIATE MEDICAL CARE IF:   You have chest pain.  You have extreme difficulty breathing or shortness of breath.  You develop a very fast heartbeat that persists.  You develop dizziness that does not go away.  You faint or constantly feel you are about to faint.   This information is not intended to replace advice given to you by your health care provider. Make sure you discuss any questions you have with your health care provider.   Document Released: 12/16/2007 Document Revised: 03/29/2014 Document Reviewed: 09/04/2012 Elsevier Interactive Patient Education [employer].   If you need a refill on your cardiac medications before your next appointment, please call your pharmacy.

## 2015-10-14 NOTE — Telephone Encounter (Signed)
lmptcb re:  Hydralazine.  09/19/15 was d/c by Elberta Leatherwood, pharmacist.  Pt stated today that he was still taking it.  Just need to let him know not to.

## 2015-10-15 ENCOUNTER — Ambulatory Visit (INDEPENDENT_AMBULATORY_CARE_PROVIDER_SITE_OTHER): Payer: Medicare HMO

## 2015-10-15 DIAGNOSIS — R000 Tachycardia, unspecified: Secondary | ICD-10-CM

## 2015-10-28 ENCOUNTER — Ambulatory Visit (INDEPENDENT_AMBULATORY_CARE_PROVIDER_SITE_OTHER): Payer: Medicare HMO | Admitting: Internal Medicine

## 2015-10-28 ENCOUNTER — Telehealth: Payer: Self-pay | Admitting: Endocrinology

## 2015-10-28 ENCOUNTER — Encounter: Payer: Self-pay | Admitting: Internal Medicine

## 2015-10-28 ENCOUNTER — Telehealth: Payer: Self-pay

## 2015-10-28 VITALS — BP 158/78 | HR 70 | Ht 67.0 in | Wt 194.0 lb

## 2015-10-28 DIAGNOSIS — E1136 Type 2 diabetes mellitus with diabetic cataract: Secondary | ICD-10-CM

## 2015-10-28 DIAGNOSIS — Z794 Long term (current) use of insulin: Secondary | ICD-10-CM | POA: Diagnosis not present

## 2015-10-28 MED ORDER — INSULIN GLARGINE 100 UNIT/ML SOLOSTAR PEN: 50.0000 [IU] | PEN_INJECTOR | SUBCUTANEOUS | 3 refills | Status: DC

## 2015-10-28 NOTE — Telephone Encounter (Signed)
Pt's daughter called this morning stating the pt's blood sugar dropped to 49 about 730 am. Paramedics are with the pt now treating the low blood sugar. Pt's daughter wanted to see if the pt could see Dr. Loanne Drilling today to follow up on insulin dosages. I advised the pt's daughter Dr. Loanne Drilling is out of town and would not be back until 8/14/20107. Spoke with the covering MD (Dr. Cruzita Lederer) about the pt's low blood sugar episode and she agreed to see the pt today at 345 pm. Pt's daughter advised of this and voiced understanding on appointment time.

## 2015-10-28 NOTE — Progress Notes (Signed)
HPI: Marcus Weaver is a 82 y.o.-year-old male, a pt of Dr. Cordelia Pen, coming for an emergent appt (at daughter's request) for insulin-dependent DM2. The appt was scheduled due to new hypoglycemia. Last appt with Dr Loanne Drilling 07/11/2015. He is here with his daughter, who offers part of the hx.  Yesterday am, he had hand cramping when woke up >> he was disoriented >> he fell  sugar checked: 49. This situation repeated this am >> CBG 49 >> EMT called >> given glucagon, milk >> 72 >> D5, coke >> 172.  He was given only 30 units of Lantus.   Last hemoglobin A1c was: Lab Results  Component Value Date   HGBA1C 8.7 (H) 05/15/2015   HGBA1C 8.9 05/05/2015   HGBA1C 10.9 02/25/2015   Pt is on a regimen of: - Lantus 70 units in am - changed from Humalog 70/30: 40 units before b'fast and 30 units before dinner >> he had a low CBG in 04/2015 >> was in hospital for 1 week >> then rehab  He was on Metformin >> stopped long time ago b/c CKD.  Pt checks his sugars 1-2x a day and they are: - am: 90, 120-130 - 2h after b'fast: n/c - before lunch: 65-100 - 2h after lunch: n/c - before dinner: 70-85 - 2h after dinner: n/c - bedtime: n/c - nighttime: n/c Lowest sugar was 49; he has hypoglycemia awareness at 70.  Highest sugar was 179.  - no CKD, last BUN/creatinine:  Lab Results  Component Value Date   BUN 12 07/09/2015   BUN 18 06/19/2015   CREATININE 1.33 07/09/2015   CREATININE 1.62 (H) 06/19/2015   - last set of lipids: Lab Results  Component Value Date   CHOL 121 07/09/2015   HDL 38.90 (L) 07/09/2015   LDLCALC 68 07/09/2015   TRIG 71.0 07/09/2015   CHOLHDL 3 07/09/2015    ROS: Constitutional: no weight gain/loss, no fatigue, no subjective hyperthermia/hypothermia Eyes: no blurry vision, no xerophthalmia ENT: no sore throat, no nodules palpated in throat, no dysphagia/odynophagia, no hoarseness Cardiovascular: no CP/SOB/palpitations/leg swelling Respiratory: no  cough/SOB Gastrointestinal: no N/V/D/C Musculoskeletal: no muscle/joint aches Skin: no rashes Neurological: no tremors/numbness/tingling/dizziness Psychiatric: no depression/anxiety  PE: BP (!) 158/78 (BP Location: Right Arm, Patient Position: Sitting)   Pulse 70   Ht 5\' 7"  (1.702 m)   Wt 194 lb (88 kg)   SpO2 99%   BMI 30.38 kg/m  Wt Readings from Last 3 Encounters:  10/28/15 194 lb (88 kg)  10/14/15 186 lb 12.8 oz (84.7 kg)  10/06/15 191 lb 4 oz (86.8 kg)   Constitutional: overweight, in NAD, walks with a walker Eyes: PERRLA, EOMI, no exophthalmos ENT: moist mucous membranes, no thyromegaly, no cervical lymphadenopathy Cardiovascular: RRR, No MRG Respiratory: CTA B Gastrointestinal: abdomen soft, NT, ND, BS+ Musculoskeletal: no deformities, strength intact in all 4 Skin: moist, warm, no rashes Neurological: no tremor with outstretched hands, DTR normal in all 4  ASSESSMENT: 1. DM2, insulin-dependent, uncontrolled, without complications  PLAN:  1. Patient with long-standing, uncontrolled diabetes, on basal insulin, with low blood sugars in last 2 mornings. The rest of the sugars are on the low side, also >> will decrease Lantus dose. - I suggested to:  Patient Instructions  Please decrease the Lantus to 50 units in am.  Please let me know about the sugars by the end of the week.  Please schedule a new appt with Dr Loanne Drilling in 2-3 weeks.  - continue checking sugars at different times  of the day - check 2-3 times a day, rotating checks - schedule a new appt with Dr Loanne Drilling in 2-3 weeks so he can evaluate the new tx dose  Philemon Kingdom, MD PhD [facility]

## 2015-10-28 NOTE — Patient Instructions (Signed)
Please decrease the Lantus to 50 units in am.  Please let me know about the sugars by the end of the week.  Please schedule a new appt with Dr Loanne Drilling in 2-3 weeks.

## 2015-10-28 NOTE — Telephone Encounter (Signed)
ERROR

## 2015-11-18 ENCOUNTER — Ambulatory Visit (INDEPENDENT_AMBULATORY_CARE_PROVIDER_SITE_OTHER): Payer: Medicare HMO | Admitting: Endocrinology

## 2015-11-18 ENCOUNTER — Encounter: Payer: Self-pay | Admitting: Endocrinology

## 2015-11-18 VITALS — BP 144/76 | HR 62 | Ht 67.0 in | Wt 192.0 lb

## 2015-11-18 DIAGNOSIS — Z794 Long term (current) use of insulin: Secondary | ICD-10-CM | POA: Diagnosis not present

## 2015-11-18 DIAGNOSIS — E1122 Type 2 diabetes mellitus with diabetic chronic kidney disease: Secondary | ICD-10-CM

## 2015-11-18 DIAGNOSIS — N183 Chronic kidney disease, stage 3 (moderate): Secondary | ICD-10-CM | POA: Diagnosis not present

## 2015-11-18 MED ORDER — INSULIN GLARGINE 100 UNIT/ML SOLOSTAR PEN: 70.0000 [IU] | PEN_INJECTOR | SUBCUTANEOUS | 3 refills | Status: DC

## 2015-11-18 NOTE — Progress Notes (Signed)
Subjective:    Patient ID: Marcus Weaver, male    DOB: 1930-04-04, 82 y.o.   MRN: [id number]  HPI Pt returns for f/u of diabetes mellitus: DM type: Insulin-requiring type 2 Dx'ed: [id number] Complications: renal insufficiency and neuropathy.  Therapy: insulin since 2014 (and metformin).   DKA: never.   Severe hypoglycemia: twice (2011 and 2017).   Pancreatitis: never.   Other: he was changed to QD insulin, after poor results with bid premixed insulin, and also with multiple daily injections.   Interval history: He brings a scant record of his cbg's which I have reviewed today.  It varies from 87-281.  It is in general higher as the day goes on.  He takes 80 units qam.  pt states he feels well in general.   Past Medical History:  Diagnosis Date  . Abnormal EKG 08/14/2015  . CKD (chronic kidney disease), stage II   . Diabetes mellitus    type2  . Hyperlipidemia   . Hypertension   . Hypertensive heart disease 08/14/2015  . Tachycardia    a. HR 132 at endo OV 06/2015, no EKG at that time.  Marland Kitchen UTI (lower urinary tract infection)     Past Surgical History:  Procedure Laterality Date  . CATARACT EXTRACTION      Social History   Social History  . Marital status: Single    Spouse name: N/A  . Number of children: N/A  . Years of education: N/A   Occupational History  . retired    Social History Main Topics  . Smoking status: Former Smoker    Types: Cigarettes    Quit date: 06/25/2015  . Smokeless tobacco: Never Used     Comment: smokes 3 or 4 cigarettes /day  . Alcohol use Yes     Comment: occassional  . Drug use: No  . Sexual activity: Not on file   Other Topics Concern  . Not on file   Social History Narrative  . No narrative on file    Current Outpatient Prescriptions on File Prior to Visit  Medication Sig Dispense Refill  . amLODipine (NORVASC) 5 MG tablet Take 1 tablet (5 mg total) by mouth daily. 30 tablet 6  . aspirin EC 81 MG tablet Take 81 mg by mouth daily.      . carvedilol (COREG) 6.25 MG tablet Take 1 tablet (6.25 mg total) by mouth 2 (two) times daily. 60 tablet 11  . lisinopril (PRINIVIL,ZESTRIL) 20 MG tablet Take 1 tablet (20 mg total) by mouth daily. 30 tablet 6  . QUEtiapine (SEROQUEL) 25 MG tablet Take 25 mg by mouth at bedtime.     No current facility-administered medications on file prior to visit.     No Known Allergies  Family History  Problem Relation Age of Onset  . Cancer Neg Hx   . Diabetes Other     1st degree relative  . Hypertension Other     BP (!) 144/76   Pulse 62   Ht 5\' 7"  (1.702 m)   Wt 192 lb (87.1 kg)   BMI 30.07 kg/m   Review of Systems No weight change.    Objective:   Physical Exam VITAL SIGNS:  See vs page GENERAL: no distress Pulses: dorsalis pedis intact bilat.   MSK: no deformity of the feet CV: 1+ bilat leg edema Skin:  no ulcer on the feet, but the skin is dry.  normal color and temp on the feet. Neuro: sensation is intact to touch  on the feet.   Ext: There is bilateral onychomycosis of the toenails.      Assessment & Plan:  Insulin-requiring type 2 DM: overcontrolled Severe hypoglycemia: in view of this and advanced age, he is not a candidate for aggressive glycemic control.

## 2015-11-18 NOTE — Patient Instructions (Addendum)
Please reduce the lantus to 70 units each morning.   On this type of insulin schedule, you should eat meals on a regular schedule.  If a meal is missed or significantly delayed, your blood sugar could go low.  check your blood sugar twice a day.  vary the time of day when you check, between before the 3 meals, and at bedtime.  also check if you have symptoms of your blood sugar being too high or too low.  please keep a record of the readings and bring it to your next appointment here.  You can write it on any piece of paper.  please call us sooner if your blood sugar goes below 70, or if you have a lot of readings over 200.   Please come back for a follow-up appointment in 3 months.

## 2015-12-19 ENCOUNTER — Telehealth: Payer: Self-pay | Admitting: Family Medicine

## 2015-12-19 DIAGNOSIS — E119 Type 2 diabetes mellitus without complications: Secondary | ICD-10-CM

## 2015-12-19 NOTE — Telephone Encounter (Signed)
Daughter asking for a referral for pt to go to [facility] for toenail cut.

## 2015-12-22 NOTE — Telephone Encounter (Signed)
Referral placed.

## 2015-12-31 ENCOUNTER — Ambulatory Visit (INDEPENDENT_AMBULATORY_CARE_PROVIDER_SITE_OTHER): Payer: Commercial Managed Care - HMO | Admitting: Podiatry

## 2015-12-31 ENCOUNTER — Encounter: Payer: Self-pay | Admitting: Podiatry

## 2015-12-31 DIAGNOSIS — M79674 Pain in right toe(s): Secondary | ICD-10-CM

## 2015-12-31 DIAGNOSIS — B351 Tinea unguium: Secondary | ICD-10-CM

## 2015-12-31 DIAGNOSIS — M79675 Pain in left toe(s): Secondary | ICD-10-CM | POA: Diagnosis not present

## 2015-12-31 NOTE — Patient Instructions (Signed)
Diabetes and Foot Care Diabetes may cause you to have problems because of poor blood supply (circulation) to your feet and legs. This may cause the skin on your feet to become thinner, break easier, and heal more slowly. Your skin may become dry, and the skin may peel and crack. You may also have nerve damage in your legs and feet causing decreased feeling in them. You may not notice minor injuries to your feet that could lead to infections or more serious problems. Taking care of your feet is one of the most important things you can do for yourself.  HOME CARE INSTRUCTIONS  Wear shoes at all times, even in the house. Do not go barefoot. Bare feet are easily injured.  Check your feet daily for blisters, cuts, and redness. If you cannot see the bottom of your feet, use a mirror or ask someone for help.  Wash your feet with warm water (do not use hot water) and mild soap. Then pat your feet and the areas between your toes until they are completely dry. Do not soak your feet as this can dry your skin.  Apply a moisturizing lotion or petroleum jelly (that does not contain alcohol and is unscented) to the skin on your feet and to dry, brittle toenails. Do not apply lotion between your toes.  Trim your toenails straight across. Do not dig under them or around the cuticle. File the edges of your nails with an emery board or nail file.  Do not cut corns or calluses or try to remove them with medicine.  Wear clean socks or stockings every day. Make sure they are not too tight. Do not wear knee-high stockings since they may decrease blood flow to your legs.  Wear shoes that fit properly and have enough cushioning. To break in new shoes, wear them for just a few hours a day. This prevents you from injuring your feet. Always look in your shoes before you put them on to be sure there are no objects inside.  Do not cross your legs. This may decrease the blood flow to your feet.  If you find a minor scrape,  cut, or break in the skin on your feet, keep it and the skin around it clean and dry. These areas may be cleansed with mild soap and water. Do not cleanse the area with peroxide, alcohol, or iodine.  When you remove an adhesive bandage, be sure not to damage the skin around it.  If you have a wound, look at it several times a day to make sure it is healing.  Do not use heating pads or hot water bottles. They may burn your skin. If you have lost feeling in your feet or legs, you may not know it is happening until it is too late.  Make sure your health care provider performs a complete foot exam at least annually or more often if you have foot problems. Report any cuts, sores, or bruises to your health care provider immediately. SEEK MEDICAL CARE IF:   You have an injury that is not healing.  You have cuts or breaks in the skin.  You have an ingrown nail.  You notice redness on your legs or feet.  You feel burning or tingling in your legs or feet.  You have pain or cramps in your legs and feet.  Your legs or feet are numb.  Your feet always feel cold. SEEK IMMEDIATE MEDICAL CARE IF:   There is increasing redness,   swelling, or pain in or around a wound.  There is a red line that goes up your leg.  Pus is coming from a wound.  You develop a fever or as directed by your health care provider.  You notice a bad smell coming from an ulcer or wound.   This information is not intended to replace advice given to you by your health care provider. Make sure you discuss any questions you have with your health care provider.   Document Released: 03/05/2000 Document Revised: 11/08/2012 Document Reviewed: 08/15/2012 Elsevier Interactive Patient Education 2016 Elsevier Inc.  

## 2015-12-31 NOTE — Progress Notes (Signed)
Patient ID: Marcus Weaver, male   DOB: 04-14-1930, 82 y.o.   MRN: [id number]    Subjective: This patient presents today with his daughter treatment room who is requesting debridement of her others thickened and elongated and painful toenails. The last visit for this service was on 06/25/2013  Patient is diabetic without history of amputation or claudication  Objective: Patient appears confused  Vascular: Trace palpable DP and PT pulses bilaterally Capillary reflex delay bilaterally  Neurological: Patient has difficulty responding to 10 g monofilament wire and vibratory sensation Ankle reflex equal and reactive bilaterally  Dermatological: No open skin lesions bilaterally The toenails are extremely elongated, hypertrophic, deformed, discolored and tender to direct palpation 6-10  Musculoskeletal: HAV deformities bilaterally Hammertoe second left  Assessment: Type II diabetic Decrease pedal pulses bilaterally without any open skin lesions bilaterally Symptomatic onychomycoses 6-10  Plan: Debridement of toenails 6-10 mechanically and likely without any bleeding  Reappoint intervals recommended at 3 months

## 2016-01-06 ENCOUNTER — Ambulatory Visit: Payer: Medicare HMO | Admitting: Family Medicine

## 2016-01-08 ENCOUNTER — Ambulatory Visit (INDEPENDENT_AMBULATORY_CARE_PROVIDER_SITE_OTHER): Payer: Medicare HMO | Admitting: Family Medicine

## 2016-01-08 ENCOUNTER — Encounter: Payer: Self-pay | Admitting: Family Medicine

## 2016-01-08 VITALS — BP 140/78 | HR 82 | Temp 97.9°F | Resp 17 | Ht 67.0 in | Wt 196.0 lb

## 2016-01-08 DIAGNOSIS — I10 Essential (primary) hypertension: Secondary | ICD-10-CM

## 2016-01-08 DIAGNOSIS — N183 Chronic kidney disease, stage 3 (moderate): Secondary | ICD-10-CM

## 2016-01-08 DIAGNOSIS — Z794 Long term (current) use of insulin: Secondary | ICD-10-CM

## 2016-01-08 DIAGNOSIS — E1122 Type 2 diabetes mellitus with diabetic chronic kidney disease: Secondary | ICD-10-CM | POA: Diagnosis not present

## 2016-01-08 DIAGNOSIS — F0390 Unspecified dementia without behavioral disturbance: Secondary | ICD-10-CM | POA: Diagnosis not present

## 2016-01-08 DIAGNOSIS — E1169 Type 2 diabetes mellitus with other specified complication: Secondary | ICD-10-CM

## 2016-01-08 DIAGNOSIS — Z23 Encounter for immunization: Secondary | ICD-10-CM | POA: Diagnosis not present

## 2016-01-08 DIAGNOSIS — E785 Hyperlipidemia, unspecified: Secondary | ICD-10-CM | POA: Diagnosis not present

## 2016-01-08 LAB — HEMOGLOBIN A1C: Hgb A1c MFr Bld: 7.9 % — ABNORMAL HIGH (ref 4.6–6.5)

## 2016-01-08 LAB — CBC WITH DIFFERENTIAL/PLATELET
BASOS ABS: 0 10*3/uL (ref 0.0–0.1)
Basophils Relative: 0.4 % (ref 0.0–3.0)
EOS ABS: 0.7 10*3/uL (ref 0.0–0.7)
Eosinophils Relative: 7.1 % — ABNORMAL HIGH (ref 0.0–5.0)
HEMATOCRIT: 37.1 % — AB (ref 39.0–52.0)
Hemoglobin: 12.2 g/dL — ABNORMAL LOW (ref 13.0–17.0)
LYMPHS ABS: 2.2 10*3/uL (ref 0.7–4.0)
LYMPHS PCT: 23.3 % (ref 12.0–46.0)
MCHC: 33 g/dL (ref 30.0–36.0)
MCV: 90.2 fl (ref 78.0–100.0)
MONO ABS: 0.6 10*3/uL (ref 0.1–1.0)
Monocytes Relative: 6.3 % (ref 3.0–12.0)
NEUTROS ABS: 6 10*3/uL (ref 1.4–7.7)
NEUTROS PCT: 62.9 % (ref 43.0–77.0)
PLATELETS: 186 10*3/uL (ref 150.0–400.0)
RBC: 4.11 Mil/uL — ABNORMAL LOW (ref 4.22–5.81)
RDW: 13.9 % (ref 11.5–15.5)
WBC: 9.6 10*3/uL (ref 4.0–10.5)

## 2016-01-08 LAB — LIPID PANEL
CHOL/HDL RATIO: 4
Cholesterol: 127 mg/dL (ref 0–200)
HDL: 35 mg/dL — ABNORMAL LOW (ref >39.00)
LDL CALC: 70 mg/dL (ref 0–99)
NonHDL: 92.07
TRIGLYCERIDES: 112 mg/dL (ref 0.0–149.0)
VLDL: 22.4 mg/dL (ref 0.0–40.0)

## 2016-01-08 LAB — BASIC METABOLIC PANEL WITH GFR
BUN: 16 mg/dL (ref 6–23)
CO2: 30 meq/L (ref 19–32)
Calcium: 9.3 mg/dL (ref 8.4–10.5)
Chloride: 109 meq/L (ref 96–112)
Creatinine, Ser: 1.33 mg/dL (ref 0.40–1.50)
GFR: 65.93 mL/min
Glucose, Bld: 115 mg/dL — ABNORMAL HIGH (ref 70–99)
Potassium: 4.3 meq/L (ref 3.5–5.1)
Sodium: 145 meq/L (ref 135–145)

## 2016-01-08 LAB — TSH: TSH: 2.25 u[IU]/mL (ref 0.35–4.50)

## 2016-01-08 LAB — HEPATIC FUNCTION PANEL
ALBUMIN: 3.5 g/dL (ref 3.5–5.2)
ALT: 11 U/L (ref 0–53)
AST: 12 U/L (ref 0–37)
Alkaline Phosphatase: 75 U/L (ref 39–117)
BILIRUBIN TOTAL: 0.4 mg/dL (ref 0.2–1.2)
Bilirubin, Direct: 0.1 mg/dL (ref 0.0–0.3)
Total Protein: 6.6 g/dL (ref 6.0–8.3)

## 2016-01-08 MED ORDER — QUETIAPINE FUMARATE 25 MG PO TABS: 25.0000 mg | ORAL_TABLET | Freq: Every day | ORAL | 1 refills | Status: DC

## 2016-01-08 NOTE — Progress Notes (Signed)
Pre visit review using our clinic review tool, if applicable. No additional management support is needed unless otherwise documented below in the visit note. 

## 2016-01-08 NOTE — Progress Notes (Signed)
   Subjective:    Patient ID: Marcus Weaver, male    DOB: 21-Jun-1930, 82 y.o.   MRN: [id number]  HPI HTN- chronic problem, on Lisinopril 20mg , Coreg, Amlodipine w/ much better control today.  No CP, SOB, HAs, edema  Hyperlipidemia- chronic problem.  Not currently on statin and last LDL was well at goal, 68.    DM- chronic problem.  Following w/ Endo.  No recorded A1C since Feb.    Dementia- chronic problem, daughter reports he is doing well at home.  No longer wandering.  Doing well on Seroquel nightly.  No hallucinations or acting out.    Review of Systems For ROS see HPI     Objective:   Physical Exam  Constitutional: He appears well-developed and well-nourished. No distress.  HENT:  Head: Normocephalic and atraumatic.  Eyes:  R cataract L pupil ERRL  Neck: Normal range of motion. Neck supple. No thyromegaly present.  Cardiovascular: Normal rate, regular rhythm, normal heart sounds and intact distal pulses.   Pulmonary/Chest: Effort normal and breath sounds normal. No respiratory distress. He has no wheezes. He has no rales.  Abdominal: Soft. Bowel sounds are normal. He exhibits no distension. There is no tenderness. There is no rebound and no guarding.  Lymphadenopathy:    He has no cervical adenopathy.  Neurological: He is alert.  Demented- oriented to person and place  Skin: Skin is warm and dry.  Psychiatric: He has a normal mood and affect. His behavior is normal.  Vitals reviewed.         Assessment & Plan:

## 2016-01-08 NOTE — Assessment & Plan Note (Signed)
Ongoing issue for pt.  He is no longer wandering and there are no behavioral disturbances.  Daughter reports he is doing well on his Seroquel.  Refills provided.  Will follow.

## 2016-01-08 NOTE — Assessment & Plan Note (Signed)
Much better control today.  Asymptomatic.  Check labs.  No anticipated med changes.

## 2016-01-08 NOTE — Assessment & Plan Note (Signed)
Chronic problem.  In recent years, pt has been able to control w/ healthy diet and come off his medications.  He is not able to exercise due to his dementia and his use of a walker but applauded his daughter's efforts at healthier meals.  Check labs.  Adjust meds prn

## 2016-01-08 NOTE — Assessment & Plan Note (Signed)
Chronic problem.  Pt's daughter is very upset that there is no record of A1C since Feb when pt has seen Endo multiple times.  She is asking for all blood work to be done today since pt is already getting labs.  Will get labs and forward to Dr Loanne Drilling for review.

## 2016-01-08 NOTE — Patient Instructions (Signed)
Schedule your complete physical in 6 months We'll notify you of your lab results and make any changes if needed Continue to make healthy food choices- you look good! Continue the Seroquel nightly Call with any questions or concerns Happy Fall!!!

## 2016-01-09 ENCOUNTER — Encounter: Payer: Self-pay | Admitting: General Practice

## 2016-01-19 ENCOUNTER — Other Ambulatory Visit: Payer: Self-pay | Admitting: Family Medicine

## 2016-02-15 NOTE — Progress Notes (Signed)
Subjective:    Patient ID: Marcus Weaver, male    DOB: January 01, 1931, 82 y.o.   MRN: [id number]  HPI Pt returns for f/u of diabetes mellitus: DM type: Insulin-requiring type 2 Dx'ed: 0569 Complications: renal insufficiency and neuropathy.  Therapy: insulin since 2014 (and metformin).   DKA: never.   Severe hypoglycemia: twice (2011 and 2017).   Pancreatitis: never.   Other: he was changed to QD insulin, after poor results with bid premixed insulin, and also with multiple daily injections.  Interval history: no cbg record, but states cbg's vary from 85-200's.  It is in general higher as the day goes on.  pt states he feels well in general, except for unsteadiness of the feet.   Past Medical History:  Diagnosis Date  . Abnormal EKG 08/14/2015  . CKD (chronic kidney disease), stage II   . Diabetes mellitus    type2  . Hyperlipidemia   . Hypertension   . Hypertensive heart disease 08/14/2015  . Tachycardia    a. HR 132 at endo OV 06/2015, no EKG at that time.  Marland Kitchen UTI (lower urinary tract infection)     Past Surgical History:  Procedure Laterality Date  . CATARACT EXTRACTION      Social History   Social History  . Marital status: Single    Spouse name: N/A  . Number of children: N/A  . Years of education: N/A   Occupational History  . retired    Social History Main Topics  . Smoking status: Former Smoker    Types: Cigarettes    Quit date: 06/25/2015  . Smokeless tobacco: Never Used     Comment: smokes 3 or 4 cigarettes /day  . Alcohol use Yes     Comment: occassional  . Drug use: No  . Sexual activity: Not on file   Other Topics Concern  . Not on file   Social History Narrative  . No narrative on file    Current Outpatient Prescriptions on File Prior to Visit  Medication Sig Dispense Refill  . amLODipine (NORVASC) 5 MG tablet Take 1 tablet (5 mg total) by mouth daily. 30 tablet 6  . aspirin EC 81 MG tablet Take 81 mg by mouth daily.    . carvedilol (COREG) 6.25  MG tablet Take 1 tablet (6.25 mg total) by mouth 2 (two) times daily. 60 tablet 11  . Insulin Glargine (LANTUS SOLOSTAR) 100 UNIT/ML Solostar Pen Inject 70 Units into the skin every morning. 90 mL 3  . lisinopril (PRINIVIL,ZESTRIL) 20 MG tablet TAKE 1 TABLET(20 MG) BY MOUTH DAILY 30 tablet 6  . QUEtiapine (SEROQUEL) 25 MG tablet Take 1 tablet (25 mg total) by mouth at bedtime. 90 tablet 1   No current facility-administered medications on file prior to visit.     No Known Allergies  Family History  Problem Relation Age of Onset  . Diabetes Other     1st degree relative  . Hypertension Other   . Cancer Neg Hx    BP (!) 142/64   Pulse 65   Ht 5\' 7"  (1.702 m)   Wt 196 lb (88.9 kg)   SpO2 97%   BMI 30.70 kg/m   Review of Systems He denies hypoglycemia.      Objective:   Physical Exam VITAL SIGNS:  See vs page GENERAL: no distress Pulses: dorsalis pedis intact bilat.   MSK: no deformity of the feet CV: trace bilat leg edema Skin:  no ulcer on the feet, but the  skin is dry.  normal color and temp on the feet. Neuro: sensation is intact to touch on the feet.   Ext: There is bilateral onychomycosis of the toenails.    Lab Results  Component Value Date   HGBA1C 7.9 (H) 01/08/2016      Assessment & Plan:  Insulin-requiring type 2 DM, with renal insufficiency: this is the best control this pt should aim for, given this regimen, which does match insulin to his changing needs throughout the day Severe hypoglycemia: in view of this and advanced age, he is not a candidate for aggressive glycemic control.  Patient is advised the following: Patient Instructions  Please continue the same lantus: 70 units each morning.   On this type of insulin schedule, you should eat meals on a regular schedule.  If a meal is missed or significantly delayed, your blood sugar could go low.  check your blood sugar twice a day.  vary the time of day when you check, between before the 3 meals, and at  bedtime.  also check if you have symptoms of your blood sugar being too high or too low.  please keep a record of the readings and bring it to your next appointment here.  You can write it on any piece of paper.  please call us sooner if your blood sugar goes below 70, or if you have a lot of readings over 200.   Please come back for a follow-up appointment in 4 months.

## 2016-02-18 ENCOUNTER — Ambulatory Visit (INDEPENDENT_AMBULATORY_CARE_PROVIDER_SITE_OTHER): Payer: Medicare HMO | Admitting: Endocrinology

## 2016-02-18 ENCOUNTER — Encounter: Payer: Self-pay | Admitting: Endocrinology

## 2016-02-18 VITALS — BP 142/64 | HR 65 | Ht 67.0 in | Wt 196.0 lb

## 2016-02-18 DIAGNOSIS — E1122 Type 2 diabetes mellitus with diabetic chronic kidney disease: Secondary | ICD-10-CM

## 2016-02-18 DIAGNOSIS — Z794 Long term (current) use of insulin: Secondary | ICD-10-CM | POA: Diagnosis not present

## 2016-02-18 DIAGNOSIS — N183 Chronic kidney disease, stage 3 (moderate): Secondary | ICD-10-CM | POA: Diagnosis not present

## 2016-02-18 NOTE — Patient Instructions (Addendum)
Please continue the same lantus: 70 units each morning.   On this type of insulin schedule, you should eat meals on a regular schedule.  If a meal is missed or significantly delayed, your blood sugar could go low.  check your blood sugar twice a day.  vary the time of day when you check, between before the 3 meals, and at bedtime.  also check if you have symptoms of your blood sugar being too high or too low.  please keep a record of the readings and bring it to your next appointment here.  You can write it on any piece of paper.  please call us sooner if your blood sugar goes below 70, or if you have a lot of readings over 200.   Please come back for a follow-up appointment in 4 months.

## 2016-03-10 ENCOUNTER — Other Ambulatory Visit: Payer: Self-pay | Admitting: Cardiology

## 2016-03-24 ENCOUNTER — Telehealth: Payer: Self-pay | Admitting: Endocrinology

## 2016-03-24 ENCOUNTER — Other Ambulatory Visit: Payer: Self-pay | Admitting: Endocrinology

## 2016-03-24 NOTE — Telephone Encounter (Signed)
Refill submitted. 

## 2016-03-24 NOTE — Telephone Encounter (Signed)
Patient need refill of LANTUS SOLOSTAR 100 UNIT/ML Solostar Pen    Today if possible  [facility] [id number] [address] [place], [place] [phone] (Phone) [phone] (Fax)

## 2016-03-31 ENCOUNTER — Encounter: Payer: Self-pay | Admitting: Podiatry

## 2016-03-31 ENCOUNTER — Ambulatory Visit (INDEPENDENT_AMBULATORY_CARE_PROVIDER_SITE_OTHER): Payer: PPO | Admitting: Podiatry

## 2016-03-31 DIAGNOSIS — M79674 Pain in right toe(s): Secondary | ICD-10-CM

## 2016-03-31 DIAGNOSIS — B351 Tinea unguium: Secondary | ICD-10-CM

## 2016-03-31 DIAGNOSIS — M79675 Pain in left toe(s): Secondary | ICD-10-CM | POA: Diagnosis not present

## 2016-03-31 NOTE — Patient Instructions (Signed)

## 2016-03-31 NOTE — Progress Notes (Signed)
Patient ID: Marcus Weaver, male   DOB: [birth year], 83 y.o.   MRN: [id number]    Subjective: This patient presents today with his daughter treatment room who is requesting debridement of her others thickened and elongated and painful toenails.  Patient is diabetic without history of amputation or claudication  Objective: Patient appears confused  Vascular: Trace palpable DP and PT pulses bilaterally Capillary reflex delay bilaterally  Neurological: Patient has difficulty responding to 10 g monofilament wire and vibratory sensation Ankle reflex equal and reactive bilaterally  Dermatological: No open skin lesions bilaterally The toenails are extremely elongated, hypertrophic, deformed, discolored and tender to direct palpation 6-10  Musculoskeletal: HAV deformities bilaterally Hammertoe second left  Assessment: Type II diabetic Decrease pedal pulses bilaterally without any open skin lesions bilaterally Symptomatic onychomycoses 6-10  Plan: Debridement of toenails 6-10 mechanically and likely without any bleeding  Reappoint intervals recommended at 3 months

## 2016-05-21 ENCOUNTER — Telehealth: Payer: Self-pay | Admitting: Endocrinology

## 2016-05-21 ENCOUNTER — Other Ambulatory Visit: Payer: Self-pay

## 2016-05-21 MED ORDER — INSULIN GLARGINE 100 UNIT/ML SOLOSTAR PEN: 70.0000 [IU] | PEN_INJECTOR | SUBCUTANEOUS | 3 refills | Status: DC

## 2016-05-21 NOTE — Telephone Encounter (Signed)
PA is needed for the insulin pen he is on his last pen now, he should have enough to last until monday

## 2016-05-21 NOTE — Telephone Encounter (Signed)
I contacted the pharmacy. Rx is not needing a PA just a refill. Rx submitted to [facility] on [address].

## 2016-06-16 ENCOUNTER — Ambulatory Visit (INDEPENDENT_AMBULATORY_CARE_PROVIDER_SITE_OTHER): Payer: PPO | Admitting: Endocrinology

## 2016-06-16 ENCOUNTER — Encounter: Payer: Self-pay | Admitting: Endocrinology

## 2016-06-16 VITALS — BP 136/72 | HR 75 | Ht 67.0 in | Wt 215.0 lb

## 2016-06-16 DIAGNOSIS — N183 Chronic kidney disease, stage 3 unspecified: Secondary | ICD-10-CM

## 2016-06-16 DIAGNOSIS — Z794 Long term (current) use of insulin: Secondary | ICD-10-CM

## 2016-06-16 DIAGNOSIS — E1122 Type 2 diabetes mellitus with diabetic chronic kidney disease: Secondary | ICD-10-CM | POA: Diagnosis not present

## 2016-06-16 LAB — POCT GLYCOSYLATED HEMOGLOBIN (HGB A1C): HEMOGLOBIN A1C: 7.3

## 2016-06-16 MED ORDER — GLUCOSE BLOOD VI STRP: 1.0000 | ORAL_STRIP | Freq: Two times a day (BID) | 3 refills | Status: DC

## 2016-06-16 MED ORDER — INSULIN GLARGINE 100 UNIT/ML SOLOSTAR PEN: 60.0000 [IU] | PEN_INJECTOR | SUBCUTANEOUS | 3 refills | Status: DC

## 2016-06-16 NOTE — Patient Instructions (Addendum)
Please reduce the lantus to 60 units each morning.   On this type of insulin schedule, you should eat meals on a regular schedule.  If a meal is missed or significantly delayed, your blood sugar could go low.  check your blood sugar twice a day.  vary the time of day when you check, between before the 3 meals, and at bedtime.  also check if you have symptoms of your blood sugar being too high or too low.  please keep a record of the readings and bring it to your next appointment here.  You can write it on any piece of paper.  please call us sooner if your blood sugar goes below 70, or if you have a lot of readings over 200.   Please come back for a follow-up appointment in 4 months.

## 2016-06-16 NOTE — Progress Notes (Signed)
Subjective:    Patient ID: Marcus Weaver, male    DOB: 1930-06-29, 83 y.o.   MRN: [id number]  HPI Pt returns for f/u of diabetes mellitus: DM type: Insulin-requiring type 2 Dx'ed: 5427 Complications: renal insufficiency and neuropathy.  Therapy: insulin since 2014 (and metformin).   DKA: never.   Severe hypoglycemia: twice (2011 and 2017).   Pancreatitis: never.   Other: he was changed to QD insulin, after poor results with bid premixed insulin, and also with multiple daily injections.  Interval history: no cbg record, but states cbg's vary from 50-200.  It is in general higher as the day goes on.   Past Medical History:  Diagnosis Date  . Abnormal EKG 08/14/2015  . CKD (chronic kidney disease), stage II   . Diabetes mellitus    type2  . Hyperlipidemia   . Hypertension   . Hypertensive heart disease 08/14/2015  . Tachycardia    a. HR 132 at endo OV 06/2015, no EKG at that time.  Marland Kitchen UTI (lower urinary tract infection)     Past Surgical History:  Procedure Laterality Date  . CATARACT EXTRACTION      Social History   Social History  . Marital status: Single    Spouse name: N/A  . Number of children: N/A  . Years of education: N/A   Occupational History  . retired    Social History Main Topics  . Smoking status: Former Smoker    Types: Cigarettes    Quit date: 06/25/2015  . Smokeless tobacco: Never Used     Comment: smokes 3 or 4 cigarettes /day  . Alcohol use Yes     Comment: occassional  . Drug use: No  . Sexual activity: Not on file   Other Topics Concern  . Not on file   Social History Narrative  . No narrative on file    Current Outpatient Prescriptions on File Prior to Visit  Medication Sig Dispense Refill  . amLODipine (NORVASC) 5 MG tablet TAKE 1 TABLET(5 MG) BY MOUTH DAILY 30 tablet 6  . aspirin EC 81 MG tablet Take 81 mg by mouth daily.    . carvedilol (COREG) 6.25 MG tablet Take 1 tablet (6.25 mg total) by mouth 2 (two) times daily. 60 tablet 11    . lisinopril (PRINIVIL,ZESTRIL) 20 MG tablet TAKE 1 TABLET(20 MG) BY MOUTH DAILY 30 tablet 6  . QUEtiapine (SEROQUEL) 25 MG tablet Take 1 tablet (25 mg total) by mouth at bedtime. 90 tablet 1   No current facility-administered medications on file prior to visit.     No Known Allergies  Family History  Problem Relation Age of Onset  . Diabetes Other     1st degree relative  . Hypertension Other   . Cancer Neg Hx    BP 136/72   Pulse 75   Ht 5\' 7"  (1.702 m)   Wt 215 lb (97.5 kg)   SpO2 97%   BMI 33.67 kg/m   Review of Systems Denies LOC    Objective:   Physical Exam VITAL SIGNS:  See vs page GENERAL: no distress Pulses: dorsalis pedis intact bilat.   MSK: no deformity of the feet CV: 2+ bilat leg edema Skin:  no ulcer on the feet, but the skin is dry.  normal color and temp on the feet.   Neuro: sensation is intact to touch on the feet.   Ext: There is bilateral onychomycosis of the toenails.   a1c=7.3%    Assessment &  Plan:  Insulin-requiring type 2 DM, with polyneuropathy, overcontrolled renal insufficiency: in this setting, he is at risk for recurrent hypoglycemia. Severe hypoglycemia: in view of this and advanced age, he is not a candidate for aggressive glycemic control.   Patient Instructions  Please reduce the lantus to 60 units each morning.   On this type of insulin schedule, you should eat meals on a regular schedule.  If a meal is missed or significantly delayed, your blood sugar could go low.  check your blood sugar twice a day.  vary the time of day when you check, between before the 3 meals, and at bedtime.  also check if you have symptoms of your blood sugar being too high or too low.  please keep a record of the readings and bring it to your next appointment here.  You can write it on any piece of paper.  please call us sooner if your blood sugar goes below 70, or if you have a lot of readings over 200.   Please come back for a follow-up appointment in  4 months.

## 2016-06-30 ENCOUNTER — Ambulatory Visit (INDEPENDENT_AMBULATORY_CARE_PROVIDER_SITE_OTHER): Payer: PPO | Admitting: Podiatry

## 2016-06-30 ENCOUNTER — Encounter: Payer: Self-pay | Admitting: Podiatry

## 2016-06-30 DIAGNOSIS — M79675 Pain in left toe(s): Secondary | ICD-10-CM | POA: Diagnosis not present

## 2016-06-30 DIAGNOSIS — E1151 Type 2 diabetes mellitus with diabetic peripheral angiopathy without gangrene: Secondary | ICD-10-CM

## 2016-06-30 DIAGNOSIS — B351 Tinea unguium: Secondary | ICD-10-CM | POA: Diagnosis not present

## 2016-06-30 DIAGNOSIS — M79674 Pain in right toe(s): Secondary | ICD-10-CM

## 2016-06-30 NOTE — Patient Instructions (Signed)

## 2016-06-30 NOTE — Progress Notes (Signed)
Patient ID: Marcus Weaver, male   DOB: [birth year], 83 y.o.   MRN: [id number]    Subjective: This patient presents today with his daughter treatment room who is requesting debridement of her others thickened and elongated and painful toenails.  Patient is diabetic without history of amputation or claudication  Objective: Patient appears confused  Vascular: Pitting edema bilaterally Trace palpable DP and PT pulses bilaterally Capillary reflex delay bilaterally  Neurological: Patient has difficulty responding to 10 g monofilament wire and vibratory sensation Ankle reflex equal and reactive bilaterally  Dermatological: No open skin lesions bilaterally Atrophic skin without hair growth bilaterally The toenails are extremely elongated, hypertrophic, deformed, discolored and tender to direct palpation 6-10  Musculoskeletal: HAV deformities bilaterally Hammertoe second left Walks slowly with walker  Assessment: Type II diabetic Decrease pedal pulses bilaterally, diabetic angiopathy Symptomatic onychomycoses 6-10  Plan: Debridement of toenails 6-10 mechanically and likely without any bleeding  Reappoint intervals recommended at 3 months

## 2016-07-07 DIAGNOSIS — H2522 Age-related cataract, morgagnian type, left eye: Secondary | ICD-10-CM | POA: Diagnosis not present

## 2016-07-07 DIAGNOSIS — E113412 Type 2 diabetes mellitus with severe nonproliferative diabetic retinopathy with macular edema, left eye: Secondary | ICD-10-CM | POA: Diagnosis not present

## 2016-07-07 DIAGNOSIS — H3582 Retinal ischemia: Secondary | ICD-10-CM | POA: Diagnosis not present

## 2016-07-07 LAB — HM DIABETES EYE EXAM

## 2016-07-08 ENCOUNTER — Encounter: Payer: Self-pay | Admitting: Family Medicine

## 2016-07-08 ENCOUNTER — Ambulatory Visit (INDEPENDENT_AMBULATORY_CARE_PROVIDER_SITE_OTHER): Payer: PPO | Admitting: Family Medicine

## 2016-07-08 VITALS — BP 141/80 | HR 67 | Temp 98.1°F | Resp 17 | Ht 67.0 in | Wt 216.1 lb

## 2016-07-08 DIAGNOSIS — Z0000 Encounter for general adult medical examination without abnormal findings: Secondary | ICD-10-CM

## 2016-07-08 DIAGNOSIS — I10 Essential (primary) hypertension: Secondary | ICD-10-CM

## 2016-07-08 DIAGNOSIS — E785 Hyperlipidemia, unspecified: Secondary | ICD-10-CM

## 2016-07-08 DIAGNOSIS — E1169 Type 2 diabetes mellitus with other specified complication: Secondary | ICD-10-CM

## 2016-07-08 LAB — CBC WITH DIFFERENTIAL/PLATELET
BASOS PCT: 0.7 % (ref 0.0–3.0)
Basophils Absolute: 0.1 10*3/uL (ref 0.0–0.1)
EOS PCT: 6.8 % — AB (ref 0.0–5.0)
Eosinophils Absolute: 0.6 10*3/uL (ref 0.0–0.7)
HCT: 32.1 % — ABNORMAL LOW (ref 39.0–52.0)
HEMOGLOBIN: 10.8 g/dL — AB (ref 13.0–17.0)
LYMPHS ABS: 2.4 10*3/uL (ref 0.7–4.0)
Lymphocytes Relative: 26.2 % (ref 12.0–46.0)
MCHC: 33.6 g/dL (ref 30.0–36.0)
MCV: 89.4 fl (ref 78.0–100.0)
MONO ABS: 0.8 10*3/uL (ref 0.1–1.0)
MONOS PCT: 8.7 % (ref 3.0–12.0)
NEUTROS PCT: 57.6 % (ref 43.0–77.0)
Neutro Abs: 5.2 10*3/uL (ref 1.4–7.7)
Platelets: 198 10*3/uL (ref 150.0–400.0)
RBC: 3.59 Mil/uL — ABNORMAL LOW (ref 4.22–5.81)
RDW: 14 % (ref 11.5–15.5)
WBC: 9.1 10*3/uL (ref 4.0–10.5)

## 2016-07-08 LAB — HEPATIC FUNCTION PANEL
ALT: 13 U/L (ref 0–53)
AST: 15 U/L (ref 0–37)
Albumin: 2.8 g/dL — ABNORMAL LOW (ref 3.5–5.2)
Alkaline Phosphatase: 64 U/L (ref 39–117)
Bilirubin, Direct: 0 mg/dL (ref 0.0–0.3)
Total Bilirubin: 0.3 mg/dL (ref 0.2–1.2)
Total Protein: 5.7 g/dL — ABNORMAL LOW (ref 6.0–8.3)

## 2016-07-08 LAB — LIPID PANEL
CHOL/HDL RATIO: 3
CHOLESTEROL: 122 mg/dL (ref 0–200)
HDL: 38 mg/dL — ABNORMAL LOW (ref >39.00)
LDL Cholesterol: 68 mg/dL (ref 0–99)
NONHDL: 84.29
TRIGLYCERIDES: 79 mg/dL (ref 0.0–149.0)
VLDL: 15.8 mg/dL (ref 0.0–40.0)

## 2016-07-08 LAB — BASIC METABOLIC PANEL WITH GFR
BUN: 25 mg/dL — ABNORMAL HIGH (ref 6–23)
CO2: 28 meq/L (ref 19–32)
Calcium: 8.4 mg/dL (ref 8.4–10.5)
Chloride: 111 meq/L (ref 96–112)
Creatinine, Ser: 2.16 mg/dL — ABNORMAL HIGH (ref 0.40–1.50)
GFR: 37.63 mL/min — ABNORMAL LOW
Glucose, Bld: 172 mg/dL — ABNORMAL HIGH (ref 70–99)
Potassium: 4.5 meq/L (ref 3.5–5.1)
Sodium: 144 meq/L (ref 135–145)

## 2016-07-08 LAB — TSH: TSH: 2.32 u[IU]/mL (ref 0.35–4.50)

## 2016-07-08 NOTE — Assessment & Plan Note (Addendum)
Chronic problem.  Pt has hx of difficult to control BP.  BP came down considerably after pt sat for awhile.  No med changes at this time.  Asymptomatic.  Check labs.

## 2016-07-08 NOTE — Progress Notes (Signed)
   Subjective:    Patient ID: Marcus Weaver, male    DOB: December 01, 1930, 83 y.o.   MRN: [id number]  HPI CPE- no need for colonoscopy due to age.  UTD on immunizations.  UTD on eye exam, foot exam.  On ACE for renal protection.  Seeing ENDO for diabetes.  Due for appt w/ Dr Karsten Ro.     Review of Systems Patient reports no vision/hearing changes, anorexia, fever ,adenopathy, persistant/recurrent hoarseness, swallowing issues, chest pain, palpitations, edema, persistant/recurrent cough, hemoptysis, dyspnea (rest,exertional, paroxysmal nocturnal), gastrointestinal  bleeding (melena, rectal bleeding), abdominal pain, excessive heart burn, GU symptoms (dysuria, hematuria, voiding/incontinence issues) syncope, focal weakness, memory loss, numbness & tingling, skin/hair/nail changes, depression, anxiety, abnormal bruising/bleeding, musculoskeletal symptoms/signs.     Objective:   Physical Exam General Appearance:    Alert, cooperative, no distress, appears stated age  Head:    Normocephalic, without obvious abnormality, atraumatic  Eyes:    Cataracts in both eyes      Ears:    Normal TM's and external ear canals, both ears  Nose:   Nares normal, septum midline, mucosa normal, no drainage   or sinus tenderness  Throat:   Lips, mucosa, and tongue normal; teeth and gums normal  Neck:   Supple, symmetrical, trachea midline, no adenopathy;       thyroid:  No enlargement/tenderness/nodules  Back:     Symmetric, no curvature, ROM normal, no CVA tenderness  Lungs:     Clear to auscultation bilaterally, respirations unlabored  Chest wall:    No tenderness or deformity  Heart:    Regular rate and rhythm, S1 and S2 normal, no murmur, rub   or gallop  Abdomen:     Soft, non-tender, bowel sounds active all four quadrants,    no masses, no organomegaly  Genitalia:    Deferred to urology  Rectal:    Extremities:   Extremities normal, atraumatic, no cyanosis or edema  Pulses:   2+ and symmetric all extremities    Skin:   Skin color, texture, turgor normal, no rashes or lesions  Lymph nodes:   Cervical, supraclavicular, and axillary nodes normal  Neurologic:   CNII-XII intact. Normal strength, sensation intact.  Difficulties w/ balance          Assessment & Plan:

## 2016-07-08 NOTE — Assessment & Plan Note (Signed)
Pt's PE unchanged from previous.  Known cataracts and difficulties w/ balance.  UTD on immunizations.  Due for f/u w/ Dr Karsten Ro (urology).  Written screening schedule updated and given to pt.  Check labs.  Anticipatory guidance provided.

## 2016-07-08 NOTE — Progress Notes (Signed)
Pre visit review using our clinic review tool, if applicable. No additional management support is needed unless otherwise documented below in the visit note. 

## 2016-07-08 NOTE — Assessment & Plan Note (Signed)
Chronic problem.  Not currently on medication.  Check labs.  Start meds prn.

## 2016-07-08 NOTE — Patient Instructions (Signed)
Schedule your Wellness visit w/ Kim at your convenience Follow up with me in 6 months to recheck BP and cholesterol We'll notify you of your lab results and make any changes if needed Call Dr Karsten Ro and schedule your follow up appt for the prostate Call with any questions or concerns [place]!!!

## 2016-07-13 ENCOUNTER — Other Ambulatory Visit: Payer: Self-pay | Admitting: Family Medicine

## 2016-07-13 DIAGNOSIS — R7989 Other specified abnormal findings of blood chemistry: Secondary | ICD-10-CM

## 2016-07-13 DIAGNOSIS — D582 Other hemoglobinopathies: Secondary | ICD-10-CM

## 2016-07-15 ENCOUNTER — Other Ambulatory Visit: Payer: Self-pay | Admitting: Family Medicine

## 2016-07-21 ENCOUNTER — Other Ambulatory Visit (INDEPENDENT_AMBULATORY_CARE_PROVIDER_SITE_OTHER): Payer: PPO

## 2016-07-21 DIAGNOSIS — R7989 Other specified abnormal findings of blood chemistry: Secondary | ICD-10-CM | POA: Diagnosis not present

## 2016-07-21 LAB — BASIC METABOLIC PANEL
BUN: 25 mg/dL — AB (ref 6–23)
CO2: 28 mEq/L (ref 19–32)
Calcium: 8.8 mg/dL (ref 8.4–10.5)
Chloride: 110 mEq/L (ref 96–112)
Creatinine, Ser: 2.26 mg/dL — ABNORMAL HIGH (ref 0.40–1.50)
GFR: 35.71 mL/min — AB (ref >60.00)
GLUCOSE: 188 mg/dL — AB (ref 70–99)
Potassium: 4.5 mEq/L (ref 3.5–5.1)
Sodium: 142 mEq/L (ref 135–145)

## 2016-07-22 ENCOUNTER — Other Ambulatory Visit: Payer: Self-pay | Admitting: Family Medicine

## 2016-07-22 DIAGNOSIS — R7989 Other specified abnormal findings of blood chemistry: Secondary | ICD-10-CM

## 2016-08-03 ENCOUNTER — Encounter: Payer: Self-pay | Admitting: General Practice

## 2016-08-09 DIAGNOSIS — E785 Hyperlipidemia, unspecified: Secondary | ICD-10-CM | POA: Diagnosis not present

## 2016-08-09 DIAGNOSIS — I129 Hypertensive chronic kidney disease with stage 1 through stage 4 chronic kidney disease, or unspecified chronic kidney disease: Secondary | ICD-10-CM | POA: Diagnosis not present

## 2016-08-09 DIAGNOSIS — I739 Peripheral vascular disease, unspecified: Secondary | ICD-10-CM | POA: Diagnosis not present

## 2016-08-09 DIAGNOSIS — Z720 Tobacco use: Secondary | ICD-10-CM | POA: Diagnosis not present

## 2016-08-09 DIAGNOSIS — Z6833 Body mass index (BMI) 33.0-33.9, adult: Secondary | ICD-10-CM | POA: Diagnosis not present

## 2016-08-09 DIAGNOSIS — D631 Anemia in chronic kidney disease: Secondary | ICD-10-CM | POA: Diagnosis not present

## 2016-08-09 DIAGNOSIS — E1129 Type 2 diabetes mellitus with other diabetic kidney complication: Secondary | ICD-10-CM | POA: Diagnosis not present

## 2016-08-09 DIAGNOSIS — N183 Chronic kidney disease, stage 3 (moderate): Secondary | ICD-10-CM | POA: Diagnosis not present

## 2016-08-09 DIAGNOSIS — N2581 Secondary hyperparathyroidism of renal origin: Secondary | ICD-10-CM | POA: Diagnosis not present

## 2016-08-11 ENCOUNTER — Other Ambulatory Visit: Payer: Self-pay | Admitting: Nephrology

## 2016-08-11 DIAGNOSIS — N183 Chronic kidney disease, stage 3 unspecified: Secondary | ICD-10-CM

## 2016-08-12 ENCOUNTER — Other Ambulatory Visit: Payer: Self-pay | Admitting: Nephrology

## 2016-08-12 DIAGNOSIS — N183 Chronic kidney disease, stage 3 unspecified: Secondary | ICD-10-CM

## 2016-08-17 DIAGNOSIS — N183 Chronic kidney disease, stage 3 (moderate): Secondary | ICD-10-CM | POA: Diagnosis not present

## 2016-08-24 DIAGNOSIS — N183 Chronic kidney disease, stage 3 (moderate): Secondary | ICD-10-CM | POA: Diagnosis not present

## 2016-08-24 DIAGNOSIS — I129 Hypertensive chronic kidney disease with stage 1 through stage 4 chronic kidney disease, or unspecified chronic kidney disease: Secondary | ICD-10-CM | POA: Diagnosis not present

## 2016-08-26 ENCOUNTER — Ambulatory Visit
Admission: RE | Admit: 2016-08-26 | Discharge: 2016-08-26 | Disposition: A | Discharge status: Home / Self Care | Payer: PPO | Source: Ambulatory Visit | Attending: Nephrology | Admitting: Nephrology

## 2016-08-26 DIAGNOSIS — N183 Chronic kidney disease, stage 3 unspecified: Secondary | ICD-10-CM

## 2016-09-07 DIAGNOSIS — I739 Peripheral vascular disease, unspecified: Secondary | ICD-10-CM | POA: Diagnosis not present

## 2016-09-07 DIAGNOSIS — E785 Hyperlipidemia, unspecified: Secondary | ICD-10-CM | POA: Diagnosis not present

## 2016-09-07 DIAGNOSIS — F0390 Unspecified dementia without behavioral disturbance: Secondary | ICD-10-CM | POA: Diagnosis not present

## 2016-09-07 DIAGNOSIS — E1129 Type 2 diabetes mellitus with other diabetic kidney complication: Secondary | ICD-10-CM | POA: Diagnosis not present

## 2016-09-07 DIAGNOSIS — N2581 Secondary hyperparathyroidism of renal origin: Secondary | ICD-10-CM | POA: Diagnosis not present

## 2016-09-07 DIAGNOSIS — I129 Hypertensive chronic kidney disease with stage 1 through stage 4 chronic kidney disease, or unspecified chronic kidney disease: Secondary | ICD-10-CM | POA: Diagnosis not present

## 2016-09-07 DIAGNOSIS — Z6833 Body mass index (BMI) 33.0-33.9, adult: Secondary | ICD-10-CM | POA: Diagnosis not present

## 2016-09-07 DIAGNOSIS — Z720 Tobacco use: Secondary | ICD-10-CM | POA: Diagnosis not present

## 2016-09-07 DIAGNOSIS — N183 Chronic kidney disease, stage 3 (moderate): Secondary | ICD-10-CM | POA: Diagnosis not present

## 2016-09-07 DIAGNOSIS — D631 Anemia in chronic kidney disease: Secondary | ICD-10-CM | POA: Diagnosis not present

## 2016-09-14 DIAGNOSIS — H25012 Cortical age-related cataract, left eye: Secondary | ICD-10-CM | POA: Diagnosis not present

## 2016-09-14 DIAGNOSIS — H2512 Age-related nuclear cataract, left eye: Secondary | ICD-10-CM | POA: Diagnosis not present

## 2016-09-14 DIAGNOSIS — H02839 Dermatochalasis of unspecified eye, unspecified eyelid: Secondary | ICD-10-CM | POA: Diagnosis not present

## 2016-09-14 DIAGNOSIS — H18412 Arcus senilis, left eye: Secondary | ICD-10-CM | POA: Diagnosis not present

## 2016-09-23 DIAGNOSIS — I129 Hypertensive chronic kidney disease with stage 1 through stage 4 chronic kidney disease, or unspecified chronic kidney disease: Secondary | ICD-10-CM | POA: Diagnosis not present

## 2016-09-29 ENCOUNTER — Encounter: Payer: Self-pay | Admitting: Podiatry

## 2016-09-29 ENCOUNTER — Ambulatory Visit (INDEPENDENT_AMBULATORY_CARE_PROVIDER_SITE_OTHER): Payer: PPO | Admitting: Podiatry

## 2016-09-29 DIAGNOSIS — M79675 Pain in left toe(s): Secondary | ICD-10-CM | POA: Diagnosis not present

## 2016-09-29 DIAGNOSIS — M79674 Pain in right toe(s): Secondary | ICD-10-CM

## 2016-09-29 DIAGNOSIS — B351 Tinea unguium: Secondary | ICD-10-CM

## 2016-09-29 DIAGNOSIS — E1151 Type 2 diabetes mellitus with diabetic peripheral angiopathy without gangrene: Secondary | ICD-10-CM | POA: Diagnosis not present

## 2016-09-29 NOTE — Patient Instructions (Signed)

## 2016-09-29 NOTE — Progress Notes (Signed)
Patient ID: Shanta Dorvil, male   DOB: 1931-01-18, 83 y.o.   MRN: [id number]    Subjective: This patient presents today with his son treatment room who is requesting debridement of her others thickened and elongated and painful toenails.  Patient is diabetic without history of amputation or claudication  Objective: Patient appears confused  Vascular: Pitting edema bilaterally Trace palpable DP and PT pulses bilaterally Capillary reflex delay bilaterally  Neurological: Patient has difficulty responding to 10 g monofilament wire and vibratory sensation Ankle reflex equal and reactive bilaterally  Dermatological: No open skin lesions bilaterally Atrophic skin without hair growth bilaterally The toenails are extremely elongated, hypertrophic, deformed, discolored and tender to direct palpation 6-10  Musculoskeletal: HAV deformities bilaterally Hammertoe second left Walks slowly with walker  Assessment: Type II diabetic Decrease pedal pulses bilaterally, diabetic angiopathy (peripheral arterial disease) Symptomatic onychomycoses 6-10  Plan: Debridement of toenails 6-10 mechanically and likely without any bleeding  Reappoint intervals recommended at 3 months

## 2016-10-12 ENCOUNTER — Other Ambulatory Visit: Payer: Self-pay | Admitting: Family Medicine

## 2016-10-15 ENCOUNTER — Ambulatory Visit (INDEPENDENT_AMBULATORY_CARE_PROVIDER_SITE_OTHER): Payer: PPO | Admitting: Endocrinology

## 2016-10-15 ENCOUNTER — Encounter: Payer: Self-pay | Admitting: Endocrinology

## 2016-10-15 VITALS — BP 146/76 | HR 82 | Ht 67.0 in | Wt 214.0 lb

## 2016-10-15 DIAGNOSIS — Z794 Long term (current) use of insulin: Secondary | ICD-10-CM | POA: Diagnosis not present

## 2016-10-15 DIAGNOSIS — E1122 Type 2 diabetes mellitus with diabetic chronic kidney disease: Secondary | ICD-10-CM

## 2016-10-15 DIAGNOSIS — N183 Chronic kidney disease, stage 3 (moderate): Secondary | ICD-10-CM | POA: Diagnosis not present

## 2016-10-15 LAB — POCT GLYCOSYLATED HEMOGLOBIN (HGB A1C): HEMOGLOBIN A1C: 7.6

## 2016-10-15 MED ORDER — INSULIN DETEMIR 100 UNIT/ML FLEXPEN: 70.0000 [IU] | PEN_INJECTOR | SUBCUTANEOUS | 11 refills | Status: DC

## 2016-10-15 NOTE — Progress Notes (Signed)
Subjective:    Patient ID: Marcus Weaver, male    DOB: 03-06-1931, 83 y.o.   MRN: [id number]  HPI Pt returns for f/u of diabetes mellitus: DM type: Insulin-requiring type 2 Dx'ed: 3614 Complications: renal insufficiency and neuropathy.  Therapy: insulin since 2014 (and metformin).   DKA: never.   Severe hypoglycemia: twice (2011 and 2017).   Pancreatitis: never.   Other: he was changed to QD insulin, after poor results with bid premixed insulin, and also with multiple daily injections.  Interval history: no cbg record, but states cbg's vary from 40-200's.  It is in general higher as the day goes on.  He never misses the insulin Past Medical History:  Diagnosis Date  . Abnormal EKG 08/14/2015  . CKD (chronic kidney disease), stage II   . Diabetes mellitus    type2  . Hyperlipidemia   . Hypertension   . Hypertensive heart disease 08/14/2015  . Tachycardia    a. HR 132 at endo OV 06/2015, no EKG at that time.  Marland Kitchen UTI (lower urinary tract infection)     Past Surgical History:  Procedure Laterality Date  . CATARACT EXTRACTION      Social History   Social History  . Marital status: Single    Spouse name: N/A  . Number of children: N/A  . Years of education: N/A   Occupational History  . retired    Social History Main Topics  . Smoking status: Former Smoker    Types: Cigarettes    Quit date: 06/25/2015  . Smokeless tobacco: Never Used     Comment: smokes 3 or 4 cigarettes /day  . Alcohol use Yes     Comment: occassional  . Drug use: No  . Sexual activity: Not on file   Other Topics Concern  . Not on file   Social History Narrative  . No narrative on file    Current Outpatient Prescriptions on File Prior to Visit  Medication Sig Dispense Refill  . amLODipine (NORVASC) 5 MG tablet TAKE 1 TABLET(5 MG) BY MOUTH DAILY 30 tablet 6  . aspirin EC 81 MG tablet Take 81 mg by mouth daily.    . carvedilol (COREG) 6.25 MG tablet Take 1 tablet (6.25 mg total) by mouth 2 (two)  times daily. 60 tablet 11  . glucose blood (ONETOUCH VERIO) test strip 1 each by Other route 2 (two) times daily. And lancets 2/day 200 each 3  . QUEtiapine (SEROQUEL) 25 MG tablet TAKE 1 TABLET(25 MG) BY MOUTH AT BEDTIME 90 tablet 0  . lisinopril (PRINIVIL,ZESTRIL) 20 MG tablet TAKE 1 TABLET(20 MG) BY MOUTH DAILY (Patient not taking: Reported on 10/15/2016) 90 tablet 0   No current facility-administered medications on file prior to visit.     No Known Allergies  Family History  Problem Relation Age of Onset  . Diabetes Other        1st degree relative  . Hypertension Other   . Cancer Neg Hx     BP (!) 146/76   Pulse 82   Ht 5\' 7"  (1.702 m)   Wt 214 lb (97.1 kg)   SpO2 97%   BMI 33.52 kg/m   Review of Systems Denies LOC    Objective:   Physical Exam VITAL SIGNS:  See vs page GENERAL: no distress  Pulses: foot pulses are intact bilaterally.   MSK: no deformity of the feet or ankles.  CV: 1+ bilat edema of the legs.  Skin:  no ulcer on the  feet or ankles.  normal color and temp on the feet and ankles Neuro: sensation is intact to touch on the feet and ankles.   ext: There is bilateral onychomycosis of the toenails.   A1c=7.6%  Lab Results  Component Value Date   CREATININE 2.26 (H) 07/21/2016   BUN 25 (H) 07/21/2016   NA 142 07/21/2016   K 4.5 07/21/2016   CL 110 07/21/2016   CO2 28 07/21/2016      Assessment & Plan:  Insulin-requiring type 2 DM, with polyneuropathy, worse: The pattern of his cbg's indicates he needs a faster-acting qd insulin Renal failure: in this context; he may need a still faster-acting qd insulin.   Patient Instructions  Please change the lantus to levemir, 70 units each morning.   You can use up the lantus by taking 55 units each morning.  On this type of insulin schedule, you should eat meals on a regular schedule.  If a meal is missed or significantly delayed, your blood sugar could go low.  check your blood sugar twice a day.  vary  the time of day when you check, between before the 3 meals, and at bedtime.  also check if you have symptoms of your blood sugar being too high or too low.  please keep a record of the readings and bring it to your next appointment here.  You can write it on any piece of paper.  please call us sooner if your blood sugar goes below 70, or if you have a lot of readings over 200.   Please come back for a follow-up appointment in 3 months.

## 2016-10-15 NOTE — Progress Notes (Signed)
   Subjective:    Patient ID: Marcus Weaver, male    DOB: 1931-01-04, 83 y.o.   MRN: [id number]  HPI    Review of Systems     Objective:   Physical Exam        Assessment & Plan:

## 2016-10-15 NOTE — Patient Instructions (Addendum)
Please change the lantus to levemir, 70 units each morning.   You can use up the lantus by taking 55 units each morning.  On this type of insulin schedule, you should eat meals on a regular schedule.  If a meal is missed or significantly delayed, your blood sugar could go low.  check your blood sugar twice a day.  vary the time of day when you check, between before the 3 meals, and at bedtime.  also check if you have symptoms of your blood sugar being too high or too low.  please keep a record of the readings and bring it to your next appointment here.  You can write it on any piece of paper.  please call us sooner if your blood sugar goes below 70, or if you have a lot of readings over 200.   Please come back for a follow-up appointment in 3 months.

## 2016-10-27 DIAGNOSIS — N183 Chronic kidney disease, stage 3 (moderate): Secondary | ICD-10-CM | POA: Diagnosis not present

## 2016-10-27 DIAGNOSIS — I129 Hypertensive chronic kidney disease with stage 1 through stage 4 chronic kidney disease, or unspecified chronic kidney disease: Secondary | ICD-10-CM | POA: Diagnosis not present

## 2016-11-09 ENCOUNTER — Other Ambulatory Visit: Payer: Self-pay | Admitting: Cardiology

## 2016-11-15 DIAGNOSIS — H2513 Age-related nuclear cataract, bilateral: Secondary | ICD-10-CM | POA: Diagnosis not present

## 2016-11-15 DIAGNOSIS — Z961 Presence of intraocular lens: Secondary | ICD-10-CM | POA: Diagnosis not present

## 2016-11-15 DIAGNOSIS — H2512 Age-related nuclear cataract, left eye: Secondary | ICD-10-CM | POA: Diagnosis not present

## 2016-12-06 DIAGNOSIS — N183 Chronic kidney disease, stage 3 (moderate): Secondary | ICD-10-CM | POA: Diagnosis not present

## 2016-12-06 DIAGNOSIS — D631 Anemia in chronic kidney disease: Secondary | ICD-10-CM | POA: Diagnosis not present

## 2016-12-06 DIAGNOSIS — E1129 Type 2 diabetes mellitus with other diabetic kidney complication: Secondary | ICD-10-CM | POA: Diagnosis not present

## 2016-12-06 DIAGNOSIS — E785 Hyperlipidemia, unspecified: Secondary | ICD-10-CM | POA: Diagnosis not present

## 2016-12-06 DIAGNOSIS — I739 Peripheral vascular disease, unspecified: Secondary | ICD-10-CM | POA: Diagnosis not present

## 2016-12-06 DIAGNOSIS — Z6833 Body mass index (BMI) 33.0-33.9, adult: Secondary | ICD-10-CM | POA: Diagnosis not present

## 2016-12-06 DIAGNOSIS — N2581 Secondary hyperparathyroidism of renal origin: Secondary | ICD-10-CM | POA: Diagnosis not present

## 2016-12-06 DIAGNOSIS — Z23 Encounter for immunization: Secondary | ICD-10-CM | POA: Diagnosis not present

## 2016-12-06 DIAGNOSIS — I129 Hypertensive chronic kidney disease with stage 1 through stage 4 chronic kidney disease, or unspecified chronic kidney disease: Secondary | ICD-10-CM | POA: Diagnosis not present

## 2016-12-06 DIAGNOSIS — F0390 Unspecified dementia without behavioral disturbance: Secondary | ICD-10-CM | POA: Diagnosis not present

## 2016-12-17 ENCOUNTER — Other Ambulatory Visit: Payer: Self-pay

## 2016-12-17 ENCOUNTER — Telehealth: Payer: Self-pay | Admitting: Endocrinology

## 2016-12-17 NOTE — Telephone Encounter (Signed)
Patient's daughter needed to know if the office has a sample pin for the Insulin Detemir (LEVEMIR FLEXTOUCH) 100 UNIT/ML Pen? Call to advise at the number provided. Patient is unable to get medication from the pharmacy until next week.

## 2016-12-17 NOTE — Telephone Encounter (Signed)
Called back and spoke with patient's daughter. Patient's nephew will be here to pick up Levemir pens bc patient is in [place].

## 2016-12-20 ENCOUNTER — Other Ambulatory Visit: Payer: Self-pay | Admitting: Cardiology

## 2016-12-21 DIAGNOSIS — H40003 Preglaucoma, unspecified, bilateral: Secondary | ICD-10-CM | POA: Diagnosis not present

## 2016-12-22 NOTE — Telephone Encounter (Signed)
Please call office and schedule appointment for further refills [phone].. 2nd attempt

## 2016-12-27 DIAGNOSIS — N184 Chronic kidney disease, stage 4 (severe): Secondary | ICD-10-CM | POA: Diagnosis not present

## 2017-01-03 ENCOUNTER — Ambulatory Visit: Payer: PPO | Admitting: Podiatry

## 2017-01-03 DIAGNOSIS — H43393 Other vitreous opacities, bilateral: Secondary | ICD-10-CM | POA: Diagnosis not present

## 2017-01-05 NOTE — Progress Notes (Signed)
Subjective:   Marcus Weaver is a 83 y.o. male who presents for Medicare Annual/Subsequent preventive examination.  Review of Systems:  No ROS.  Medicare Wellness Visit. Additional risk factors are reflected in the social history.  Cardiac Risk Factors include: advanced age (>18men, >69 women);diabetes mellitus;dyslipidemia;hypertension;male gender;obesity (BMI >30kg/m2);sedentary lifestyle   Sleep patterns: Sleeps 8 hours, up to void > 5 times.  Home Safety/Smoke Alarms: Feels safe in home. Smoke alarms in place.  Living environment; residence and Firearm Safety: Lives with daughter [place] Safety/Bike Helmet: Wears seat belt.   Male:   CCS-Colonoscopy > 10 years. Pt reports normal.      PSA-  Lab Results  Component Value Date   PSA 5.63 (H) 07/09/2015   PSA 1.30 11/07/2004       Objective:    Vitals: BP 136/68 (BP Location: Left Arm, Patient Position: Sitting, Cuff Size: Normal)   Pulse 60   Temp 97.9 F (36.6 C) (Temporal)   Resp 18   Ht 5\' 7"  (1.702 m)   Wt 209 lb 6.4 oz (95 kg)   SpO2 97%   BMI 32.80 kg/m   Body mass index is 32.8 kg/m.  Tobacco History  Smoking Status  . Former Smoker  . Types: Cigarettes  . Quit date: 06/25/2015  Smokeless Tobacco  . Never Used    Comment: smokes 3 or 4 cigarettes /day     Counseling given: Not Answered   Past Medical History:  Diagnosis Date  . Abnormal EKG 08/14/2015  . CKD (chronic kidney disease), stage II   . Diabetes mellitus    type2  . Hyperlipidemia   . Hypertension   . Hypertensive heart disease 08/14/2015  . Tachycardia    a. HR 132 at endo OV 06/2015, no EKG at that time.  Marland Kitchen UTI (lower urinary tract infection)    Past Surgical History:  Procedure Laterality Date  . CATARACT EXTRACTION     Family History  Problem Relation Age of Onset  . Diabetes Other        1st degree relative  . Hypertension Other   . Kidney disease Mother   . Cancer Neg Hx    History  Sexual Activity  . Sexual  activity: Not on file    Outpatient Encounter Prescriptions as of 01/06/2017  Medication Sig  . amLODipine (NORVASC) 5 MG tablet TAKE 1 TABLET BY MOUTH DAILY  . aspirin EC 81 MG tablet Take 81 mg by mouth daily.  . carvedilol (COREG) 6.25 MG tablet Take 1 tablet (6.25 mg total) by mouth 2 (two) times daily. (Patient taking differently: Take 12.5 mg by mouth 2 (two) times daily. )  . furosemide (LASIX) 80 MG tablet Take 2 tablets in the morning and 1 tablet at noon  . glucose blood (ONETOUCH VERIO) test strip 1 each by Other route 2 (two) times daily. And lancets 2/day  . Insulin Detemir (LEVEMIR FLEXTOUCH) 100 UNIT/ML Pen Inject 70 Units into the skin every morning. And pen needles 2/day  . lisinopril (PRINIVIL,ZESTRIL) 40 MG tablet 1/2 tab daily  . QUEtiapine (SEROQUEL) 25 MG tablet TAKE 1 TABLET(25 MG) BY MOUTH AT BEDTIME  . [DISCONTINUED] lisinopril (PRINIVIL,ZESTRIL) 20 MG tablet TAKE 1 TABLET(20 MG) BY MOUTH DAILY (Patient not taking: Reported on 10/15/2016)   No facility-administered encounter medications on file as of 01/06/2017.     Activities of Daily Living In your present state of health, do you have any difficulty performing the following activities: 01/06/2017 07/08/2016  Hearing?  N Y  Comment - some diminished conversation tones  Vision? N Y  Comment - cataract needs removed  Difficulty concentrating or making decisions? N Y  Walking or climbing stairs? Y Y  Comment Walks with walker -  Dressing or bathing? N N  Doing errands, shopping? Tempie Donning  Preparing Food and eating ? N N  Using the Toilet? N N  In the past six months, have you accidently leaked urine? N Y  Do you have problems with loss of bowel control? N N  Managing your Medications? Y Y  Managing your Finances? Tempie Donning  Housekeeping or managing your Housekeeping? Tempie Donning  Some recent data might be hidden    Patient Care Team: [name], MD as PCP - General (Family Medicine) Gean Birchwood, DPM as  Consulting Physician (Podiatry) Renato Shin, MD as Consulting Physician (Endocrinology) Kathie Rhodes, MD as Consulting Physician (Urology) Sueanne Margarita, MD as Consulting Physician (Cardiology) Deterding, Jeneen Rinks, MD as Consulting Physician (Nephrology)   Assessment:    Physical assessment deferred to PCP.  Exercise Activities and Dietary recommendations Current Exercise Habits: The patient does not participate in regular exercise at present (may walk to mailbox), Exercise limited by: neurologic condition(s) Diet (meal preparation, eat out, water intake, caffeinated beverages, dairy products, fruits and vegetables): Drinks water.   Breakfast: Berniece Salines, eggs, grits; cereal; oatmeal Lunch: sandwich Dinner: protein and vegetables.    Daughter states blood sugars at home are "good".  Encouraged to maintain leg strength by continuing exercised provided by PT.   Goals    . patient states          Increase walking and balance.       Fall Risk Fall Risk  01/06/2017 07/08/2016 07/09/2015 01/02/2015 06/03/2014  Falls in the past year? No No Yes No No  Number falls in past yr: - - 1 - -  Injury with Fall? - - Yes - -  Comment - - Broken Hand - -   Depression Screen PHQ 2/9 Scores 01/06/2017 07/08/2016 07/09/2015 01/02/2015  PHQ - 2 Score 0 0 0 0  PHQ- 9 Score - 0 - -    Cognitive Function MMSE - Mini Mental State Exam 01/06/2017  Orientation to time 5  Orientation to Place 5  Registration 3  Attention/ Calculation 0  Recall 3  Language- name 2 objects 2  Language- repeat 1  Language- follow 3 step command 3  Language- read & follow direction 1  Write a sentence 0  Copy design 1  Total score 24        Immunization History  Administered Date(s) Administered  . Influenza,inj,Quad PF,6+ Mos 01/02/2015, 01/08/2016, 12/06/2016  . Pneumococcal Conjugate-13 06/03/2014  . Pneumococcal Polysaccharide-23 10/21/2003, 06/01/2013   Screening Tests Health Maintenance  Topic Date  Due  . TETANUS/TDAP  12/20/2017 (Originally 05/16/1949)  . HEMOGLOBIN A1C  04/17/2017  . OPHTHALMOLOGY EXAM  07/07/2017  . FOOT EXAM  10/15/2017  . INFLUENZA VACCINE  Completed  . PNA vac Low Risk Adult  Completed      Plan:    Shingles vaccine at pharmacy.   Bring a copy of your living will and/or healthcare power of attorney to your next office visit.  Start doing brain stimulating activities (puzzles, reading, adult coloring books, staying active) to keep memory sharp.    I have personally reviewed and noted the following in the patient's chart:   . Medical and social history . Use of alcohol, tobacco or illicit drugs  .  Current medications and supplements . Functional ability and status . Nutritional status . Physical activity . Advanced directives . List of other physicians . Hospitalizations, surgeries, and ER visits in previous 12 months . Vitals . Screenings to include cognitive, depression, and falls . Referrals and appointments  In addition, I have reviewed and discussed with patient certain preventive protocols, quality metrics, and best practice recommendations. A written personalized care plan for preventive services as well as general preventive health recommendations were provided to patient.     Gerilyn Nestle, RN  01/06/2017

## 2017-01-06 ENCOUNTER — Ambulatory Visit (INDEPENDENT_AMBULATORY_CARE_PROVIDER_SITE_OTHER): Payer: PPO | Admitting: Family Medicine

## 2017-01-06 ENCOUNTER — Encounter: Payer: Self-pay | Admitting: Family Medicine

## 2017-01-06 VITALS — BP 136/68 | HR 60 | Temp 97.9°F | Resp 18 | Ht 67.0 in | Wt 209.4 lb

## 2017-01-06 DIAGNOSIS — E785 Hyperlipidemia, unspecified: Secondary | ICD-10-CM | POA: Diagnosis not present

## 2017-01-06 DIAGNOSIS — E1169 Type 2 diabetes mellitus with other specified complication: Secondary | ICD-10-CM | POA: Diagnosis not present

## 2017-01-06 DIAGNOSIS — I10 Essential (primary) hypertension: Secondary | ICD-10-CM | POA: Diagnosis not present

## 2017-01-06 DIAGNOSIS — Z23 Encounter for immunization: Secondary | ICD-10-CM | POA: Diagnosis not present

## 2017-01-06 DIAGNOSIS — F0390 Unspecified dementia without behavioral disturbance: Secondary | ICD-10-CM | POA: Diagnosis not present

## 2017-01-06 DIAGNOSIS — Z0000 Encounter for general adult medical examination without abnormal findings: Secondary | ICD-10-CM | POA: Diagnosis not present

## 2017-01-06 LAB — CBC WITH DIFFERENTIAL/PLATELET
BASOS PCT: 0.5 % (ref 0.0–3.0)
Basophils Absolute: 0 10*3/uL (ref 0.0–0.1)
EOS ABS: 0.6 10*3/uL (ref 0.0–0.7)
EOS PCT: 8.7 % — AB (ref 0.0–5.0)
HCT: 32.2 % — ABNORMAL LOW (ref 39.0–52.0)
HEMOGLOBIN: 11 g/dL — AB (ref 13.0–17.0)
Lymphocytes Relative: 24.6 % (ref 12.0–46.0)
Lymphs Abs: 1.7 10*3/uL (ref 0.7–4.0)
MCHC: 34 g/dL (ref 30.0–36.0)
MCV: 90.1 fl (ref 78.0–100.0)
MONO ABS: 0.5 10*3/uL (ref 0.1–1.0)
Monocytes Relative: 7.7 % (ref 3.0–12.0)
Neutro Abs: 4.1 10*3/uL (ref 1.4–7.7)
Neutrophils Relative %: 58.5 % (ref 43.0–77.0)
PLATELETS: 207 10*3/uL (ref 150.0–400.0)
RBC: 3.57 Mil/uL — ABNORMAL LOW (ref 4.22–5.81)
RDW: 13.5 % (ref 11.5–15.5)
WBC: 7 10*3/uL (ref 4.0–10.5)

## 2017-01-06 LAB — LIPID PANEL
Cholesterol: 189 mg/dL (ref 0–200)
HDL: 32.3 mg/dL — AB (ref >39.00)
LDL Cholesterol: 126 mg/dL — ABNORMAL HIGH (ref 0–99)
NonHDL: 156.48
TRIGLYCERIDES: 153 mg/dL — AB (ref 0.0–149.0)
Total CHOL/HDL Ratio: 6
VLDL: 30.6 mg/dL (ref 0.0–40.0)

## 2017-01-06 LAB — HEPATIC FUNCTION PANEL
ALBUMIN: 3.8 g/dL (ref 3.5–5.2)
ALK PHOS: 66 U/L (ref 39–117)
ALT: 10 U/L (ref 0–53)
AST: 12 U/L (ref 0–37)
BILIRUBIN TOTAL: 0.3 mg/dL (ref 0.2–1.2)
Bilirubin, Direct: 0 mg/dL (ref 0.0–0.3)
Total Protein: 7.3 g/dL (ref 6.0–8.3)

## 2017-01-06 LAB — BASIC METABOLIC PANEL
BUN: 67 mg/dL — AB (ref 6–23)
CHLORIDE: 103 meq/L (ref 96–112)
CO2: 31 meq/L (ref 19–32)
CREATININE: 3.19 mg/dL — AB (ref 0.40–1.50)
Calcium: 9.6 mg/dL (ref 8.4–10.5)
GFR: 23.97 mL/min — ABNORMAL LOW (ref >60.00)
Glucose, Bld: 338 mg/dL — ABNORMAL HIGH (ref 70–99)
POTASSIUM: 4.5 meq/L (ref 3.5–5.1)
Sodium: 143 mEq/L (ref 135–145)

## 2017-01-06 LAB — TSH: TSH: 3.11 u[IU]/mL (ref 0.35–4.50)

## 2017-01-06 MED ORDER — ZOSTER VAC RECOMB ADJUVANTED 50 MCG/0.5ML IM SUSR: 0.5000 mL | Freq: Once | INTRAMUSCULAR | 1 refills | Status: AC

## 2017-01-06 NOTE — Assessment & Plan Note (Signed)
Chronic problem.  Best BP control pt has had in awhile.  Asymptomatic at this time.  Check labs.  No anticipated med changes.  Will continue to follow.

## 2017-01-06 NOTE — Patient Instructions (Addendum)
Schedule your complete physical with me in 6 months We'll notify you of your lab results and make any changes if needed Continue to work on healthy diet- you're doing great! Call with any questions or concerns Happy Fall!!!  Shingles vaccine at pharmacy.   Bring a copy of your living will and/or healthcare power of attorney to your next office visit.  Start doing brain stimulating activities (puzzles, reading, adult coloring books, staying active) to keep memory sharp.    Health Maintenance, Male A healthy lifestyle and preventive care is important for your health and wellness. Ask your health care provider about what schedule of regular examinations is right for you. What should I know about weight and diet? Eat a Healthy Diet  Eat plenty of vegetables, fruits, whole grains, low-fat dairy products, and lean protein.  Do not eat a lot of foods high in solid fats, added sugars, or salt.  Maintain a Healthy Weight Regular exercise can help you achieve or maintain a healthy weight. You should:  Do at least 150 minutes of exercise each week. The exercise should increase your heart rate and make you sweat (moderate-intensity exercise).  Do strength-training exercises at least twice a week.  Watch Your Levels of Cholesterol and Blood Lipids  Have your blood tested for lipids and cholesterol every 5 years starting at 81 years of age. If you are at high risk for heart disease, you should start having your blood tested when you are 81 years old. You may need to have your cholesterol levels checked more often if: ? Your lipid or cholesterol levels are high. ? You are older than 81 years of age. ? You are at high risk for heart disease.  What should I know about cancer screening? Many types of cancers can be detected early and may often be prevented. Lung Cancer  You should be screened every year for lung cancer if: ? You are a current smoker who has smoked for at least 30 years. ? You  are a former smoker who has quit within the past 15 years.  Talk to your health care provider about your screening options, when you should start screening, and how often you should be screened.  Colorectal Cancer  Routine colorectal cancer screening usually begins at 81 years of age and should be repeated every 5-10 years until you are 81 years old. You may need to be screened more often if early forms of precancerous polyps or small growths are found. Your health care provider may recommend screening at an earlier age if you have risk factors for colon cancer.  Your health care provider may recommend using home test kits to check for hidden blood in the stool.  A small camera at the end of a tube can be used to examine your colon (sigmoidoscopy or colonoscopy). This checks for the earliest forms of colorectal cancer.  Prostate and Testicular Cancer  Depending on your age and overall health, your health care provider may do certain tests to screen for prostate and testicular cancer.  Talk to your health care provider about any symptoms or concerns you have about testicular or prostate cancer.  Skin Cancer  Check your skin from head to toe regularly.  Tell your health care provider about any new moles or changes in moles, especially if: ? There is a change in a mole's size, shape, or color. ? You have a mole that is larger than a pencil eraser.  Always use sunscreen. Apply sunscreen liberally and  repeat throughout the day.  Protect yourself by wearing long sleeves, pants, a wide-brimmed hat, and sunglasses when outside.  What should I know about heart disease, diabetes, and high blood pressure?  If you are 70-23 years of age, have your blood pressure checked every 3-5 years. If you are 100 years of age or older, have your blood pressure checked every year. You should have your blood pressure measured twice-once when you are at a hospital or clinic, and once when you are not at a  hospital or clinic. Record the average of the two measurements. To check your blood pressure when you are not at a hospital or clinic, you can use: ? An automated blood pressure machine at a pharmacy. ? A home blood pressure monitor.  Talk to your health care provider about your target blood pressure.  If you are between 27-55 years old, ask your health care provider if you should take aspirin to prevent heart disease.  Have regular diabetes screenings by checking your fasting blood sugar level. ? If you are at a normal weight and have a low risk for diabetes, have this test once every three years after the age of 65. ? If you are overweight and have a high risk for diabetes, consider being tested at a younger age or more often.  A one-time screening for abdominal aortic aneurysm (AAA) by ultrasound is recommended for men aged 59-75 years who are current or former smokers. What should I know about preventing infection? Hepatitis B If you have a higher risk for hepatitis B, you should be screened for this virus. Talk with your health care provider to find out if you are at risk for hepatitis B infection. Hepatitis C Blood testing is recommended for:  Everyone born from [birth year] through 1963.  Anyone with known risk factors for hepatitis C.  Sexually Transmitted Diseases (STDs)  You should be screened each year for STDs including gonorrhea and chlamydia if: ? You are sexually active and are younger than 81 years of age. ? You are older than 81 years of age and your health care provider tells you that you are at risk for this type of infection. ? Your sexual activity has changed since you were last screened and you are at an increased risk for chlamydia or gonorrhea. Ask your health care provider if you are at risk.  Talk with your health care provider about whether you are at high risk of being infected with HIV. Your health care provider may recommend a prescription medicine to help prevent  HIV infection.  What else can I do?  Schedule regular health, dental, and eye exams.  Stay current with your vaccines (immunizations).  Do not use any tobacco products, such as cigarettes, chewing tobacco, and e-cigarettes. If you need help quitting, ask your health care provider.  Limit alcohol intake to no more than 2 drinks per day. One drink equals 12 ounces of beer, 5 ounces of wine, or 1 ounces of hard liquor.  Do not use street drugs.  Do not share needles.  Ask your health care provider for help if you need support or information about quitting drugs.  Tell your health care provider if you often feel depressed.  Tell your health care provider if you have ever been abused or do not feel safe at home. This information is not intended to replace advice given to you by your health care provider. Make sure you discuss any questions you have with your health care  provider. Document Released: 09/04/2007 Document Revised: 11/05/2015 Document Reviewed: 12/10/2014 Elsevier Interactive Patient Education  [employer].

## 2017-01-06 NOTE — Progress Notes (Signed)
   Subjective:    Patient ID: Marcus Weaver, male    DOB: [birth year], 83 y.o.   MRN: [id number]  HPI HTN- chronic problem, on Amlodipine, Coreg, Lasix, Lisinopril w/ adequate control today.  No CP, SOB, HAs.  Vision has improved since having cataract surgery.  No swelling of hands/feet.  Hyperlipidemia- chronic problem, pt was previously on Simvastatin but has stopped this.  Due for repeat labs today.  No abd pain, N/V/D.  Pt has lost 5 lbs since last  Visit.  Dementia- pt has MMSE of 24 based on Wellness Visit but RN agrees this was a generous score.  On Seroquel as pt has hx of wandering.  Pt has not had any recent episodes or behavioral disturbances.   Review of Systems For ROS see HPI     Objective:   Physical Exam  Constitutional: He appears well-developed and well-nourished. No distress.  HENT:  Head: Normocephalic and atraumatic.  Eyes: Pupils are equal, round, and reactive to light. Conjunctivae and EOM are normal.  Neck: Normal range of motion. Neck supple. No thyromegaly present.  Cardiovascular: Normal rate, regular rhythm, normal heart sounds and intact distal pulses.   No murmur heard. Pulmonary/Chest: Effort normal and breath sounds normal. No respiratory distress.  Abdominal: Soft. Bowel sounds are normal. He exhibits no distension.  Musculoskeletal: He exhibits no edema.  Lymphadenopathy:    He has no cervical adenopathy.  Neurological: He is alert. No cranial nerve deficit.  Oriented to person and place but not time  Skin: Skin is warm and dry.  Psychiatric: He has a normal mood and affect. His behavior is normal.  Vitals reviewed.         Assessment & Plan:  See problem based charting.  Reviewed Medicare Wellness Visit provided by RN and agree w/ note as written.  Annye Asa, MD

## 2017-01-06 NOTE — Assessment & Plan Note (Signed)
Chronic problem.  On Seroquel for previous behavioral disturbance but has not had any issues recently.  No med changes at this time.  Will follow.

## 2017-01-06 NOTE — Assessment & Plan Note (Signed)
Chronic problem.  Was previously on a statin but not currently and unclear why this fell off his med list.  But unless cholesterol is much higher than previously, I do not see a reason to restart statin in 83 yr old w/ dementia.  Will follow.

## 2017-01-07 ENCOUNTER — Encounter: Payer: Self-pay | Admitting: General Practice

## 2017-01-07 DIAGNOSIS — H3582 Retinal ischemia: Secondary | ICD-10-CM | POA: Diagnosis not present

## 2017-01-07 DIAGNOSIS — E113412 Type 2 diabetes mellitus with severe nonproliferative diabetic retinopathy with macular edema, left eye: Secondary | ICD-10-CM | POA: Diagnosis not present

## 2017-01-08 ENCOUNTER — Other Ambulatory Visit: Payer: Self-pay | Admitting: Family Medicine

## 2017-01-14 ENCOUNTER — Encounter: Payer: Self-pay | Admitting: Endocrinology

## 2017-01-14 ENCOUNTER — Ambulatory Visit (INDEPENDENT_AMBULATORY_CARE_PROVIDER_SITE_OTHER): Payer: PPO | Admitting: Endocrinology

## 2017-01-14 VITALS — BP 180/82 | HR 68 | Wt 210.8 lb

## 2017-01-14 DIAGNOSIS — Z794 Long term (current) use of insulin: Secondary | ICD-10-CM

## 2017-01-14 DIAGNOSIS — N183 Chronic kidney disease, stage 3 (moderate): Secondary | ICD-10-CM | POA: Diagnosis not present

## 2017-01-14 DIAGNOSIS — E1122 Type 2 diabetes mellitus with diabetic chronic kidney disease: Secondary | ICD-10-CM

## 2017-01-14 LAB — POCT GLYCOSYLATED HEMOGLOBIN (HGB A1C): HEMOGLOBIN A1C: 8.7

## 2017-01-14 NOTE — Progress Notes (Signed)
Subjective:    Patient ID: Marcus Weaver, male    DOB: April 21, 1930, 83 y.o.   MRN: [id number]  HPI Pt returns for f/u of diabetes mellitus: DM type: Insulin-requiring type 2 Dx'ed: 2009.  Complications: renal insufficiency and neuropathy.  Therapy: insulin since 2014 (and metformin).   DKA: never.   Severe hypoglycemia: twice (2011 and 2017).   Pancreatitis: never.   Other: he was changed to QD insulin, after poor results with bid premixed insulin, and also with multiple daily injections  Interval history: He has been on the levemir x 1 week.  no cbg record, but states cbg's are well-controlled.  He says he misses the insulin this am, though. pt states he feels well in general.   Past Medical History:  Diagnosis Date  . Abnormal EKG 08/14/2015  . CKD (chronic kidney disease), stage II   . Diabetes mellitus    type2  . Hyperlipidemia   . Hypertension   . Hypertensive heart disease 08/14/2015  . Tachycardia    a. HR 132 at endo OV 06/2015, no EKG at that time.  Marland Kitchen UTI (lower urinary tract infection)     Past Surgical History:  Procedure Laterality Date  . CATARACT EXTRACTION      Social History   Social History  . Marital status: Single    Spouse name: N/A  . Number of children: N/A  . Years of education: N/A   Occupational History  . retired    Social History Main Topics  . Smoking status: Former Smoker    Types: Cigarettes    Quit date: 06/25/2015  . Smokeless tobacco: Never Used     Comment: smokes 3 or 4 cigarettes /day  . Alcohol use No  . Drug use: No  . Sexual activity: Not on file   Other Topics Concern  . Not on file   Social History Narrative  . No narrative on file    Current Outpatient Prescriptions on File Prior to Visit  Medication Sig Dispense Refill  . amLODipine (NORVASC) 5 MG tablet TAKE 1 TABLET BY MOUTH DAILY 15 tablet 0  . aspirin EC 81 MG tablet Take 81 mg by mouth daily.    . carvedilol (COREG) 6.25 MG tablet Take 1 tablet (6.25 mg  total) by mouth 2 (two) times daily. (Patient taking differently: Take 12.5 mg by mouth 2 (two) times daily. ) 60 tablet 11  . furosemide (LASIX) 80 MG tablet Take 2 tablets in the morning and 1 tablet at noon  10  . glucose blood (ONETOUCH VERIO) test strip 1 each by Other route 2 (two) times daily. And lancets 2/day 200 each 3  . Insulin Detemir (LEVEMIR FLEXTOUCH) 100 UNIT/ML Pen Inject 70 Units into the skin every morning. And pen needles 2/day 30 mL 11  . lisinopril (PRINIVIL,ZESTRIL) 40 MG tablet 1/2 tab daily  5  . QUEtiapine (SEROQUEL) 25 MG tablet TAKE 1 TABLET(25 MG) BY MOUTH AT BEDTIME 90 tablet 0   No current facility-administered medications on file prior to visit.     No Known Allergies  Family History  Problem Relation Age of Onset  . Diabetes Other        1st degree relative  . Hypertension Other   . Kidney disease Mother   . Cancer Neg Hx     BP (!) 180/82   Pulse 68   Wt 210 lb 12.8 oz (95.6 kg)   SpO2 98%   BMI 33.02 kg/m    Review  of Systems He denies hypoglycemia.      Objective:   Physical Exam VITAL SIGNS:  See vs page.  GENERAL: no distress. Pulses: foot pulses are intact bilaterally.   MSK: no deformity of the feet or ankles.  CV: 1+ bilat edema of the legs.  Skin:  no ulcer on the feet or ankles, but the skin is dry.  normal color and temp on the feet and ankles.   Neuro: sensation is intact to touch on the feet and ankles.  Ext: There is bilateral onychomycosis of the toenails.    A1c=8.7%    Assessment & Plan:  Insulin-requiring type 2 DM, with renal insuff: worse.  Noncompliance with cbg recording and insulin.  We discussed risks.    Patient Instructions  Please continue the same levemir.  On this type of insulin schedule, you should eat meals on a regular schedule.  If a meal is missed or significantly delayed, your blood sugar could go low.  check your blood sugar twice a day.  vary the time of day when you check, between before the 3  meals, and at bedtime.  also check if you have symptoms of your blood sugar being too high or too low.  please keep a record of the readings and bring it to your next appointment here.  You can write it on any piece of paper.  please call us sooner if your blood sugar goes below 70, or if you have a lot of readings over 200.   Please come back for a follow-up appointment in 3 months.

## 2017-01-14 NOTE — Patient Instructions (Addendum)
Please continue the same levemir.  On this type of insulin schedule, you should eat meals on a regular schedule.  If a meal is missed or significantly delayed, your blood sugar could go low.  check your blood sugar twice a day.  vary the time of day when you check, between before the 3 meals, and at bedtime.  also check if you have symptoms of your blood sugar being too high or too low.  please keep a record of the readings and bring it to your next appointment here.  You can write it on any piece of paper.  please call us sooner if your blood sugar goes below 70, or if you have a lot of readings over 200.   Please come back for a follow-up appointment in 3 months.

## 2017-01-17 ENCOUNTER — Ambulatory Visit (INDEPENDENT_AMBULATORY_CARE_PROVIDER_SITE_OTHER): Payer: PPO | Admitting: Podiatry

## 2017-01-17 ENCOUNTER — Other Ambulatory Visit: Payer: Self-pay | Admitting: Cardiology

## 2017-01-17 ENCOUNTER — Encounter: Payer: Self-pay | Admitting: Podiatry

## 2017-01-17 DIAGNOSIS — B351 Tinea unguium: Secondary | ICD-10-CM | POA: Diagnosis not present

## 2017-01-17 DIAGNOSIS — M79674 Pain in right toe(s): Secondary | ICD-10-CM

## 2017-01-17 DIAGNOSIS — E1151 Type 2 diabetes mellitus with diabetic peripheral angiopathy without gangrene: Secondary | ICD-10-CM | POA: Diagnosis not present

## 2017-01-17 DIAGNOSIS — M79675 Pain in left toe(s): Secondary | ICD-10-CM | POA: Diagnosis not present

## 2017-01-17 NOTE — Progress Notes (Signed)
Patient ID: Marcus Weaver, male   DOB: 05/03/1930, 83 y.o.   MRN: [id number]    Subjective: This patient presents today with his granddaughter present in the treatment room who is requesting debridement of her others thickened and elongated and painful toenails.  Patient is diabetic without history of amputation or claudication  Objective: Patient appears confused  Vascular: Pitting edema bilaterally Trace palpable DP and PT pulses bilaterally Capillary reflex delay bilaterally  Neurological: Patient has difficulty responding to 10 g monofilament wire and vibratory sensation Ankle reflex equal and reactive bilaterally  Dermatological: No open skin lesions bilaterally Atrophic skin without hair growth bilaterally The toenails are extremely elongated, hypertrophic, deformed, discolored and tender to direct palpation 6-10  Musculoskeletal: HAV deformities bilaterally Hammertoe second left Walks slowly with walker  Assessment: Type II diabetic Decrease pedal pulses bilaterally, diabetic angiopathy (peripheral arterial disease) Symptomatic onychomycoses 6-10  Plan: Debridement of toenails 6-10 mechanically and likely without any bleeding  Reappoint intervals recommended at 3 months

## 2017-01-17 NOTE — Patient Instructions (Signed)

## 2017-01-28 DIAGNOSIS — H3582 Retinal ischemia: Secondary | ICD-10-CM | POA: Diagnosis not present

## 2017-01-28 DIAGNOSIS — E113412 Type 2 diabetes mellitus with severe nonproliferative diabetic retinopathy with macular edema, left eye: Secondary | ICD-10-CM | POA: Diagnosis not present

## 2017-02-25 DIAGNOSIS — E113412 Type 2 diabetes mellitus with severe nonproliferative diabetic retinopathy with macular edema, left eye: Secondary | ICD-10-CM | POA: Diagnosis not present

## 2017-02-25 DIAGNOSIS — H3582 Retinal ischemia: Secondary | ICD-10-CM | POA: Diagnosis not present

## 2017-03-24 DIAGNOSIS — D631 Anemia in chronic kidney disease: Secondary | ICD-10-CM | POA: Diagnosis not present

## 2017-03-24 DIAGNOSIS — E785 Hyperlipidemia, unspecified: Secondary | ICD-10-CM | POA: Diagnosis not present

## 2017-03-24 DIAGNOSIS — I739 Peripheral vascular disease, unspecified: Secondary | ICD-10-CM | POA: Diagnosis not present

## 2017-03-24 DIAGNOSIS — I129 Hypertensive chronic kidney disease with stage 1 through stage 4 chronic kidney disease, or unspecified chronic kidney disease: Secondary | ICD-10-CM | POA: Diagnosis not present

## 2017-03-24 DIAGNOSIS — F0390 Unspecified dementia without behavioral disturbance: Secondary | ICD-10-CM | POA: Diagnosis not present

## 2017-03-24 DIAGNOSIS — E1122 Type 2 diabetes mellitus with diabetic chronic kidney disease: Secondary | ICD-10-CM | POA: Diagnosis not present

## 2017-03-24 DIAGNOSIS — N2581 Secondary hyperparathyroidism of renal origin: Secondary | ICD-10-CM | POA: Diagnosis not present

## 2017-03-24 DIAGNOSIS — N184 Chronic kidney disease, stage 4 (severe): Secondary | ICD-10-CM | POA: Diagnosis not present

## 2017-03-25 DIAGNOSIS — H3582 Retinal ischemia: Secondary | ICD-10-CM | POA: Diagnosis not present

## 2017-03-25 DIAGNOSIS — E113412 Type 2 diabetes mellitus with severe nonproliferative diabetic retinopathy with macular edema, left eye: Secondary | ICD-10-CM | POA: Diagnosis not present

## 2017-04-11 ENCOUNTER — Other Ambulatory Visit: Payer: Self-pay | Admitting: Family Medicine

## 2017-04-18 ENCOUNTER — Encounter: Payer: Self-pay | Admitting: Podiatry

## 2017-04-18 ENCOUNTER — Ambulatory Visit: Payer: PPO | Admitting: Podiatry

## 2017-04-18 ENCOUNTER — Encounter: Payer: Self-pay | Admitting: Endocrinology

## 2017-04-18 ENCOUNTER — Ambulatory Visit: Payer: PPO | Admitting: Endocrinology

## 2017-04-18 VITALS — BP 160/62 | HR 91 | Wt 215.4 lb

## 2017-04-18 DIAGNOSIS — M79675 Pain in left toe(s): Secondary | ICD-10-CM

## 2017-04-18 DIAGNOSIS — B351 Tinea unguium: Secondary | ICD-10-CM | POA: Diagnosis not present

## 2017-04-18 DIAGNOSIS — E785 Hyperlipidemia, unspecified: Secondary | ICD-10-CM

## 2017-04-18 DIAGNOSIS — E1169 Type 2 diabetes mellitus with other specified complication: Secondary | ICD-10-CM | POA: Diagnosis not present

## 2017-04-18 DIAGNOSIS — M79674 Pain in right toe(s): Secondary | ICD-10-CM

## 2017-04-18 LAB — POCT GLYCOSYLATED HEMOGLOBIN (HGB A1C): HEMOGLOBIN A1C: 9.4

## 2017-04-18 MED ORDER — INSULIN ISOPHANE HUMAN 100 UNIT/ML KWIKPEN: 60.0000 [IU] | PEN_INJECTOR | SUBCUTANEOUS | 11 refills | Status: DC

## 2017-04-18 NOTE — Progress Notes (Signed)
Subjective:    Patient ID: Marcus Weaver, male    DOB: [birth year], 84 y.o.   MRN: [id number]  HPI Pt returns for f/u of diabetes mellitus: DM type: Insulin-requiring type 2 Dx'ed: 2009.  Complications: renal insufficiency, DR, and neuropathy.  Therapy: insulin since 2014 (and metformin).   DKA: never.   Severe hypoglycemia: twice (2011 and 2017).   Pancreatitis: never.   Other: he was changed to QD insulin, after poor results with bid premixed insulin, and also with multiple daily injections  Interval history: He brings a record of his cbg's which I have reviewed today.  It varies from 45-200's.  It is in general higher as the day goes on. pt states he feels well in general.   Past Medical History:  Diagnosis Date  . Abnormal EKG 08/14/2015  . CKD (chronic kidney disease), stage II   . Diabetes mellitus    type2  . Hyperlipidemia   . Hypertension   . Hypertensive heart disease 08/14/2015  . Tachycardia    a. HR 132 at endo OV 06/2015, no EKG at that time.  Marland Kitchen UTI (lower urinary tract infection)     Past Surgical History:  Procedure Laterality Date  . CATARACT EXTRACTION      Social History   Socioeconomic History  . Marital status: Single    Spouse name: Not on file  . Number of children: Not on file  . Years of education: Not on file  . Highest education level: Not on file  Social Needs  . Financial resource strain: Not on file  . Food insecurity - worry: Not on file  . Food insecurity - inability: Not on file  . Transportation needs - medical: Not on file  . Transportation needs - non-medical: Not on file  Occupational History  . Occupation: retired  Tobacco Use  . Smoking status: Former Smoker    Types: Cigarettes    Last attempt to quit: 06/25/2015    Years since quitting: 1.8  . Smokeless tobacco: Never Used  . Tobacco comment: smokes 3 or 4 cigarettes /day  Substance and Sexual Activity  . Alcohol use: No  . Drug use: No  . Sexual activity: Not on file    Other Topics Concern  . Not on file  Social History Narrative  . Not on file    Current Outpatient Medications on File Prior to Visit  Medication Sig Dispense Refill  . amLODipine (NORVASC) 5 MG tablet Take 1 tablet (5 mg total) by mouth daily. Please make an overdue appt with Dr. Radford Pax before anymore refills. 3rd and Final 15 tablet 0  . aspirin EC 81 MG tablet Take 81 mg by mouth daily.    . carvedilol (COREG) 6.25 MG tablet Take 1 tablet (6.25 mg total) by mouth 2 (two) times daily. (Patient taking differently: Take 12.5 mg by mouth 2 (two) times daily. ) 60 tablet 11  . furosemide (LASIX) 80 MG tablet Take 2 tablets in the morning and 1 tablet at noon  10  . glucose blood (ONETOUCH VERIO) test strip 1 each by Other route 2 (two) times daily. And lancets 2/day 200 each 3  . lisinopril (PRINIVIL,ZESTRIL) 40 MG tablet 1/2 tab daily  5  . QUEtiapine (SEROQUEL) 25 MG tablet TAKE 1 TABLET(25 MG) BY MOUTH AT BEDTIME 90 tablet 0   No current facility-administered medications on file prior to visit.     No Known Allergies  Family History  Problem Relation Age of Onset  .  Diabetes Other        1st degree relative  . Hypertension Other   . Kidney disease Mother   . Cancer Neg Hx     BP (!) 160/62 (BP Location: Left Arm, Patient Position: Sitting, Cuff Size: Normal)   Pulse 91   Wt 215 lb 6.4 oz (97.7 kg)   SpO2 96%   BMI 33.74 kg/m    Review of Systems He denies hypoglycemia    Objective:   Physical Exam VITAL SIGNS:  See vs page.  GENERAL: no distress. Pulses: foot pulses are intact bilaterally.   MSK: no deformity of the feet or ankles.  CV: trace bilat edema of the legs.  Skin:  no ulcer on the feet or ankles.  normal color and temp on the feet and ankles.   Neuro: sensation is intact to touch on the feet and ankles.  Ext: There is bilateral onychomycosis of the toenails.   Lab Results  Component Value Date   CREATININE 3.19 (H) 01/06/2017   BUN 67 (H) 01/06/2017    NA 143 01/06/2017   K 4.5 01/06/2017   CL 103 01/06/2017   CO2 31 01/06/2017   Lab Results  Component Value Date   HGBA1C 9.4 04/18/2017       Assessment & Plan:  Insulin-requiring type 2 DM, with polyneuropathy: Worse Renal failure: he needs a faster-acting qd insulin Vision loss: he needs pens rather than vials  Patient Instructions  Please change levemir to "NPH," 60 units each morning On this type of insulin schedule, you should eat meals on a regular schedule.  If a meal is missed or significantly delayed, your blood sugar could go low.  check your blood sugar twice a day.  vary the time of day when you check, between before the 3 meals, and at bedtime.  also check if you have symptoms of your blood sugar being too high or too low.  please keep a record of the readings and bring it to your next appointment here.  You can write it on any piece of paper.  please call us sooner if your blood sugar goes below 70, or if you have a lot of readings over 200.   Please come back for a follow-up appointment in 2 months.

## 2017-04-18 NOTE — Patient Instructions (Addendum)
Please change levemir to "NPH," 60 units each morning On this type of insulin schedule, you should eat meals on a regular schedule.  If a meal is missed or significantly delayed, your blood sugar could go low.  check your blood sugar twice a day.  vary the time of day when you check, between before the 3 meals, and at bedtime.  also check if you have symptoms of your blood sugar being too high or too low.  please keep a record of the readings and bring it to your next appointment here.  You can write it on any piece of paper.  please call us sooner if your blood sugar goes below 70, or if you have a lot of readings over 200.   Please come back for a follow-up appointment in 2 months.

## 2017-04-20 NOTE — Progress Notes (Signed)
Subjective:   Patient ID: Marcus Weaver, male   DOB: 84 y.o.   MRN: [id number]   HPI Patient presents with elongated nailbeds 1-5 both feet that are thick yellow brittle and painful when pressed   ROS      Objective:  Physical Exam  Thick yellow brittle nailbeds that are painful and patient has tried to trim himself     Assessment:  Mycotic nail infections with pain 1-5 both feet     Plan:  Debride painful nailbeds 1-5 both feet with no iatrogenic bleeding noted

## 2017-04-28 ENCOUNTER — Other Ambulatory Visit: Payer: Self-pay

## 2017-04-28 MED ORDER — INSULIN NPH (HUMAN) (ISOPHANE) 100 UNIT/ML ~~LOC~~ SUSP: SUBCUTANEOUS | 0 refills | Status: DC

## 2017-05-10 DIAGNOSIS — H2522 Age-related cataract, morgagnian type, left eye: Secondary | ICD-10-CM | POA: Diagnosis not present

## 2017-05-10 DIAGNOSIS — E113412 Type 2 diabetes mellitus with severe nonproliferative diabetic retinopathy with macular edema, left eye: Secondary | ICD-10-CM | POA: Diagnosis not present

## 2017-05-10 DIAGNOSIS — H3582 Retinal ischemia: Secondary | ICD-10-CM | POA: Diagnosis not present

## 2017-06-16 ENCOUNTER — Ambulatory Visit: Payer: PPO | Admitting: Endocrinology

## 2017-06-16 ENCOUNTER — Encounter: Payer: Self-pay | Admitting: Endocrinology

## 2017-06-16 VITALS — BP 162/60 | HR 68 | Wt 218.8 lb

## 2017-06-16 DIAGNOSIS — Z794 Long term (current) use of insulin: Secondary | ICD-10-CM

## 2017-06-16 DIAGNOSIS — E1122 Type 2 diabetes mellitus with diabetic chronic kidney disease: Secondary | ICD-10-CM | POA: Diagnosis not present

## 2017-06-16 DIAGNOSIS — N183 Chronic kidney disease, stage 3 unspecified: Secondary | ICD-10-CM

## 2017-06-16 LAB — POCT GLYCOSYLATED HEMOGLOBIN (HGB A1C): Hemoglobin A1C: 9

## 2017-06-16 MED ORDER — INSULIN ASPART PROT & ASPART (70-30 MIX) 100 UNIT/ML PEN: 60.0000 [IU] | PEN_INJECTOR | Freq: Every day | SUBCUTANEOUS | 11 refills | Status: DC

## 2017-06-16 NOTE — Patient Instructions (Addendum)
Please change levemir to "70/30," 60 units with breakfast On this type of insulin schedule, you should eat meals on a regular schedule.  If a meal is missed or significantly delayed, your blood sugar could go low.  check your blood sugar twice a day.  vary the time of day when you check, between before the 3 meals, and at bedtime.  also check if you have symptoms of your blood sugar being too high or too low.  please keep a record of the readings and bring it to your next appointment here.  You can write it on any piece of paper.  please call us sooner if your blood sugar goes below 70, or if you have a lot of readings over 200.   Please come back for a follow-up appointment in 2 months.

## 2017-06-16 NOTE — Progress Notes (Signed)
Subjective:    Patient ID: Marcus Weaver, male    DOB: 01/28/1931, 84 y.o.   MRN: [id number]  HPI Pt returns for f/u of diabetes mellitus: DM type: Insulin-requiring type 2 Dx'ed: 2009.  Complications: renal insufficiency, DR, and neuropathy.  Therapy: insulin since 2014 (and metformin).   DKA: never.   Severe hypoglycemia: twice (2011 and 2017).   Pancreatitis: never.   Other: he was changed to QD insulin, after poor results with bid premixed insulin, and also with multiple daily injections.  dtr draws up insulin ahead of time.   Interval history: He brings a record of his cbg's which I have reviewed today.  It varies from 64-200's.  It is in general higher as the day goes on. Pt says he never misses the insulin.  pt states he feels well in general.  Past Medical History:  Diagnosis Date  . Abnormal EKG 08/14/2015  . CKD (chronic kidney disease), stage II   . Diabetes mellitus    type2  . Hyperlipidemia   . Hypertension   . Hypertensive heart disease 08/14/2015  . Tachycardia    a. HR 132 at endo OV 06/2015, no EKG at that time.  Marland Kitchen UTI (lower urinary tract infection)     Past Surgical History:  Procedure Laterality Date  . CATARACT EXTRACTION      Social History   Socioeconomic History  . Marital status: Single    Spouse name: Not on file  . Number of children: Not on file  . Years of education: Not on file  . Highest education level: Not on file  Occupational History  . Occupation: retired  Scientific laboratory technician  . Financial resource strain: Not on file  . Food insecurity:    Worry: Not on file    Inability: Not on file  . Transportation needs:    Medical: Not on file    Non-medical: Not on file  Tobacco Use  . Smoking status: Former Smoker    Types: Cigarettes    Last attempt to quit: 06/25/2015    Years since quitting: 1.9  . Smokeless tobacco: Never Used  . Tobacco comment: smokes 3 or 4 cigarettes /day  Substance and Sexual Activity  . Alcohol use: No  . Drug  use: No  . Sexual activity: Not on file  Lifestyle  . Physical activity:    Days per week: Not on file    Minutes per session: Not on file  . Stress: Not on file  Relationships  . Social connections:    Talks on phone: Not on file    Gets together: Not on file    Attends religious service: Not on file    Active member of club or organization: Not on file    Attends meetings of clubs or organizations: Not on file    Relationship status: Not on file  . Intimate partner violence:    Fear of current or ex partner: Not on file    Emotionally abused: Not on file    Physically abused: Not on file    Forced sexual activity: Not on file  Other Topics Concern  . Not on file  Social History Narrative  . Not on file    Current Outpatient Medications on File Prior to Visit  Medication Sig Dispense Refill  . amLODipine (NORVASC) 5 MG tablet Take 1 tablet (5 mg total) by mouth daily. Please make an overdue appt with Dr. Radford Pax before anymore refills. 3rd and Final 15 tablet 0  .  aspirin EC 81 MG tablet Take 81 mg by mouth daily.    . carvedilol (COREG) 6.25 MG tablet Take 1 tablet (6.25 mg total) by mouth 2 (two) times daily. (Patient taking differently: Take 12.5 mg by mouth 2 (two) times daily. ) 60 tablet 11  . furosemide (LASIX) 80 MG tablet Take 2 tablets in the morning and 1 tablet at noon  10  . glucose blood (ONETOUCH VERIO) test strip 1 each by Other route 2 (two) times daily. And lancets 2/day 200 each 3  . insulin NPH Human (NOVOLIN N) 100 UNIT/ML injection Inject 60 Units into the skin every morning 20 mL 0  . lisinopril (PRINIVIL,ZESTRIL) 40 MG tablet 1/2 tab daily  5  . QUEtiapine (SEROQUEL) 25 MG tablet TAKE 1 TABLET(25 MG) BY MOUTH AT BEDTIME 90 tablet 0   No current facility-administered medications on file prior to visit.     No Known Allergies  Family History  Problem Relation Age of Onset  . Diabetes Other        1st degree relative  . Hypertension Other   . Kidney  disease Mother   . Cancer Neg Hx     BP (!) 162/60 (BP Location: Left Arm, Patient Position: Sitting, Cuff Size: Normal)   Pulse 68   Wt 218 lb 12.8 oz (99.2 kg)   SpO2 98%   BMI 34.27 kg/m    Review of Systems Denies LOC.     Objective:   Physical Exam VITAL SIGNS:  See vs page.  GENERAL: no distress. Pulses: foot pulses are intact bilaterally.   MSK: no deformity of the feet or ankles.  CV: trace bilat edema of the legs.  Skin:  no ulcer on the feet or ankles.  normal color and temp on the feet and ankles.   Neuro: sensation is intact to touch on the feet and ankles.  Ext: There is bilateral onychomycosis of the toenails.     Lab Results  Component Value Date   HGBA1C 9.0 06/16/2017      Assessment & Plan:  Insulin-requiring type 2 DM, with DR: she needs increased rx.  Renal failure: in this setting, he needs a faster-acting qd insulin. Hypoglycemia: this limits aggressiveness of glycemic control.   Patient Instructions  Please change levemir to "70/30," 60 units with breakfast On this type of insulin schedule, you should eat meals on a regular schedule.  If a meal is missed or significantly delayed, your blood sugar could go low.  check your blood sugar twice a day.  vary the time of day when you check, between before the 3 meals, and at bedtime.  also check if you have symptoms of your blood sugar being too high or too low.  please keep a record of the readings and bring it to your next appointment here.  You can write it on any piece of paper.  please call us sooner if your blood sugar goes below 70, or if you have a lot of readings over 200.   Please come back for a follow-up appointment in 2 months.

## 2017-06-21 DIAGNOSIS — H40002 Preglaucoma, unspecified, left eye: Secondary | ICD-10-CM | POA: Diagnosis not present

## 2017-06-21 DIAGNOSIS — E113412 Type 2 diabetes mellitus with severe nonproliferative diabetic retinopathy with macular edema, left eye: Secondary | ICD-10-CM | POA: Diagnosis not present

## 2017-06-21 DIAGNOSIS — Z961 Presence of intraocular lens: Secondary | ICD-10-CM | POA: Diagnosis not present

## 2017-06-21 DIAGNOSIS — H02839 Dermatochalasis of unspecified eye, unspecified eyelid: Secondary | ICD-10-CM | POA: Diagnosis not present

## 2017-06-21 DIAGNOSIS — H11152 Pinguecula, left eye: Secondary | ICD-10-CM | POA: Diagnosis not present

## 2017-06-21 DIAGNOSIS — H18412 Arcus senilis, left eye: Secondary | ICD-10-CM | POA: Diagnosis not present

## 2017-06-29 ENCOUNTER — Other Ambulatory Visit: Payer: Self-pay | Admitting: Endocrinology

## 2017-07-01 ENCOUNTER — Telehealth: Payer: Self-pay | Admitting: Endocrinology

## 2017-07-01 ENCOUNTER — Other Ambulatory Visit: Payer: Self-pay

## 2017-07-01 MED ORDER — GLUCOSE BLOOD VI STRP: 1.0000 | ORAL_STRIP | Freq: Two times a day (BID) | 3 refills | Status: DC

## 2017-07-01 NOTE — Telephone Encounter (Signed)
I have sent to patient;'s pharmacy.  

## 2017-07-01 NOTE — Telephone Encounter (Signed)
Patient requested a refill of test strips pharmacy have not received them, he do not have any more.  [facility] - [place], [place] [phone] (Phone) [phone] (Fax)

## 2017-07-06 DIAGNOSIS — E113412 Type 2 diabetes mellitus with severe nonproliferative diabetic retinopathy with macular edema, left eye: Secondary | ICD-10-CM | POA: Diagnosis not present

## 2017-07-06 DIAGNOSIS — H3582 Retinal ischemia: Secondary | ICD-10-CM | POA: Diagnosis not present

## 2017-07-14 ENCOUNTER — Other Ambulatory Visit: Payer: Self-pay | Admitting: Family Medicine

## 2017-07-18 ENCOUNTER — Encounter: Payer: Self-pay | Admitting: Podiatry

## 2017-07-18 ENCOUNTER — Ambulatory Visit: Payer: PPO | Admitting: Podiatry

## 2017-07-18 DIAGNOSIS — B351 Tinea unguium: Secondary | ICD-10-CM

## 2017-07-18 DIAGNOSIS — M79675 Pain in left toe(s): Secondary | ICD-10-CM | POA: Diagnosis not present

## 2017-07-18 DIAGNOSIS — M79674 Pain in right toe(s): Secondary | ICD-10-CM

## 2017-07-19 NOTE — Progress Notes (Signed)
Subjective:   Patient ID: Marcus Weaver, male   DOB: 84 y.o.   MRN: [id number]   HPIpatient presents with nail disease 1-5 both feet with poor health individual    ROS      Objective:  Physical Exam  Neurovascular status unchanged with thick yellow brittle nailbeds 1-5 both feet     Assessment:  Painful mycotic nail infection 1-5 both feet     Plan:  Debride painful nailbeds 1-5 both feet with no iatrogenic bleeding noted

## 2017-07-21 ENCOUNTER — Telehealth: Payer: Self-pay | Admitting: Family Medicine

## 2017-07-21 NOTE — Telephone Encounter (Signed)
Paperwork placed in basket to be sent to Dr. Cordelia Pen office.

## 2017-07-21 NOTE — Telephone Encounter (Signed)
Copied from [place] [id number]. Topic: Quick Communication - See Telephone Encounter >> Jul 21, 2017 10:33 AM Aurelio Brash B wrote: CRM for notification. See Telephone encounter for: 07/21/17. Carlos from [employer] asking if Fax for [employer]  was received  [place] contact [phone]

## 2017-07-21 NOTE — Telephone Encounter (Signed)
Paperwork given to PCP for completion.  

## 2017-07-21 NOTE — Telephone Encounter (Signed)
Pt sees ENDO- will forward paperwork to them for diabetic shoes

## 2017-07-21 NOTE — Telephone Encounter (Signed)
Faxed to [facility].

## 2017-07-21 NOTE — Telephone Encounter (Signed)
Paperwork received as documented on previous phone note. Sent to endocrinology for completion.

## 2017-07-21 NOTE — Telephone Encounter (Signed)
Received "Physician's Orders" paperwork, from [place], via fax. Requesting that forms be completed and faxed back. Placed in front bin with charge sheet.

## 2017-07-26 DIAGNOSIS — Z0000 Encounter for general adult medical examination without abnormal findings: Secondary | ICD-10-CM | POA: Diagnosis not present

## 2017-07-26 DIAGNOSIS — E785 Hyperlipidemia, unspecified: Secondary | ICD-10-CM | POA: Diagnosis not present

## 2017-07-26 DIAGNOSIS — F0390 Unspecified dementia without behavioral disturbance: Secondary | ICD-10-CM | POA: Diagnosis not present

## 2017-07-26 DIAGNOSIS — I129 Hypertensive chronic kidney disease with stage 1 through stage 4 chronic kidney disease, or unspecified chronic kidney disease: Secondary | ICD-10-CM | POA: Diagnosis not present

## 2017-07-26 DIAGNOSIS — E1122 Type 2 diabetes mellitus with diabetic chronic kidney disease: Secondary | ICD-10-CM | POA: Diagnosis not present

## 2017-07-26 DIAGNOSIS — N184 Chronic kidney disease, stage 4 (severe): Secondary | ICD-10-CM | POA: Diagnosis not present

## 2017-07-26 DIAGNOSIS — I739 Peripheral vascular disease, unspecified: Secondary | ICD-10-CM | POA: Diagnosis not present

## 2017-07-26 DIAGNOSIS — D631 Anemia in chronic kidney disease: Secondary | ICD-10-CM | POA: Diagnosis not present

## 2017-07-26 DIAGNOSIS — N2581 Secondary hyperparathyroidism of renal origin: Secondary | ICD-10-CM | POA: Diagnosis not present

## 2017-08-16 ENCOUNTER — Encounter: Payer: Self-pay | Admitting: Endocrinology

## 2017-08-16 ENCOUNTER — Ambulatory Visit (INDEPENDENT_AMBULATORY_CARE_PROVIDER_SITE_OTHER): Payer: PPO | Admitting: Endocrinology

## 2017-08-16 VITALS — BP 164/68 | HR 94 | Wt 216.2 lb

## 2017-08-16 DIAGNOSIS — N183 Chronic kidney disease, stage 3 unspecified: Secondary | ICD-10-CM

## 2017-08-16 DIAGNOSIS — E1122 Type 2 diabetes mellitus with diabetic chronic kidney disease: Secondary | ICD-10-CM | POA: Diagnosis not present

## 2017-08-16 DIAGNOSIS — Z794 Long term (current) use of insulin: Secondary | ICD-10-CM | POA: Diagnosis not present

## 2017-08-16 LAB — POCT GLYCOSYLATED HEMOGLOBIN (HGB A1C): Hemoglobin A1C: 8.6 % — AB (ref 4.0–5.6)

## 2017-08-16 MED ORDER — INSULIN ASPART PROT & ASPART (70-30 MIX) 100 UNIT/ML PEN: PEN_INJECTOR | SUBCUTANEOUS | 11 refills | Status: DC

## 2017-08-16 NOTE — Patient Instructions (Addendum)
Please change levemir to "70/30," 50 units with breakfast, and 10 units with supper.   On this type of insulin schedule, you should eat meals on a regular schedule.  If a meal is missed or significantly delayed, your blood sugar could go low.   check your blood sugar twice a day.  vary the time of day when you check, between before the 3 meals, and at bedtime.  also check if you have symptoms of your blood sugar being too high or too low.  please keep a record of the readings and bring it to your next appointment here.  You can write it on any piece of paper.  please call us sooner if your blood sugar goes below 70, or if you have a lot of readings over 200.   Please come back for a follow-up appointment in 2 months.

## 2017-08-16 NOTE — Progress Notes (Signed)
Subjective:    Patient ID: Marcus Weaver, male    DOB: 12/23/1930, 84 y.o.   MRN: [id number]  HPI Pt returns for f/u of diabetes mellitus: DM type: Insulin-requiring type 2 Dx'ed: 2009.  Complications: renal insufficiency, DR, and neuropathy.  Therapy: insulin since 2014 (and metformin).   DKA: never.   Severe hypoglycemia: twice (2011 and 2017).   Pancreatitis: never.   Other: he was changed to QD insulin, after poor results with multiple daily injections.  dtr draws up insulin ahead of time.   Interval history: He brings a record of his cbg's which I have reviewed today.  It varies from 30-200's.  It is in general lowest in the afternoon, but also highest then.  Pt says timing of lunch is variable.   Pt says he never misses the insulin.  pt states he feels well in general.   Past Medical History:  Diagnosis Date  . Abnormal EKG 08/14/2015  . CKD (chronic kidney disease), stage II   . Diabetes mellitus    type2  . Hyperlipidemia   . Hypertension   . Hypertensive heart disease 08/14/2015  . Tachycardia    a. HR 132 at endo OV 06/2015, no EKG at that time.  Marland Kitchen UTI (lower urinary tract infection)     Past Surgical History:  Procedure Laterality Date  . CATARACT EXTRACTION      Social History   Socioeconomic History  . Marital status: Single    Spouse name: Not on file  . Number of children: Not on file  . Years of education: Not on file  . Highest education level: Not on file  Occupational History  . Occupation: retired  Scientific laboratory technician  . Financial resource strain: Not on file  . Food insecurity:    Worry: Not on file    Inability: Not on file  . Transportation needs:    Medical: Not on file    Non-medical: Not on file  Tobacco Use  . Smoking status: Former Smoker    Types: Cigarettes    Last attempt to quit: 06/25/2015    Years since quitting: 2.1  . Smokeless tobacco: Never Used  . Tobacco comment: smokes 3 or 4 cigarettes /day  Substance and Sexual Activity  .  Alcohol use: No  . Drug use: No  . Sexual activity: Not on file  Lifestyle  . Physical activity:    Days per week: Not on file    Minutes per session: Not on file  . Stress: Not on file  Relationships  . Social connections:    Talks on phone: Not on file    Gets together: Not on file    Attends religious service: Not on file    Active member of club or organization: Not on file    Attends meetings of clubs or organizations: Not on file    Relationship status: Not on file  . Intimate partner violence:    Fear of current or ex partner: Not on file    Emotionally abused: Not on file    Physically abused: Not on file    Forced sexual activity: Not on file  Other Topics Concern  . Not on file  Social History Narrative  . Not on file    Current Outpatient Medications on File Prior to Visit  Medication Sig Dispense Refill  . amLODipine (NORVASC) 5 MG tablet Take 1 tablet (5 mg total) by mouth daily. Please make an overdue appt with Dr. Radford Pax before anymore  refills. 3rd and Final 15 tablet 0  . aspirin EC 81 MG tablet Take 81 mg by mouth daily.    . carvedilol (COREG) 6.25 MG tablet Take 1 tablet (6.25 mg total) by mouth 2 (two) times daily. (Patient taking differently: Take 12.5 mg by mouth 2 (two) times daily. ) 60 tablet 11  . furosemide (LASIX) 80 MG tablet Take 2 tablets in the morning and 1 tablet at noon  10  . glucose blood (ONETOUCH VERIO) test strip 1 each by Other route 2 (two) times daily. And lancets 2/day 200 each 3  . lisinopril (PRINIVIL,ZESTRIL) 40 MG tablet 1/2 tab daily  5  . ONETOUCH VERIO test strip USE TWICE DAILY 200 each 3  . QUEtiapine (SEROQUEL) 25 MG tablet TAKE 1 TABLET(25 MG) BY MOUTH AT BEDTIME 90 tablet 0   No current facility-administered medications on file prior to visit.     No Known Allergies  Family History  Problem Relation Age of Onset  . Diabetes Other        1st degree relative  . Hypertension Other   . Kidney disease Mother   .  Cancer Neg Hx     BP (!) 164/68   Pulse 94   Wt 216 lb 3.2 oz (98.1 kg)   SpO2 95%   BMI 33.86 kg/m    Review of Systems Denies LOC.      Objective:   Physical Exam VITAL SIGNS:  See vs page.  GENERAL: no distress. Pulses: foot pulses are intact bilaterally.   MSK: no deformity of the feet or ankles.  CV: trace bilat edema of the legs.  Skin:  no ulcer on the feet or ankles.  normal color and temp on the feet and ankles.   Neuro: sensation is intact to touch on the feet and ankles.  Ext: There is bilateral onychomycosis of the toenails.     Lab Results  Component Value Date   HGBA1C 8.6 (A) 08/16/2017   Lab Results  Component Value Date   CREATININE 3.19 (H) 01/06/2017   BUN 67 (H) 01/06/2017   NA 143 01/06/2017   K 4.5 01/06/2017   CL 103 01/06/2017   CO2 31 01/06/2017      Assessment & Plan:  Insulin-requiring type 2 DM, with renal insuff: The pattern of his cbg's indicates he needs some adjustment in his therapy  Patient Instructions  Please change levemir to "70/30," 50 units with breakfast, and 10 units with supper.   On this type of insulin schedule, you should eat meals on a regular schedule.  If a meal is missed or significantly delayed, your blood sugar could go low.   check your blood sugar twice a day.  vary the time of day when you check, between before the 3 meals, and at bedtime.  also check if you have symptoms of your blood sugar being too high or too low.  please keep a record of the readings and bring it to your next appointment here.  You can write it on any piece of paper.  please call us sooner if your blood sugar goes below 70, or if you have a lot of readings over 200.   Please come back for a follow-up appointment in 2 months.

## 2017-08-31 DIAGNOSIS — N2581 Secondary hyperparathyroidism of renal origin: Secondary | ICD-10-CM | POA: Diagnosis not present

## 2017-09-26 ENCOUNTER — Telehealth: Payer: Self-pay | Admitting: Emergency Medicine

## 2017-09-26 NOTE — Telephone Encounter (Signed)
Pts daughter called stating she went to pick up patients insulin. She was told it was going to be $90 but it was $300. She is wondering if there is something else she can do to get it cheaper. Please advise thanks.

## 2017-09-26 NOTE — Telephone Encounter (Signed)
Cheapest is to buy [facility] 70/30.  Same dosage.  No prescription needed, but we are happy to send rx.  Does pt need to see Vaughan Basta, to learn syringe and vial?

## 2017-09-28 MED ORDER — INSULIN DETEMIR 100 UNIT/ML FLEXPEN: 70.0000 [IU] | PEN_INJECTOR | SUBCUTANEOUS | 11 refills | Status: DC

## 2017-09-28 NOTE — Telephone Encounter (Signed)
Ok, I have sent a prescription to your pharmacy, to change back to levemir, 70 units qam.

## 2017-09-28 NOTE — Telephone Encounter (Signed)
I notified patient's daughter that prescription was sent in & changed.

## 2017-09-28 NOTE — Telephone Encounter (Signed)
I spoke with patient's daughter & she stated that dad did not want to switch to a vial. She stated that the Levemir he was taking was more affordable. She was wondering could he possibly switch back?

## 2017-09-28 NOTE — Addendum Note (Signed)
Addended by: Renato Shin on: 09/28/2017 03:48 PM   Modules accepted: Orders

## 2017-10-11 ENCOUNTER — Other Ambulatory Visit: Payer: Self-pay | Admitting: Family Medicine

## 2017-10-17 ENCOUNTER — Encounter: Payer: Self-pay | Admitting: Podiatry

## 2017-10-17 ENCOUNTER — Ambulatory Visit (INDEPENDENT_AMBULATORY_CARE_PROVIDER_SITE_OTHER): Payer: PPO | Admitting: Podiatry

## 2017-10-17 DIAGNOSIS — M79674 Pain in right toe(s): Secondary | ICD-10-CM

## 2017-10-17 DIAGNOSIS — E1151 Type 2 diabetes mellitus with diabetic peripheral angiopathy without gangrene: Secondary | ICD-10-CM

## 2017-10-17 DIAGNOSIS — B351 Tinea unguium: Secondary | ICD-10-CM

## 2017-10-17 DIAGNOSIS — M79675 Pain in left toe(s): Secondary | ICD-10-CM

## 2017-10-18 ENCOUNTER — Ambulatory Visit (INDEPENDENT_AMBULATORY_CARE_PROVIDER_SITE_OTHER): Payer: PPO | Admitting: Endocrinology

## 2017-10-18 ENCOUNTER — Encounter: Payer: Self-pay | Admitting: Endocrinology

## 2017-10-18 VITALS — BP 170/80 | HR 95 | Temp 98.2°F | Ht 67.0 in | Wt 218.0 lb

## 2017-10-18 DIAGNOSIS — N183 Chronic kidney disease, stage 3 unspecified: Secondary | ICD-10-CM

## 2017-10-18 DIAGNOSIS — Z794 Long term (current) use of insulin: Secondary | ICD-10-CM | POA: Diagnosis not present

## 2017-10-18 DIAGNOSIS — E1122 Type 2 diabetes mellitus with diabetic chronic kidney disease: Secondary | ICD-10-CM

## 2017-10-18 LAB — POCT GLYCOSYLATED HEMOGLOBIN (HGB A1C): Hemoglobin A1C: 8.1 % — AB (ref 4.0–5.6)

## 2017-10-18 MED ORDER — INSULIN NPH ISOPHANE & REGULAR (70-30) 100 UNIT/ML ~~LOC~~ SUSP: 55.0000 [IU] | Freq: Every day | SUBCUTANEOUS | 0 refills | Status: DC

## 2017-10-18 NOTE — Patient Instructions (Addendum)
Please reduce the lantus to 55 units each morning When you use this up, please take "70/30," 55 units with breakfast, and none with supper.   On this type of insulin schedule, you should eat meals on a regular schedule.  If a meal is missed or significantly delayed, your blood sugar could go low.   check your blood sugar twice a day.  vary the time of day when you check, between before the 3 meals, and at bedtime.  also check if you have symptoms of your blood sugar being too high or too low.  please keep a record of the readings and bring it to your next appointment here.  You can write it on any piece of paper.  please call us sooner if your blood sugar goes below 70, or if you have a lot of readings over 200.   Please come back for a follow-up appointment in 3 months.

## 2017-10-18 NOTE — Progress Notes (Signed)
Subjective:    Patient ID: Marcus Weaver, male    DOB: 1930/11/14, 84 y.o.   MRN: [id number]  HPI Pt returns for f/u of diabetes mellitus: DM type: Insulin-requiring type 2 Dx'ed: 2009.  Complications: renal insufficiency, DR, and polyneuropathy.  Therapy: insulin since 2014 (and metformin).   DKA: never.   Severe hypoglycemia: twice (2011 and 2017).   Pancreatitis: never.   Other: he was changed to QD insulin, after poor results with multiple daily injections.  dtr draws up insulin ahead of time.   Interval history: He brings a record of his cbg's which I have reviewed today.  It varies from 51-200's.  There is no trend throughout the day. Pt says he never misses the insulin.  pt states he feels well in general.  He takes lantus 60 units qam, from a friend's supply, due to cost.   Past Medical History:  Diagnosis Date  . Abnormal EKG 08/14/2015  . CKD (chronic kidney disease), stage II   . Diabetes mellitus    type2  . Hyperlipidemia   . Hypertension   . Hypertensive heart disease 08/14/2015  . Tachycardia    a. HR 132 at endo OV 06/2015, no EKG at that time.  Marland Kitchen UTI (lower urinary tract infection)     Past Surgical History:  Procedure Laterality Date  . CATARACT EXTRACTION      Social History   Socioeconomic History  . Marital status: Single    Spouse name: Not on file  . Number of children: Not on file  . Years of education: Not on file  . Highest education level: Not on file  Occupational History  . Occupation: retired  Scientific laboratory technician  . Financial resource strain: Not on file  . Food insecurity:    Worry: Not on file    Inability: Not on file  . Transportation needs:    Medical: Not on file    Non-medical: Not on file  Tobacco Use  . Smoking status: Former Smoker    Types: Cigarettes    Last attempt to quit: 06/25/2015    Years since quitting: 2.3  . Smokeless tobacco: Never Used  . Tobacco comment: smokes 3 or 4 cigarettes /day  Substance and Sexual Activity    . Alcohol use: No  . Drug use: No  . Sexual activity: Not on file  Lifestyle  . Physical activity:    Days per week: Not on file    Minutes per session: Not on file  . Stress: Not on file  Relationships  . Social connections:    Talks on phone: Not on file    Gets together: Not on file    Attends religious service: Not on file    Active member of club or organization: Not on file    Attends meetings of clubs or organizations: Not on file    Relationship status: Not on file  . Intimate partner violence:    Fear of current or ex partner: Not on file    Emotionally abused: Not on file    Physically abused: Not on file    Forced sexual activity: Not on file  Other Topics Concern  . Not on file  Social History Narrative  . Not on file    Current Outpatient Medications on File Prior to Visit  Medication Sig Dispense Refill  . amLODipine (NORVASC) 5 MG tablet Take 1 tablet (5 mg total) by mouth daily. Please make an overdue appt with Dr. Radford Pax before anymore  refills. 3rd and Final 15 tablet 0  . aspirin EC 81 MG tablet Take 81 mg by mouth daily.    . carvedilol (COREG) 6.25 MG tablet Take 1 tablet (6.25 mg total) by mouth 2 (two) times daily. (Patient taking differently: Take 12.5 mg by mouth 2 (two) times daily. ) 60 tablet 11  . furosemide (LASIX) 80 MG tablet Take 2 tablets in the morning and 1 tablet at noon  10  . glucose blood (ONETOUCH VERIO) test strip 1 each by Other route 2 (two) times daily. And lancets 2/day 200 each 3  . lisinopril (PRINIVIL,ZESTRIL) 40 MG tablet 1/2 tab daily  5  . ONETOUCH VERIO test strip USE TWICE DAILY 200 each 3  . QUEtiapine (SEROQUEL) 25 MG tablet TAKE 1 TABLET(25 MG) BY MOUTH AT BEDTIME 90 tablet 0   No current facility-administered medications on file prior to visit.     No Known Allergies  Family History  Problem Relation Age of Onset  . Diabetes Other        1st degree relative  . Hypertension Other   . Kidney disease Mother   .  Cancer Neg Hx     BP (!) 170/80 (BP Location: Right Arm, Patient Position: Sitting, Cuff Size: Normal)   Pulse 95   Temp 98.2 F (36.8 C) (Oral)   Ht 5\' 7"  (1.702 m)   Wt 218 lb (98.9 kg)   SpO2 94%   BMI 34.14 kg/m   Review of Systems Denies LOC    Objective:   Physical Exam VITAL SIGNS:  See vs page.  GENERAL: no distress. Pulses: foot pulses are intact bilaterally.   MSK: no deformity of the feet or ankles.  CV: 1+ bilat edema of the legs.  Skin:  no ulcer on the feet or ankles.  normal color and temp on the feet and ankles.   Neuro: sensation is intact to touch on the feet and ankles.  Ext: There is bilateral onychomycosis of the toenails.    Lab Results  Component Value Date   HGBA1C 8.1 (A) 10/18/2017   Lab Results  Component Value Date   CREATININE 3.19 (H) 01/06/2017   BUN 67 (H) 01/06/2017   NA 143 01/06/2017   K 4.5 01/06/2017   CL 103 01/06/2017   CO2 31 01/06/2017      Assessment & Plan:  Insulin-requiring type 2 DM, with DR: he needs increased rx, if it can be done with a regimen that avoids or minimizes hypoglycemia. Hypoglycemia, due to lantus Renal failure: he needs a faster-acting qd insulin  Patient Instructions  Please reduce the lantus to 55 units each morning When you use this up, please take "70/30," 55 units with breakfast, and none with supper.   On this type of insulin schedule, you should eat meals on a regular schedule.  If a meal is missed or significantly delayed, your blood sugar could go low.   check your blood sugar twice a day.  vary the time of day when you check, between before the 3 meals, and at bedtime.  also check if you have symptoms of your blood sugar being too high or too low.  please keep a record of the readings and bring it to your next appointment here.  You can write it on any piece of paper.  please call us sooner if your blood sugar goes below 70, or if you have a lot of readings over 200.   Please come back for a  follow-up appointment in 3 months.

## 2017-10-18 NOTE — Progress Notes (Signed)
Subjective:   Patient ID: Marcus Weaver, male   DOB: 84 y.o.   MRN: [id number]   HPI Patient presents with elongated nailbeds 1-5 both feet that are thick yellow brittle and painful and he cannot cut   ROS      Objective:  Physical Exam  Neurovascular status on change with thick yellow brittle nailbeds 1-5 both feet that are painful     Assessment:  Chronic mycotic nail infection 1-5 both feet     Plan:  Debride painful nailbeds 1-5 both feet with no iatrogenic bleeding noted

## 2017-12-06 DIAGNOSIS — N2581 Secondary hyperparathyroidism of renal origin: Secondary | ICD-10-CM | POA: Diagnosis not present

## 2017-12-06 DIAGNOSIS — I129 Hypertensive chronic kidney disease with stage 1 through stage 4 chronic kidney disease, or unspecified chronic kidney disease: Secondary | ICD-10-CM | POA: Diagnosis not present

## 2017-12-06 DIAGNOSIS — D631 Anemia in chronic kidney disease: Secondary | ICD-10-CM | POA: Diagnosis not present

## 2017-12-06 DIAGNOSIS — F0390 Unspecified dementia without behavioral disturbance: Secondary | ICD-10-CM | POA: Diagnosis not present

## 2017-12-06 DIAGNOSIS — I739 Peripheral vascular disease, unspecified: Secondary | ICD-10-CM | POA: Diagnosis not present

## 2017-12-06 DIAGNOSIS — E785 Hyperlipidemia, unspecified: Secondary | ICD-10-CM | POA: Diagnosis not present

## 2017-12-06 DIAGNOSIS — E1122 Type 2 diabetes mellitus with diabetic chronic kidney disease: Secondary | ICD-10-CM | POA: Diagnosis not present

## 2017-12-06 DIAGNOSIS — Z0000 Encounter for general adult medical examination without abnormal findings: Secondary | ICD-10-CM | POA: Diagnosis not present

## 2017-12-06 DIAGNOSIS — N189 Chronic kidney disease, unspecified: Secondary | ICD-10-CM | POA: Diagnosis not present

## 2017-12-06 DIAGNOSIS — N184 Chronic kidney disease, stage 4 (severe): Secondary | ICD-10-CM | POA: Diagnosis not present

## 2017-12-26 ENCOUNTER — Other Ambulatory Visit: Payer: Self-pay | Admitting: Endocrinology

## 2017-12-26 ENCOUNTER — Other Ambulatory Visit: Payer: Self-pay

## 2017-12-26 MED ORDER — INSULIN NPH ISOPHANE & REGULAR (70-30) 100 UNIT/ML ~~LOC~~ SUSP: 55.0000 [IU] | Freq: Every day | SUBCUTANEOUS | 0 refills | Status: DC

## 2017-12-26 NOTE — Telephone Encounter (Signed)
Received fax from [facility] stating Humulin is not covered, according to patient's chart he is on Novolin. Called pt got daughter (on [place]) she verified pt is on Novolin and that he is due for a refill. Refill sent to pharmacy. Nothing further is needed

## 2018-01-11 ENCOUNTER — Other Ambulatory Visit: Payer: Self-pay | Admitting: Family Medicine

## 2018-01-17 ENCOUNTER — Ambulatory Visit: Payer: PPO | Admitting: Podiatry

## 2018-01-18 ENCOUNTER — Ambulatory Visit: Payer: PPO | Admitting: Endocrinology

## 2018-01-19 ENCOUNTER — Ambulatory Visit (INDEPENDENT_AMBULATORY_CARE_PROVIDER_SITE_OTHER): Payer: PPO | Admitting: Endocrinology

## 2018-01-19 ENCOUNTER — Ambulatory Visit: Payer: PPO | Admitting: Podiatry

## 2018-01-19 ENCOUNTER — Encounter: Payer: Self-pay | Admitting: Endocrinology

## 2018-01-19 VITALS — BP 142/60 | HR 110 | Ht 67.0 in | Wt 216.4 lb

## 2018-01-19 DIAGNOSIS — Z23 Encounter for immunization: Secondary | ICD-10-CM | POA: Diagnosis not present

## 2018-01-19 DIAGNOSIS — M79675 Pain in left toe(s): Secondary | ICD-10-CM | POA: Diagnosis not present

## 2018-01-19 DIAGNOSIS — B351 Tinea unguium: Secondary | ICD-10-CM

## 2018-01-19 DIAGNOSIS — M79674 Pain in right toe(s): Secondary | ICD-10-CM

## 2018-01-19 DIAGNOSIS — E1122 Type 2 diabetes mellitus with diabetic chronic kidney disease: Secondary | ICD-10-CM

## 2018-01-19 DIAGNOSIS — N183 Chronic kidney disease, stage 3 unspecified: Secondary | ICD-10-CM

## 2018-01-19 DIAGNOSIS — E1151 Type 2 diabetes mellitus with diabetic peripheral angiopathy without gangrene: Secondary | ICD-10-CM | POA: Diagnosis not present

## 2018-01-19 DIAGNOSIS — Z794 Long term (current) use of insulin: Secondary | ICD-10-CM | POA: Diagnosis not present

## 2018-01-19 LAB — POCT GLYCOSYLATED HEMOGLOBIN (HGB A1C): HEMOGLOBIN A1C: 8.3 % — AB (ref 4.0–5.6)

## 2018-01-19 MED ORDER — INSULIN NPH ISOPHANE & REGULAR (70-30) 100 UNIT/ML ~~LOC~~ SUSP: 50.0000 [IU] | Freq: Every day | SUBCUTANEOUS | 11 refills | Status: DC

## 2018-01-19 NOTE — Patient Instructions (Signed)
Please reduce the insulin to 50 units each morning.  On this type of insulin schedule, you should eat meals on a regular schedule.  If a meal is missed or significantly delayed, your blood sugar could go low.   check your blood sugar twice a day.  vary the time of day when you check, between before the 3 meals, and at bedtime.  also check if you have symptoms of your blood sugar being too high or too low.  please keep a record of the readings and bring it to your next appointment here.  You can write it on any piece of paper.  please call us sooner if your blood sugar goes below 70, or if you have a lot of readings over 200.   Please come back for a follow-up appointment in 3 months.

## 2018-01-19 NOTE — Progress Notes (Signed)
Subjective:    Patient ID: Marcus Weaver, male    DOB: 1930-12-19, 84 y.o.   MRN: [id number]  HPI Pt returns for f/u of diabetes mellitus: DM type: Insulin-requiring type 2 Dx'ed: 2009.  Complications: renal insufficiency, DR, and polyneuropathy.  Therapy: insulin since 2014 (and metformin).   DKA: never.   Severe hypoglycemia: twice (2011 and 2017).   Pancreatitis: never.   Other: he was changed to QD insulin, after poor results with multiple daily injections.  dtr draws up insulin ahead of time; lantus was changed to 70/30, due to renal failure Interval history: He has been on the 70/30 x 2 weeks.  no cbg record, but states cbg's vary from 65-300.  It is in general higher as the day goes on.  pt states he feels well in general.   Past Medical History:  Diagnosis Date  . Abnormal EKG 08/14/2015  . CKD (chronic kidney disease), stage II   . Diabetes mellitus    type2  . Hyperlipidemia   . Hypertension   . Hypertensive heart disease 08/14/2015  . Tachycardia    a. HR 132 at endo OV 06/2015, no EKG at that time.  Marland Kitchen UTI (lower urinary tract infection)     Past Surgical History:  Procedure Laterality Date  . CATARACT EXTRACTION      Social History   Socioeconomic History  . Marital status: Single    Spouse name: Not on file  . Number of children: Not on file  . Years of education: Not on file  . Highest education level: Not on file  Occupational History  . Occupation: retired  Scientific laboratory technician  . Financial resource strain: Not on file  . Food insecurity:    Worry: Not on file    Inability: Not on file  . Transportation needs:    Medical: Not on file    Non-medical: Not on file  Tobacco Use  . Smoking status: Former Smoker    Types: Cigarettes    Last attempt to quit: 06/25/2015    Years since quitting: 2.5  . Smokeless tobacco: Never Used  . Tobacco comment: smokes 3 or 4 cigarettes /day  Substance and Sexual Activity  . Alcohol use: No  . Drug use: No  . Sexual  activity: Not on file  Lifestyle  . Physical activity:    Days per week: Not on file    Minutes per session: Not on file  . Stress: Not on file  Relationships  . Social connections:    Talks on phone: Not on file    Gets together: Not on file    Attends religious service: Not on file    Active member of club or organization: Not on file    Attends meetings of clubs or organizations: Not on file    Relationship status: Not on file  . Intimate partner violence:    Fear of current or ex partner: Not on file    Emotionally abused: Not on file    Physically abused: Not on file    Forced sexual activity: Not on file  Other Topics Concern  . Not on file  Social History Narrative  . Not on file    Current Outpatient Medications on File Prior to Visit  Medication Sig Dispense Refill  . amLODipine (NORVASC) 5 MG tablet Take 1 tablet (5 mg total) by mouth daily. Please make an overdue appt with Dr. Radford Pax before anymore refills. 3rd and Final 15 tablet 0  . aspirin  EC 81 MG tablet Take 81 mg by mouth daily.    . carvedilol (COREG) 6.25 MG tablet Take 1 tablet (6.25 mg total) by mouth 2 (two) times daily. (Patient taking differently: Take 12.5 mg by mouth 2 (two) times daily. ) 60 tablet 11  . furosemide (LASIX) 80 MG tablet Take 2 tablets in the morning and 1 tablet at noon  10  . glucose blood (ONETOUCH VERIO) test strip 1 each by Other route 2 (two) times daily. And lancets 2/day 200 each 3  . lisinopril (PRINIVIL,ZESTRIL) 40 MG tablet 1/2 tab daily  5  . ONETOUCH VERIO test strip USE TWICE DAILY 200 each 3  . QUEtiapine (SEROQUEL) 25 MG tablet TAKE 1 TABLET(25 MG) BY MOUTH AT BEDTIME 90 tablet 0   No current facility-administered medications on file prior to visit.     No Known Allergies  Family History  Problem Relation Age of Onset  . Diabetes Other        1st degree relative  . Hypertension Other   . Kidney disease Mother   . Cancer Neg Hx     BP (!) 142/60 (BP Location:  Right Arm, Patient Position: Sitting, Cuff Size: Large)   Pulse (!) 110   Ht 5\' 7"  (1.702 m)   Wt 216 lb 6.4 oz (98.2 kg)   SpO2 93%   BMI 33.89 kg/m   Review of Systems Denies LOC    Objective:   Physical Exam VITAL SIGNS:  See vs page.  GENERAL: no distress. Pulses: foot pulses are intact bilaterally.   MSK: no deformity of the feet or ankles.  CV: trace bilat edema of the legs.  Skin:  no ulcer on the feet or ankles.  normal color and temp on the feet and ankles.   Neuro: sensation is intact to touch on the feet and ankles.   Ext: There is bilateral onychomycosis of the toenails.   Lab Results  Component Value Date   HGBA1C 8.3 (A) 01/19/2018   Lab Results  Component Value Date   CREATININE 3.19 (H) 01/06/2017   BUN 67 (H) 01/06/2017   NA 143 01/06/2017   K 4.5 01/06/2017   CL 103 01/06/2017   CO2 31 01/06/2017      Assessment & Plan:  Insulin-requiring type 2 DM, with renal insuff: worse Hypoglycemia: this limits aggressiveness of glycemic control Frail elderly state: he is not a candidate for intensive glycemic control.   Patient Instructions  Please reduce the insulin to 50 units each morning.  On this type of insulin schedule, you should eat meals on a regular schedule.  If a meal is missed or significantly delayed, your blood sugar could go low.   check your blood sugar twice a day.  vary the time of day when you check, between before the 3 meals, and at bedtime.  also check if you have symptoms of your blood sugar being too high or too low.  please keep a record of the readings and bring it to your next appointment here.  You can write it on any piece of paper.  please call us sooner if your blood sugar goes below 70, or if you have a lot of readings over 200.   Please come back for a follow-up appointment in 3 months.

## 2018-01-19 NOTE — Patient Instructions (Signed)

## 2018-01-26 ENCOUNTER — Other Ambulatory Visit: Payer: Self-pay | Admitting: Endocrinology

## 2018-02-06 ENCOUNTER — Encounter: Payer: Self-pay | Admitting: Podiatry

## 2018-02-06 NOTE — Progress Notes (Signed)
Subjective: Marcus Weaver presents today with history of peripheral arterial disease for management of painful, mycotic toenails. Pain is aggravated when wearing enclosed shoe gear. Pain isrelieved with periodic professional debridement.   Mr. Marcus Weaver voices no new pedal problems on today's visit.  PCP: Dr. Annye Asa; last date seen 7/30/20149  Objective: Vascular Examination: Dorsalis pedis and posterior tibial pulses are faintly palpable bilaterally. No digital hair noted bilaterally. Capillary refill time is greater than 5 seconds to all 10 digits. Pedal edema noted bilaterally. Skin temperature gradient noted to be warm to cool bilaterally There is no sign of an acute ischemic event to digits.  Dermatological Examination: No open wounds bilaterally. No interdigital macerations noted bilaterally Toenails 1-5 b/l discolored, thick, dystrophic with subungual debris and pain with palpation to nailbeds due to thickness of nails.  Musculoskeletal: Muscle strength 5/5 to all LE muscle groups Hallux valgus with bunion deformity noted bilaterally Hammertoe left second digit Utilizes walker for mobility assistance  Neurological: Sensation intact with 10 gram monofilament b/l Vibratory sensation intact b/l  Assessment: 1. Painful onychomycosis toenails 1-5 b/l 2. Diabetes with peripheral arterial disease  Plan: 1. Continue diabetic foot care principles.  Literature dispensed to patient on today's visit. 2. Toenails 1-5 b/l were debrided in length and girth without iatrogenic bleeding. 3. Patient to continue soft, supportive shoe gear 4. Patient to report any pedal injuries to medical professional  5. Follow up 3 months.  6. Patient/POA to call should there be a concern in the interim.

## 2018-02-20 ENCOUNTER — Other Ambulatory Visit: Payer: Self-pay | Admitting: Cardiology

## 2018-02-23 ENCOUNTER — Telehealth: Payer: Self-pay

## 2018-02-23 NOTE — Telephone Encounter (Signed)
Faxed form from [facility] stating that PA is not required for Humulin 70/30 as it is on pt list of covered drugs to pharmacy/thx dmf

## 2018-02-28 ENCOUNTER — Other Ambulatory Visit: Payer: Self-pay | Admitting: Endocrinology

## 2018-04-13 ENCOUNTER — Other Ambulatory Visit: Payer: Self-pay | Admitting: Family Medicine

## 2018-04-13 NOTE — Telephone Encounter (Signed)
Last OV 01/06/17 seroquel last filled 01/11/18 #90 with 0

## 2018-04-20 ENCOUNTER — Ambulatory Visit: Payer: PPO | Admitting: Podiatry

## 2018-04-24 ENCOUNTER — Encounter: Payer: Self-pay | Admitting: Endocrinology

## 2018-04-24 ENCOUNTER — Ambulatory Visit (INDEPENDENT_AMBULATORY_CARE_PROVIDER_SITE_OTHER): Payer: Medicare Other | Admitting: Endocrinology

## 2018-04-24 VITALS — BP 130/60 | HR 87 | Ht 67.0 in | Wt 219.6 lb

## 2018-04-24 DIAGNOSIS — N183 Chronic kidney disease, stage 3 unspecified: Secondary | ICD-10-CM

## 2018-04-24 DIAGNOSIS — E1122 Type 2 diabetes mellitus with diabetic chronic kidney disease: Secondary | ICD-10-CM | POA: Diagnosis not present

## 2018-04-24 DIAGNOSIS — Z794 Long term (current) use of insulin: Secondary | ICD-10-CM

## 2018-04-24 LAB — POCT GLYCOSYLATED HEMOGLOBIN (HGB A1C): Hemoglobin A1C: 7.8 % — AB (ref 4.0–5.6)

## 2018-04-24 NOTE — Progress Notes (Signed)
Subjective:    Patient ID: Marcus Weaver, male    DOB: 1931/03/22, 85 y.o.   MRN: [id number]  HPI Pt returns for f/u of diabetes mellitus: DM type: Insulin-requiring type 2 Dx'ed: 2009.  Complications: renal insufficiency, DR, and polyneuropathy.  Therapy: insulin since 2014 (and metformin).   DKA: never.   Severe hypoglycemia: twice (2011 and 2017).   Pancreatitis: never.   Other: he was changed to QD insulin, after poor results with multiple daily injections.  dtr draws up insulin ahead of time; lantus was changed to 70/30, due to renal failure.   Interval history: no cbg record, but states cbg's vary from 82-216.  It is in general higher as the day goes on.  pt states he feels well in general.   Past Medical History:  Diagnosis Date  . Abnormal EKG 08/14/2015  . CKD (chronic kidney disease), stage II   . Diabetes mellitus    type2  . Hyperlipidemia   . Hypertension   . Hypertensive heart disease 08/14/2015  . Tachycardia    a. HR 132 at endo OV 06/2015, no EKG at that time.  Marland Kitchen UTI (lower urinary tract infection)     Past Surgical History:  Procedure Laterality Date  . CATARACT EXTRACTION      Social History   Socioeconomic History  . Marital status: Single    Spouse name: Not on file  . Number of children: Not on file  . Years of education: Not on file  . Highest education level: Not on file  Occupational History  . Occupation: retired  Scientific laboratory technician  . Financial resource strain: Not on file  . Food insecurity:    Worry: Not on file    Inability: Not on file  . Transportation needs:    Medical: Not on file    Non-medical: Not on file  Tobacco Use  . Smoking status: Former Smoker    Types: Cigarettes    Last attempt to quit: 06/25/2015    Years since quitting: 2.8  . Smokeless tobacco: Never Used  . Tobacco comment: smokes 3 or 4 cigarettes /day  Substance and Sexual Activity  . Alcohol use: No  . Drug use: No  . Sexual activity: Not on file  Lifestyle  .  Physical activity:    Days per week: Not on file    Minutes per session: Not on file  . Stress: Not on file  Relationships  . Social connections:    Talks on phone: Not on file    Gets together: Not on file    Attends religious service: Not on file    Active member of club or organization: Not on file    Attends meetings of clubs or organizations: Not on file    Relationship status: Not on file  . Intimate partner violence:    Fear of current or ex partner: Not on file    Emotionally abused: Not on file    Physically abused: Not on file    Forced sexual activity: Not on file  Other Topics Concern  . Not on file  Social History Narrative  . Not on file    Current Outpatient Medications on File Prior to Visit  Medication Sig Dispense Refill  . amLODipine (NORVASC) 5 MG tablet Take 1 tablet (5 mg total) by mouth daily. Please make an overdue appt with Dr. Radford Pax before anymore refills. 3rd and Final 15 tablet 0  . aspirin EC 81 MG tablet Take 81 mg by  mouth daily.    . carvedilol (COREG) 6.25 MG tablet Take 1 tablet (6.25 mg total) by mouth 2 (two) times daily. (Patient taking differently: Take 12.5 mg by mouth 2 (two) times daily. ) 60 tablet 11  . furosemide (LASIX) 80 MG tablet Take 2 tablets in the morning and 1 tablet at noon  10  . glucose blood (ONETOUCH VERIO) test strip 1 each by Other route 2 (two) times daily. And lancets 2/day 200 each 3  . lisinopril (PRINIVIL,ZESTRIL) 40 MG tablet 1/2 tab daily  5  . NOVOLIN 70/30 (70-30) 100 UNIT/ML injection INJECT 55 UNITS UNDER THE SKIN DAILY WITH BREAKFAST 20 mL 0  . ONETOUCH VERIO test strip USE TWICE DAILY 200 each 3  . QUEtiapine (SEROQUEL) 25 MG tablet TAKE 1 TABLET(25 MG) BY MOUTH AT BEDTIME 90 tablet 0   No current facility-administered medications on file prior to visit.     No Known Allergies  Family History  Problem Relation Age of Onset  . Diabetes Other        1st degree relative  . Hypertension Other   . Kidney  disease Mother   . Cancer Neg Hx     BP 130/60 (BP Location: Right Arm, Patient Position: Sitting, Cuff Size: Large)   Pulse 87   Ht 5\' 7"  (1.702 m)   Wt 219 lb 9.6 oz (99.6 kg)   SpO2 90%   BMI 34.39 kg/m   Review of Systems He denies hypoglycemia.      Objective:   Physical Exam VITAL SIGNS:  See vs page.  GENERAL: no distress. Pulses: foot pulses are intact bilaterally.   MSK: no deformity of the feet or ankles.  CV: 1+ bilat edema of the legs.  Skin:  no ulcer on the feet or ankles.  normal color and temp on the feet and ankles.   Neuro: sensation is intact to touch on the feet and ankles.   Ext: There is severe bilateral onychomycosis of the toenails.   Lab Results  Component Value Date   CREATININE 3.19 (H) 01/06/2017   BUN 67 (H) 01/06/2017   NA 143 01/06/2017   K 4.5 01/06/2017   CL 103 01/06/2017   CO2 31 01/06/2017     Lab Results  Component Value Date   HGBA1C 7.8 (A) 04/24/2018       Assessment & Plan:  Insulin-requiring type 2 DM, with DR: this is the best control this pt should aim for, given this regimen, which does match insulin to his changing needs throughout the day Renal failure: she may need to change to 50/50 insulin, but that is more expensive, so we'll continue the same for now.     Patient Instructions  Please continue the same insulin On this type of insulin schedule, you should eat meals on a regular schedule.  If a meal is missed or significantly delayed, your blood sugar could go low.   check your blood sugar twice a day.  vary the time of day when you check, between before the 3 meals, and at bedtime.  also check if you have symptoms of your blood sugar being too high or too low.  please keep a record of the readings and bring it to your next appointment here.  You can write it on any piece of paper.  please call us sooner if your blood sugar goes below 70, or if you have a lot of readings over 200.   Please come back for a  follow-up  appointment in 3 months.

## 2018-04-24 NOTE — Patient Instructions (Addendum)
Please continue the same insulin On this type of insulin schedule, you should eat meals on a regular schedule.  If a meal is missed or significantly delayed, your blood sugar could go low.   check your blood sugar twice a day.  vary the time of day when you check, between before the 3 meals, and at bedtime.  also check if you have symptoms of your blood sugar being too high or too low.  please keep a record of the readings and bring it to your next appointment here.  You can write it on any piece of paper.  please call us sooner if your blood sugar goes below 70, or if you have a lot of readings over 200.   Please come back for a follow-up appointment in 3 months.

## 2018-05-05 ENCOUNTER — Ambulatory Visit: Payer: PPO | Admitting: Podiatry

## 2018-05-05 ENCOUNTER — Encounter: Payer: Self-pay | Admitting: Podiatry

## 2018-05-05 DIAGNOSIS — E1151 Type 2 diabetes mellitus with diabetic peripheral angiopathy without gangrene: Secondary | ICD-10-CM

## 2018-05-05 DIAGNOSIS — B351 Tinea unguium: Secondary | ICD-10-CM | POA: Diagnosis not present

## 2018-05-05 NOTE — Patient Instructions (Signed)

## 2018-05-08 NOTE — Progress Notes (Signed)
Subjective: Marcus Weaver is a 85 y.o. y.o. male who presents for preventative foot care today with PAD and cc of painful, discolored, thick toenails and painful callus/corn which interfere with daily activities. Pain is aggravated when wearing enclosed shoe gear. Pain is relieved with periodic professional debridement.  [name], MD is his PCP.  He also sees Dr. Renato Shin for diabetes management and last visit was 04/24/2018.   Current Outpatient Medications:  .  amLODipine (NORVASC) 5 MG tablet, Take 1 tablet (5 mg total) by mouth daily. Please make an overdue appt with Dr. Radford Pax before anymore refills. 3rd and Final, Disp: 15 tablet, Rfl: 0 .  aspirin EC 81 MG tablet, Take 81 mg by mouth daily., Disp: , Rfl:  .  carvedilol (COREG) 6.25 MG tablet, Take 1 tablet (6.25 mg total) by mouth 2 (two) times daily. (Patient taking differently: Take 12.5 mg by mouth 2 (two) times daily. ), Disp: 60 tablet, Rfl: 11 .  furosemide (LASIX) 80 MG tablet, Take 2 tablets in the morning and 1 tablet at noon, Disp: , Rfl: 10 .  glucose blood (ONETOUCH VERIO) test strip, 1 each by Other route 2 (two) times daily. And lancets 2/day, Disp: 200 each, Rfl: 3 .  lisinopril (PRINIVIL,ZESTRIL) 40 MG tablet, 1/2 tab daily, Disp: , Rfl: 5 .  NOVOLIN 70/30 (70-30) 100 UNIT/ML injection, INJECT 55 UNITS UNDER THE SKIN DAILY WITH BREAKFAST, Disp: 20 mL, Rfl: 0 .  ONETOUCH VERIO test strip, USE TWICE DAILY, Disp: 200 each, Rfl: 3 .  QUEtiapine (SEROQUEL) 25 MG tablet, TAKE 1 TABLET(25 MG) BY MOUTH AT BEDTIME, Disp: 90 tablet, Rfl: 0  No Known Allergies  Objective: Vascular Examination: Capillary refill time <4 seconds x 10 digits  Dorsalis pedis pulses are faintly palpable b/l  Posterior tibial pulses are faintly palpable b/l  No digital hair x 10 digits  Skin temperature warm to cool b/l.  Trace pedal edema noted b/l.  No acute digital ischemia noted b/l.  Dermatological Examination: Skin thin  and atrophic b/l.  Toenails 1-5 b/l discolored, thick, dystrophic with subungual debris and pain with palpation to nailbeds due to thickness of nails.  No open wounds b/l.  No interdigital macerations noted b/l.  Musculoskeletal: Muscle strength 5/5 to all LE muscle groups  HAV with bunion deformity b/l  Hammertoe left 2nd digit  Utilizes walker for mobility assistance.  Neurological: Sensation intact  with 10 gram monofilament.  Vibratory sensation intact b/l   Assessment: 1. Painful onychomycosis toenails 1-5 b/l 2. NIDDM with peripheral arterial disease  Plan: 1. Discuss diabetic foot care principles. Literature dispensed. 2. Toenails 1-5 b/l were debrided in length and girth without iatrogenic bleeding. 3. Patient to continue soft, supportive shoe gear 4. Patient to report any pedal injuries to medical professional  5. Follow up 3 months.  6. Patient/POA to call should there be a concern in the interim.

## 2018-05-21 ENCOUNTER — Other Ambulatory Visit: Payer: Self-pay | Admitting: Endocrinology

## 2018-07-01 ENCOUNTER — Other Ambulatory Visit: Payer: Self-pay | Admitting: Endocrinology

## 2018-07-03 ENCOUNTER — Telehealth: Payer: Self-pay | Admitting: Cardiology

## 2018-07-03 NOTE — Telephone Encounter (Signed)
Lvmon to call and schedule virtual visit with Turner.

## 2018-07-11 ENCOUNTER — Other Ambulatory Visit: Payer: Self-pay | Admitting: Family Medicine

## 2018-07-11 NOTE — Progress Notes (Signed)
This encounter was created in error - please disregard.

## 2018-07-12 ENCOUNTER — Other Ambulatory Visit: Payer: Self-pay

## 2018-07-12 ENCOUNTER — Encounter: Payer: Medicare Other | Admitting: Cardiology

## 2018-07-13 ENCOUNTER — Encounter: Payer: Self-pay | Admitting: Cardiology

## 2018-07-13 ENCOUNTER — Other Ambulatory Visit: Payer: Self-pay

## 2018-07-13 ENCOUNTER — Telehealth (INDEPENDENT_AMBULATORY_CARE_PROVIDER_SITE_OTHER): Payer: Medicare Other | Admitting: Cardiology

## 2018-07-13 DIAGNOSIS — N183 Chronic kidney disease, stage 3 unspecified: Secondary | ICD-10-CM

## 2018-07-13 DIAGNOSIS — I119 Hypertensive heart disease without heart failure: Secondary | ICD-10-CM

## 2018-07-13 DIAGNOSIS — E0822 Diabetes mellitus due to underlying condition with diabetic chronic kidney disease: Secondary | ICD-10-CM | POA: Diagnosis not present

## 2018-07-13 DIAGNOSIS — R000 Tachycardia, unspecified: Secondary | ICD-10-CM

## 2018-07-13 DIAGNOSIS — Z794 Long term (current) use of insulin: Secondary | ICD-10-CM

## 2018-07-13 DIAGNOSIS — I10 Essential (primary) hypertension: Secondary | ICD-10-CM

## 2018-07-13 NOTE — Progress Notes (Signed)
Virtual Visit via Telephone Note   This visit type was conducted due to national recommendations for restrictions regarding the COVID-19 Pandemic (e.g. social distancing) in an effort to limit this patient's exposure and mitigate transmission in our community.  Due to his co-morbid illnesses, this patient is at least at moderate risk for complications without adequate follow up.  This format is felt to be most appropriate for this patient at this time.  All issues noted in this document were discussed and addressed.  A limited physical exam was performed with this format.  Please refer to the patient's chart for his consent to telehealth for [facility].  Evaluation Performed:  Follow-up visit  This visit type was conducted due to national recommendations for restrictions regarding the COVID-19 Pandemic (e.g. social distancing).  This format is felt to be most appropriate for this patient at this time.  All issues noted in this document were discussed and addressed.  No physical exam was performed (except for noted visual exam findings with Video Visits).  Please refer to the patient's chart (MyChart message for video visits and phone note for telephone visits) for the patient's consent to telehealth for [facility].  Date:  4/232020   ID:  Marcus Weaver, DOB Nov 13, 1930, MRN [id number]  Patient Location:  Home  Provider location:   [place]  PCP:  [name], MD  Cardiologist:  Fransico Him, MD Electrophysiologist:  None   Chief Complaint:  palpitations  History of Present Illness:    Marcus Weaver is a 85 y.o. male who presents via audio/video conferencing for a telehealth visit today.    This is an 85yo male with a history of HTN, DM type 2, hyperlipidemia and CKD.  I last saw him 3 years ago for palpitations and poorly controlled HTN.  His meds were adjusted.  He also was noted to have some palpitations and fast heart rate and event monitor showed controlled HR and no  arrhythmias.  2D echo showed normal LVF with EF 65-70% with G1DD, Calcified AV with no stenosis, mild LAE.    He was seen back by Melina Copa, PA and BP was mildly elevated but due to advanced age no changes were made to medications.   He is here today for followup and is doing well.  He denies any chest pain or pressure, PND, orthopnea, LE edema, dizziness, palpitations or syncope. He has chronic DOE that mainly occurs with extreme exertion for him like walking to get the main.  This is stable.  He is compliant with his meds and is tolerating meds with no SE.    The patient does not have symptoms concerning for COVID-19 infection (fever, chills, cough, or new shortness of breath).    Prior CV studies:   The following studies were reviewed today:  Event monitor and 2D echo  Past Medical History:  Diagnosis Date  . Abnormal EKG 08/14/2015  . CKD (chronic kidney disease), stage II   . Diabetes mellitus    type2  . Hyperlipidemia   . Hypertension   . Hypertensive heart disease 08/14/2015  . Tachycardia    a. HR 132 at endo OV 06/2015, no EKG at that time.  Marland Kitchen UTI (lower urinary tract infection)    Past Surgical History:  Procedure Laterality Date  . CATARACT EXTRACTION       No outpatient medications have been marked as taking for the 07/12/18 encounter (Appointment) with Sueanne Margarita, MD.     Allergies:  Patient has no known allergies.   Social History   Tobacco Use  . Smoking status: Former Smoker    Types: Cigarettes    Last attempt to quit: 06/25/2015    Years since quitting: 3.0  . Smokeless tobacco: Never Used  . Tobacco comment: smokes 3 or 4 cigarettes /day  Substance Use Topics  . Alcohol use: No  . Drug use: No     Family Hx: The patient's family history includes Diabetes in an other family member; Hypertension in an other family member; Kidney disease in his mother. There is no history of Cancer.  ROS:   Please see the history of present illness.     All  other systems reviewed and are negative.   Labs/Other Tests and Data Reviewed:    Recent Labs: No results found for requested labs within last 8760 hours.   Recent Lipid Panel Lab Results  Component Value Date/Time   CHOL 189 01/06/2017 11:07 AM   TRIG 153.0 (H) 01/06/2017 11:07 AM   HDL 32.30 (L) 01/06/2017 11:07 AM   CHOLHDL 6 01/06/2017 11:07 AM   LDLCALC 126 (H) 01/06/2017 11:07 AM    Wt Readings from Last 3 Encounters:  04/24/18 219 lb 9.6 oz (99.6 kg)  01/19/18 216 lb 6.4 oz (98.2 kg)  10/18/17 218 lb (98.9 kg)     Objective:    Vital Signs:  There were no vitals taken for this visit.    ASSESSMENT & PLAN:    1.  Hypertension -  He does not have a BP cuff at home.  He will continue on amlodipine 5mg  daily, carvedilol 12.5mg  BID and lisinopril 20mg  daily.    2.  Tachycardia - event monitor showed no arrhythmias.  His HR is well controlled on carvedilol.    3.  Hypertensive heart disease - he had evidence of G1 diastolic dysfunction on echo but no LVH.  4  CKD stage 3- his creatinine was 2.4 on 03/2018.  This appears to be his new norm.  His highest creatinine was 3.19 on 12/2016.  This is followed by his nephrologist.  5.  Type 2 DM with CKD stage 3.  This is followed by his PCP and nephrologist.  HbA1c was 7.8.  He will continue on Insulin.  5.  COVID-19 Education:The signs and symptoms of COVID-19 were discussed with the patient and how to seek care for testing (follow up with PCP or arrange E-visit).  The importance of social distancing was discussed today.  Patient Risk:   After full review of this patient's clinical status, I feel that they are at least moderate risk at this time.  Time:   Today, I have spent 10 minutes directly with the patient on telephone discussing medical problems including hypertension management, review of renal function, discussion of tachycardia.  We also reviewed the symptoms of COVID 19 and the ways to protect against contracting the  virus with telehealth technology.  I spent an additional 10 minutes reviewing patient's chart including 2D echo and event monitor from 2017 and recent OVnotes from PCP.  Medication Adjustments/Labs and Tests Ordered: Current medicines are reviewed at length with the patient today.  Concerns regarding medicines are outlined above.  Tests Ordered: No orders of the defined types were placed in this encounter.  Medication Changes: No orders of the defined types were placed in this encounter.   Disposition:  Follow up in 1 year(s)  Signed, Fransico Him, MD  07/13/2018 [facility]

## 2018-07-13 NOTE — Telephone Encounter (Signed)
Called and spoke to patient and his daughter to get consent for telephone visit with Dr Radford Pax 07/13/2018 at 3:00pm.    TELEPHONE CALL NOTE  This patient has been deemed a candidate for follow-up tele-health visit to limit community exposure during the Covid-19 pandemic. I spoke with the patient via phone to discuss instructions. This has been outlined on the patient's AVS (dotphrase: hcevisitinfo). The patient was advised to review the section on consent for treatment as well. The patient will receive a phone call 2-3 days prior to their E-Visit at which time consent will be verbally confirmed.   A Virtual Office Visit appointment type has been scheduled for 07/13/2018 at 3:00pm with Dr Radford Pax, with "VIDEO" or "TELEPHONE" in the appointment notes - patient prefers TELEPHONE type.  I have either confirmed the patient is active in MyChart or offered to send sign-up link to phone/email via Mychart icon beside patient's photo. NOT ACTIVE  Marcus Weaver, [place] 07/13/2018 2:51 PM      Virtual Visit Pre-Appointment Phone Call  "(Name), I am calling you today to discuss your upcoming appointment. We are currently trying to limit exposure to the virus that causes COVID-19 by seeing patients at home rather than in the office."  1. "What is the BEST phone number to call the day of the visit?" - include this in appointment notes  2. "Do you have or have access to (through a family member/friend) a smartphone with video capability that we can use for your visit?" a. If yes - list this number in appt notes as "cell" (if different from BEST phone #) and list the appointment type as a VIDEO visit in appointment notes b. If no - list the appointment type as a PHONE visit in appointment notes  3. Confirm consent - "In the setting of the current Covid19 crisis, you are scheduled for a (phone or video) visit with your provider on (date) at (time).  Just as we do with many in-office visits, in order for you to  participate in this visit, we must obtain consent.  If you'd like, I can send this to your mychart (if signed up) or email for you to review.  Otherwise, I can obtain your verbal consent now.  All virtual visits are billed to your insurance company just like a normal visit would be.  By agreeing to a virtual visit, we'd like you to understand that the technology does not allow for your provider to perform an examination, and thus may limit your provider's ability to fully assess your condition. If your provider identifies any concerns that need to be evaluated in person, we will make arrangements to do so.  Finally, though the technology is pretty good, we cannot assure that it will always work on either your or our end, and in the setting of a video visit, we may have to convert it to a phone-only visit.  In either situation, we cannot ensure that we have a secure connection.  Are you willing to proceed?" STAFF: Did the patient verbally acknowledge consent to telehealth visit? Document YES/NO here: YES  4. Advise patient to be prepared - "Two hours prior to your appointment, go ahead and check your blood pressure, pulse, oxygen saturation, and your weight (if you have the equipment to check those) and write them all down. When your visit starts, your provider will ask you for this information. If you have an Apple Watch or Kardia device, please plan to have heart rate information ready on  the day of your appointment. Please have a pen and paper handy nearby the day of the visit as well."  5. Give patient instructions for MyChart download to smartphone OR Doximity/Doxy.me as below if video visit (depending on what platform provider is using)  6. Inform patient they will receive a phone call 15 minutes prior to their appointment time (may be from unknown caller ID) so they should be prepared to answer    TELEPHONE CALL NOTE  Marcus Weaver has been deemed a candidate for a follow-up tele-health visit to  limit community exposure during the Covid-19 pandemic. I spoke with the patient via phone to ensure availability of phone/video source, confirm preferred email & phone number, and discuss instructions and expectations.  I reminded Marcus Weaver to be prepared with any vital sign and/or heart rhythm information that could potentially be obtained via home monitoring, at the time of his visit. I reminded Marcus Weaver to expect a phone call prior to his visit.  [name], [place] 07/13/2018 2:52 PM   INSTRUCTIONS FOR DOWNLOADING THE MYCHART APP TO SMARTPHONE  - The patient must first make sure to have activated MyChart and know their login information - If Apple, go to [employer] and type in MyChart in the search bar and download the app. If Android, ask patient to go to [employer] and type in [place] in the search bar and download the app. The app is free but as with any other app downloads, their phone may require them to verify saved payment information or Apple/Android password.  - The patient will need to then log into the app with their MyChart username and password, and select [facility] as their healthcare provider to link the account. When it is time for your visit, go to the MyChart app, find appointments, and click Begin Video Visit. Be sure to Select Allow for your device to access the Microphone and Camera for your visit. You will then be connected, and your provider will be with you shortly.  **If they have any issues connecting, or need assistance please contact MyChart service desk [phone])**  **If using a computer, in order to ensure the best quality for their visit they will need to use either of the following Internet Browsers: [employer], or Google Chrome**  IF USING DOXIMITY or DOXY.ME - The patient will receive a link just prior to their visit by text.     FULL LENGTH CONSENT FOR TELE-HEALTH VISIT   I hereby voluntarily request, consent and  authorize [facility] and its employed or contracted physicians, physician assistants, nurse practitioners or other licensed health care professionals (the Practitioner), to provide me with telemedicine health care services (the "Services") as deemed necessary by the treating Practitioner. I acknowledge and consent to receive the Services by the Practitioner via telemedicine. I understand that the telemedicine visit will involve communicating with the Practitioner through live audiovisual communication technology and the disclosure of certain medical information by electronic transmission. I acknowledge that I have been given the opportunity to request an in-person assessment or other available alternative prior to the telemedicine visit and am voluntarily participating in the telemedicine visit.  I understand that I have the right to withhold or withdraw my consent to the use of telemedicine in the course of my care at any time, without affecting my right to future care or treatment, and that the Practitioner or I may terminate the telemedicine visit at any time. I understand that I have the  right to inspect all information obtained and/or recorded in the course of the telemedicine visit and may receive copies of available information for a reasonable fee.  I understand that some of the potential risks of receiving the Services via telemedicine include:  Marland Kitchen Delay or interruption in medical evaluation due to technological equipment failure or disruption; . Information transmitted may not be sufficient (e.g. poor resolution of images) to allow for appropriate medical decision making by the Practitioner; and/or  . In rare instances, security protocols could fail, causing a breach of personal health information.  Furthermore, I acknowledge that it is my responsibility to provide information about my medical history, conditions and care that is complete and accurate to the best of my ability. I acknowledge that  Practitioner's advice, recommendations, and/or decision may be based on factors not within their control, such as incomplete or inaccurate data provided by me or distortions of diagnostic images or specimens that may result from electronic transmissions. I understand that the practice of medicine is not an exact science and that Practitioner makes no warranties or guarantees regarding treatment outcomes. I acknowledge that I will receive a copy of this consent concurrently upon execution via email to the email address I last provided but may also request a printed copy by calling the office of [facility].    I understand that my insurance will be billed for this visit.   I have read or had this consent read to me. . I understand the contents of this consent, which adequately explains the benefits and risks of the Services being provided via telemedicine.  . I have been provided ample opportunity to ask questions regarding this consent and the Services and have had my questions answered to my satisfaction. . I give my informed consent for the services to be provided through the use of telemedicine in my medical care  By participating in this telemedicine visit I agree to the above.

## 2018-07-13 NOTE — Patient Instructions (Signed)

## 2018-07-19 ENCOUNTER — Ambulatory Visit (INDEPENDENT_AMBULATORY_CARE_PROVIDER_SITE_OTHER): Payer: Medicare Other | Admitting: Family Medicine

## 2018-07-19 ENCOUNTER — Encounter: Payer: Self-pay | Admitting: Family Medicine

## 2018-07-19 VITALS — Ht 67.0 in | Wt 222.0 lb

## 2018-07-19 DIAGNOSIS — E0822 Diabetes mellitus due to underlying condition with diabetic chronic kidney disease: Secondary | ICD-10-CM

## 2018-07-19 DIAGNOSIS — F0390 Unspecified dementia without behavioral disturbance: Secondary | ICD-10-CM | POA: Diagnosis not present

## 2018-07-19 DIAGNOSIS — E1169 Type 2 diabetes mellitus with other specified complication: Secondary | ICD-10-CM

## 2018-07-19 DIAGNOSIS — I10 Essential (primary) hypertension: Secondary | ICD-10-CM

## 2018-07-19 DIAGNOSIS — E785 Hyperlipidemia, unspecified: Secondary | ICD-10-CM

## 2018-07-19 DIAGNOSIS — Z794 Long term (current) use of insulin: Secondary | ICD-10-CM

## 2018-07-19 DIAGNOSIS — N184 Chronic kidney disease, stage 4 (severe): Secondary | ICD-10-CM

## 2018-07-19 DIAGNOSIS — N183 Chronic kidney disease, stage 3 (moderate): Secondary | ICD-10-CM

## 2018-07-19 NOTE — Progress Notes (Signed)
I have discussed the procedure for the virtual visit with the patient who has given consent to proceed with assessment and treatment.   BETHANY DILLARD, CMA     

## 2018-07-19 NOTE — Progress Notes (Signed)
Virtual Visit via Telephone Note  I connected with Marcus Weaver on 07/19/18 at  2:20 PM EDT by telephone and verified that I am speaking with the correct person using two identifiers.  Location: Patient: Home Provider: Office   I discussed the limitations, risks, security and privacy concerns of performing an evaluation and management service by telephone and the availability of in person appointments. I also discussed with the patient that there may be a patient responsible charge related to this service. The patient expressed understanding and agreed to proceed.  Interactive audio and video telecommunications were attempted between this provider and patient, however failed, due to patient having technical difficulties OR patient did not have access to video capability.  We continued and completed visit with audio only.   History of Present Illness: DM- chronic problem, following w/ Dr Loanne Drilling.  On Humulin.  HTN- chronic problem, no way to check BP at this time.  On Amlodipine 5mg  daily, Carvedilol 6.25mg  BID, Lasix 80mg  daily, Lisinopril 40mg  daily.  Denies CP, SOB above baseline, HAs.  No abd pain, N/V.  No swelling of hands, chronic edema in LEs.  Hyperlipidemia- chronic problem, not currently on medication.  Last lipid panel was 12/2016.  Dementia- ongoing issue for pt.  He did not remember having a telephone visit w/ Dr Radford Pax last week.  Doing well on Seroquel 25mg  nightly.  Pt is no longer wandering per his report.     Observations/Objective: Pt is able to speak clearly, coherently without shortness of breath or increased work of breathing.  AAOx2 (self and place). Thought process is linear but memory deficits noted.  Mood is appropriate.   Assessment and Plan: DM- chronic problem, following w/ Dr Loanne Drilling.  On Humulin.  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  Following w/ Dr Jimmy Footman.  He has recently discussed HD w/ pt and family.  Will continue to follow along.  HTN- chronic problem.  Unable to  check BP today.  Had virtual visit w/ Dr Radford Pax and no med changes were made at that time.  Will follow  Hyperlipidemia- chronic problem.  Pt has not had lipids done since 2018.  Will have pt come for labs to determine if statin is needed  Dementia- ongoing.  Refill provided for Seroquel as pt and daughter report this is working well and he is stable for the time being.  Will continue to follow.    Follow Up Instructions: Labs scheduled   I discussed the assessment and treatment plan with the patient. The patient was provided an opportunity to ask questions and all were answered. The patient agreed with the plan and demonstrated an understanding of the instructions.   The patient was advised to call back or seek an in-person evaluation if the symptoms worsen or if the condition fails to improve as anticipated.   Annye Asa, MD

## 2018-07-24 ENCOUNTER — Other Ambulatory Visit: Payer: Self-pay

## 2018-07-24 ENCOUNTER — Telehealth: Payer: Self-pay | Admitting: Endocrinology

## 2018-07-24 ENCOUNTER — Ambulatory Visit (INDEPENDENT_AMBULATORY_CARE_PROVIDER_SITE_OTHER): Payer: Medicare Other | Admitting: Endocrinology

## 2018-07-24 DIAGNOSIS — E0822 Diabetes mellitus due to underlying condition with diabetic chronic kidney disease: Secondary | ICD-10-CM | POA: Diagnosis not present

## 2018-07-24 DIAGNOSIS — N183 Chronic kidney disease, stage 3 unspecified: Secondary | ICD-10-CM

## 2018-07-24 DIAGNOSIS — Z794 Long term (current) use of insulin: Secondary | ICD-10-CM | POA: Diagnosis not present

## 2018-07-24 MED ORDER — INSULIN LISPRO PROT & LISPRO (75-25 MIX) 100 UNIT/ML KWIKPEN: 55.0000 [IU] | PEN_INJECTOR | Freq: Every day | SUBCUTANEOUS | 11 refills | Status: DC

## 2018-07-24 NOTE — Patient Instructions (Addendum)
I have sent a prescription to your pharmacy, to change the insulin back to pens.   On this type of insulin schedule, you should eat meals on a regular schedule.  If a meal is missed or significantly delayed, your blood sugar could go low.   check your blood sugar twice a day.  vary the time of day when you check, between before the 3 meals, and at bedtime.  also check if you have symptoms of your blood sugar being too high or too low.  please keep a record of the readings and bring it to your next appointment here.  You can write it on any piece of paper.  please call us sooner if your blood sugar goes below 70, or if you have a lot of readings over 200.   Please come back for a follow-up appointment in 2 months.

## 2018-07-24 NOTE — Progress Notes (Signed)
Subjective:    Patient ID: Marcus Weaver, male    DOB: 1930/06/16, 85 y.o.   MRN: [id number]  HPI telehealth visit today via Phone x 5 minutes.    Alternatives to telehealth are presented to this patient, and the patient agrees to the telehealth visit. Pt is advised of the cost of the visit, and agrees to this, also.   Patient is at home, and I am at the office.   Persons attending the telehealth visit: the patient, son, and I.  Pt returns for f/u of diabetes mellitus: DM type: Insulin-requiring type 2 Dx'ed: 2009.  Complications: renal insufficiency, DR, and polyneuropathy.  Therapy: insulin since 2014 (and metformin).   DKA: never.   Severe hypoglycemia: twice (2011 and 2017).   Pancreatitis: never.   Other: he was changed to QD insulin, after poor results with multiple daily injections.  dtr draws up insulin ahead of time; lantus was changed to 70/30, due to renal failure.   Interval history: pt states cbg's vary from 80-180.  There is no trend throughout the day.  pt states he feels well in general.  He wants to go back to insulin pens.   Past Medical History:  Diagnosis Date  . Abnormal EKG 08/14/2015  . CKD (chronic kidney disease), stage II   . Diabetes mellitus    type2  . Hyperlipidemia   . Hypertension   . Hypertensive heart disease 08/14/2015  . Tachycardia    a. HR 132 at endo OV 06/2015, no EKG at that time.  Marland Kitchen UTI (lower urinary tract infection)     Past Surgical History:  Procedure Laterality Date  . CATARACT EXTRACTION      Social History   Socioeconomic History  . Marital status: Single    Spouse name: Not on file  . Number of children: Not on file  . Years of education: Not on file  . Highest education level: Not on file  Occupational History  . Occupation: retired  Scientific laboratory technician  . Financial resource strain: Not on file  . Food insecurity:    Worry: Not on file    Inability: Not on file  . Transportation needs:    Medical: Not on file   Non-medical: Not on file  Tobacco Use  . Smoking status: Former Smoker    Types: Cigarettes    Last attempt to quit: 06/25/2015    Years since quitting: 3.0  . Smokeless tobacco: Never Used  . Tobacco comment: smokes 3 or 4 cigarettes /day  Substance and Sexual Activity  . Alcohol use: No  . Drug use: No  . Sexual activity: Not on file  Lifestyle  . Physical activity:    Days per week: Not on file    Minutes per session: Not on file  . Stress: Not on file  Relationships  . Social connections:    Talks on phone: Not on file    Gets together: Not on file    Attends religious service: Not on file    Active member of club or organization: Not on file    Attends meetings of clubs or organizations: Not on file    Relationship status: Not on file  . Intimate partner violence:    Fear of current or ex partner: Not on file    Emotionally abused: Not on file    Physically abused: Not on file    Forced sexual activity: Not on file  Other Topics Concern  . Not on file  Social  History Narrative  . Not on file    Current Outpatient Medications on File Prior to Visit  Medication Sig Dispense Refill  . amLODipine (NORVASC) 5 MG tablet Take 1 tablet (5 mg total) by mouth daily. Please make an overdue appt with Dr. Radford Pax before anymore refills. 3rd and Final 15 tablet 0  . calcitRIOL (ROCALTROL) 0.25 MCG capsule Take 1 capsule by mouth daily.    . carvedilol (COREG) 6.25 MG tablet Take 1 tablet (6.25 mg total) by mouth 2 (two) times daily. (Patient taking differently: Take 12.5 mg by mouth 2 (two) times daily. ) 60 tablet 11  . furosemide (LASIX) 80 MG tablet Take 2 tablets in the morning and 1 tablet at noon  10  . glucose blood (ONETOUCH VERIO) test strip 1 each by Other route 2 (two) times daily. And lancets 2/day 200 each 3  . lisinopril (PRINIVIL,ZESTRIL) 40 MG tablet 1/2 tab daily  5  . ONETOUCH VERIO test strip USE TWICE DAILY 200 each 3  . QUEtiapine (SEROQUEL) 25 MG tablet TAKE 1  TABLET(25 MG) BY MOUTH AT BEDTIME 30 tablet 0   No current facility-administered medications on file prior to visit.     No Known Allergies  Family History  Problem Relation Age of Onset  . Diabetes Other        1st degree relative  . Hypertension Other   . Kidney disease Mother   . Cancer Neg Hx    Review of Systems He denies hypoglycemia.      Objective:   Physical Exam  Lab Results  Component Value Date   CREATININE 3.19 (H) 01/06/2017   BUN 67 (H) 01/06/2017   NA 143 01/06/2017   K 4.5 01/06/2017   CL 103 01/06/2017   CO2 31 01/06/2017      Assessment & Plan:  Insulin-requiring type 2 DM, with renal failure apparently well-controlled.    Patient Instructions  I have sent a prescription to your pharmacy, to change the insulin back to pens.   On this type of insulin schedule, you should eat meals on a regular schedule.  If a meal is missed or significantly delayed, your blood sugar could go low.   check your blood sugar twice a day.  vary the time of day when you check, between before the 3 meals, and at bedtime.  also check if you have symptoms of your blood sugar being too high or too low.  please keep a record of the readings and bring it to your next appointment here.  You can write it on any piece of paper.  please call us sooner if your blood sugar goes below 70, or if you have a lot of readings over 200.   Please come back for a follow-up appointment in 2 months.

## 2018-07-24 NOTE — Telephone Encounter (Signed)
Pt is scheduled today @ 49 MD to pt. I assume you will address dtr's question at that time.

## 2018-07-24 NOTE — Telephone Encounter (Signed)
Patients daughter called and converted her dads appointment but also wanted to advise for the appointment, she wanted to see if he could get a Flexpen instead of vials for his insulin.  Please Advise, Thanks

## 2018-07-27 ENCOUNTER — Other Ambulatory Visit: Payer: Medicare Other

## 2018-07-31 ENCOUNTER — Other Ambulatory Visit (INDEPENDENT_AMBULATORY_CARE_PROVIDER_SITE_OTHER): Payer: Medicare Other

## 2018-07-31 DIAGNOSIS — Z794 Long term (current) use of insulin: Secondary | ICD-10-CM

## 2018-07-31 DIAGNOSIS — E0822 Diabetes mellitus due to underlying condition with diabetic chronic kidney disease: Secondary | ICD-10-CM | POA: Diagnosis not present

## 2018-07-31 DIAGNOSIS — E1169 Type 2 diabetes mellitus with other specified complication: Secondary | ICD-10-CM

## 2018-07-31 DIAGNOSIS — I10 Essential (primary) hypertension: Secondary | ICD-10-CM | POA: Diagnosis not present

## 2018-07-31 DIAGNOSIS — N183 Chronic kidney disease, stage 3 unspecified: Secondary | ICD-10-CM

## 2018-07-31 DIAGNOSIS — E785 Hyperlipidemia, unspecified: Secondary | ICD-10-CM

## 2018-07-31 DIAGNOSIS — N184 Chronic kidney disease, stage 4 (severe): Secondary | ICD-10-CM

## 2018-07-31 LAB — CBC WITH DIFFERENTIAL/PLATELET
Basophils Absolute: 0.2 10*3/uL — ABNORMAL HIGH (ref 0.0–0.1)
Basophils Relative: 2.2 % (ref 0.0–3.0)
Eosinophils Absolute: 0.4 10*3/uL (ref 0.0–0.7)
Eosinophils Relative: 4.9 % (ref 0.0–5.0)
HCT: 34.6 % — ABNORMAL LOW (ref 39.0–52.0)
Hemoglobin: 11.7 g/dL — ABNORMAL LOW (ref 13.0–17.0)
Lymphocytes Relative: 28 % (ref 12.0–46.0)
Lymphs Abs: 2.1 10*3/uL (ref 0.7–4.0)
MCHC: 33.8 g/dL (ref 30.0–36.0)
MCV: 87.4 fl (ref 78.0–100.0)
Monocytes Absolute: 0.6 10*3/uL (ref 0.1–1.0)
Monocytes Relative: 8.2 % (ref 3.0–12.0)
Neutro Abs: 4.3 10*3/uL (ref 1.4–7.7)
Neutrophils Relative %: 56.7 % (ref 43.0–77.0)
Platelets: 197 10*3/uL (ref 150.0–400.0)
RBC: 3.96 Mil/uL — ABNORMAL LOW (ref 4.22–5.81)
RDW: 13.6 % (ref 11.5–15.5)
WBC: 7.6 10*3/uL (ref 4.0–10.5)

## 2018-07-31 LAB — LIPID PANEL
Cholesterol: 172 mg/dL (ref 0–200)
HDL: 32.7 mg/dL — ABNORMAL LOW (ref >39.00)
LDL Cholesterol: 113 mg/dL — ABNORMAL HIGH (ref 0–99)
NonHDL: 139.11
Total CHOL/HDL Ratio: 5
Triglycerides: 132 mg/dL (ref 0.0–149.0)
VLDL: 26.4 mg/dL (ref 0.0–40.0)

## 2018-07-31 LAB — HEPATIC FUNCTION PANEL
ALT: 9 U/L (ref 0–53)
AST: 12 U/L (ref 0–37)
Albumin: 3.9 g/dL (ref 3.5–5.2)
Alkaline Phosphatase: 62 U/L (ref 39–117)
Bilirubin, Direct: 0.1 mg/dL (ref 0.0–0.3)
Total Bilirubin: 0.4 mg/dL (ref 0.2–1.2)
Total Protein: 7.2 g/dL (ref 6.0–8.3)

## 2018-07-31 LAB — BASIC METABOLIC PANEL
BUN: 49 mg/dL — ABNORMAL HIGH (ref 6–23)
CO2: 27 mEq/L (ref 19–32)
Calcium: 9.1 mg/dL (ref 8.4–10.5)
Chloride: 99 mEq/L (ref 96–112)
Creatinine, Ser: 3.52 mg/dL — ABNORMAL HIGH (ref 0.40–1.50)
GFR: 20.05 mL/min — ABNORMAL LOW (ref >60.00)
Glucose, Bld: 459 mg/dL — ABNORMAL HIGH (ref 70–99)
Potassium: 4.4 mEq/L (ref 3.5–5.1)
Sodium: 136 mEq/L (ref 135–145)

## 2018-07-31 LAB — TSH: TSH: 4.39 u[IU]/mL (ref 0.35–4.50)

## 2018-08-01 ENCOUNTER — Encounter: Payer: Self-pay | Admitting: General Practice

## 2018-08-04 ENCOUNTER — Other Ambulatory Visit: Payer: Self-pay

## 2018-08-04 ENCOUNTER — Encounter: Payer: Self-pay | Admitting: Podiatry

## 2018-08-04 ENCOUNTER — Ambulatory Visit: Payer: Medicare Other | Admitting: Podiatry

## 2018-08-04 VITALS — Temp 97.3°F

## 2018-08-04 DIAGNOSIS — E1151 Type 2 diabetes mellitus with diabetic peripheral angiopathy without gangrene: Secondary | ICD-10-CM

## 2018-08-04 DIAGNOSIS — L84 Corns and callosities: Secondary | ICD-10-CM | POA: Diagnosis not present

## 2018-08-04 DIAGNOSIS — B351 Tinea unguium: Secondary | ICD-10-CM | POA: Diagnosis not present

## 2018-08-04 NOTE — Patient Instructions (Signed)
Diabetes Mellitus and Foot Care  Foot care is an important part of your health, especially when you have diabetes. Diabetes may cause you to have problems because of poor blood flow (circulation) to your feet and legs, which can cause your skin to:   Become thinner and drier.   Break more easily.   Heal more slowly.   Peel and crack.  You may also have nerve damage (neuropathy) in your legs and feet, causing decreased feeling in them. This means that you may not notice minor injuries to your feet that could lead to more serious problems. Noticing and addressing any potential problems early is the best way to prevent future foot problems.  How to care for your feet  Foot hygiene   Wash your feet daily with warm water and mild soap. Do not use hot water. Then, pat your feet and the areas between your toes until they are completely dry. Do not soak your feet as this can dry your skin.   Trim your toenails straight across. Do not dig under them or around the cuticle. File the edges of your nails with an emery board or nail file.   Apply a moisturizing lotion or petroleum jelly to the skin on your feet and to dry, brittle toenails. Use lotion that does not contain alcohol and is unscented. Do not apply lotion between your toes.  Shoes and socks   Wear clean socks or stockings every day. Make sure they are not too tight. Do not wear knee-high stockings since they may decrease blood flow to your legs.   Wear shoes that fit properly and have enough cushioning. Always look in your shoes before you put them on to be sure there are no objects inside.   To break in new shoes, wear them for just a few hours a day. This prevents injuries on your feet.  Wounds, scrapes, corns, and calluses   Check your feet daily for blisters, cuts, bruises, sores, and redness. If you cannot see the bottom of your feet, use a mirror or ask someone for help.   Do not cut corns or calluses or try to remove them with medicine.   If you  find a minor scrape, cut, or break in the skin on your feet, keep it and the skin around it clean and dry. You may clean these areas with mild soap and water. Do not clean the area with peroxide, alcohol, or iodine.   If you have a wound, scrape, corn, or callus on your foot, look at it several times a day to make sure it is healing and not infected. Check for:  ? Redness, swelling, or pain.  ? Fluid or blood.  ? Warmth.  ? Pus or a bad smell.  General instructions   Do not cross your legs. This may decrease blood flow to your feet.   Do not use heating pads or hot water bottles on your feet. They may burn your skin. If you have lost feeling in your feet or legs, you may not know this is happening until it is too late.   Protect your feet from hot and cold by wearing shoes, such as at the beach or on hot pavement.   Schedule a complete foot exam at least once a year (annually) or more often if you have foot problems. If you have foot problems, report any cuts, sores, or bruises to your health care provider immediately.  Contact a health care provider if:     You have a medical condition that increases your risk of infection and you have any cuts, sores, or bruises on your feet.   You have an injury that is not healing.   You have redness on your legs or feet.   You feel burning or tingling in your legs or feet.   You have pain or cramps in your legs and feet.   Your legs or feet are numb.   Your feet always feel cold.   You have pain around a toenail.  Get help right away if:   You have a wound, scrape, corn, or callus on your foot and:  ? You have pain, swelling, or redness that gets worse.  ? You have fluid or blood coming from the wound, scrape, corn, or callus.  ? Your wound, scrape, corn, or callus feels warm to the touch.  ? You have pus or a bad smell coming from the wound, scrape, corn, or callus.  ? You have a fever.  ? You have a red line going up your leg.  Summary   Check your feet every day  for cuts, sores, red spots, swelling, and blisters.   Moisturize feet and legs daily.   Wear shoes that fit properly and have enough cushioning.   If you have foot problems, report any cuts, sores, or bruises to your health care provider immediately.   Schedule a complete foot exam at least once a year (annually) or more often if you have foot problems.  This information is not intended to replace advice given to you by your health care provider. Make sure you discuss any questions you have with your health care provider.  Document Released: 03/05/2000 Document Revised: 04/20/2017 Document Reviewed: 04/09/2016  Elsevier Interactive Patient Education  2019 [employer]

## 2018-08-08 ENCOUNTER — Telehealth: Payer: Self-pay | Admitting: Endocrinology

## 2018-08-08 NOTE — Telephone Encounter (Signed)
please contact patient's son: The prescribed insulin (humalog 75/25) is correct.  Please increase to 60 units with breakfast.  Needs f/u next week.

## 2018-08-08 NOTE — Telephone Encounter (Signed)
Called dtr and made her aware. Scheduled for VV 08/16/18 @ 315

## 2018-08-09 ENCOUNTER — Other Ambulatory Visit: Payer: Self-pay | Admitting: Family Medicine

## 2018-08-10 NOTE — Telephone Encounter (Signed)
This was filled 07/11/18 #30 with 0 ok to refill with refills?

## 2018-08-14 ENCOUNTER — Encounter: Payer: Self-pay | Admitting: Podiatry

## 2018-08-14 NOTE — Progress Notes (Signed)
Subjective: Marcus Weaver is a 85 y.o. y.o. male who presents for preventative diabetic foot care today for follow up of  painful, discolored, thick toenails and painful corn 5th digit which interfere with daily activities. Pain is aggravated when wearing enclosed shoe gear. Pain is relieved with periodic professional debridement.  [name], MD is his PCP and last visit was one week ago.   Current Outpatient Medications:  .  amLODipine (NORVASC) 5 MG tablet, Take 1 tablet (5 mg total) by mouth daily. Please make an overdue appt with Dr. Radford Pax before anymore refills. 3rd and Final, Disp: 15 tablet, Rfl: 0 .  calcitRIOL (ROCALTROL) 0.25 MCG capsule, Take 1 capsule by mouth daily., Disp: , Rfl:  .  carvedilol (COREG) 6.25 MG tablet, Take 1 tablet (6.25 mg total) by mouth 2 (two) times daily. (Patient taking differently: Take 12.5 mg by mouth 2 (two) times daily. ), Disp: 60 tablet, Rfl: 11 .  furosemide (LASIX) 80 MG tablet, Take 2 tablets in the morning and 1 tablet at noon, Disp: , Rfl: 10 .  glucose blood (ONETOUCH VERIO) test strip, 1 each by Other route 2 (two) times daily. And lancets 2/day, Disp: 200 each, Rfl: 3 .  Insulin Lispro Prot & Lispro (HUMALOG MIX 75/25 KWIKPEN) (75-25) 100 UNIT/ML Kwikpen, Inject 55 Units into the skin daily with breakfast., Disp: 10 pen, Rfl: 11 .  lisinopril (PRINIVIL,ZESTRIL) 40 MG tablet, 1/2 tab daily, Disp: , Rfl: 5 .  ONETOUCH VERIO test strip, USE TWICE DAILY, Disp: 200 each, Rfl: 3 .  QUEtiapine (SEROQUEL) 25 MG tablet, TAKE 1 TABLET(25 MG) BY MOUTH AT BEDTIME, Disp: 30 tablet, Rfl: 6  No Known Allergies  Objective: Vascular Examination: Capillary refill time <4 seconds x 10 digits.  Dorsalis pedis pulses faintly palpable b/l.  Posterior tibial pulses faintly palpable b/l.  No digital hair x 10 digits.  Skin temperature warm to cool b/l.  Trace pedal edema noted BLE. No pain with calf compression b/l.  No acute digital ischemic  changes noted 1-5 b/l.  Dermatological Examination: Skin thin and atrophic b/l.  Toenails 1-5 b/l discolored, thick, dystrophic with subungual debris and pain with palpation to nailbeds due to thickness of nails.  Hyperkeratotic lesion noted lateral aspect 5th digit nailplate consistent with Lister's corn.  No erythema, no edema, no drainage, no flocculence noted.   Musculoskeletal: Muscle strength 5/5 to all LE muscle groups.  HAV with bunion deformity b/l.  Hammertoe left 2nd digit.  Utilized walker for mobility.  Neurological: Sensation intact with 10 gram monofilament.  Vibratory sensation intact b/l.  Assessment: 1. Painful onychomycosis toenails 1-5 b/l 2. 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 NIDDM with Peripheral arterial disease  Plan: 1. Discuss diabetic foot care principles. Literature dispensed. 2. Toenails 1-5 b/l were debrided in length and girth without iatrogenic bleeding. 3. Hyperkeratotic lesion(s)  Pared right 5th digit with sterile chisel blade and gently smoothed with burr. 4. Patient to continue soft, supportive shoe gear daily. 5. Patient to report any pedal injuries to medical professional immediately. 6. Follow up 3 months.  7. Patient/POA to call should there be a concern in the interim.

## 2018-08-16 ENCOUNTER — Ambulatory Visit (INDEPENDENT_AMBULATORY_CARE_PROVIDER_SITE_OTHER): Payer: Medicare Other | Admitting: Endocrinology

## 2018-08-16 ENCOUNTER — Other Ambulatory Visit: Payer: Self-pay

## 2018-08-16 DIAGNOSIS — R000 Tachycardia, unspecified: Secondary | ICD-10-CM | POA: Diagnosis not present

## 2018-08-16 DIAGNOSIS — N183 Chronic kidney disease, stage 3 (moderate): Secondary | ICD-10-CM

## 2018-08-16 DIAGNOSIS — E0822 Diabetes mellitus due to underlying condition with diabetic chronic kidney disease: Secondary | ICD-10-CM

## 2018-08-16 DIAGNOSIS — Z794 Long term (current) use of insulin: Secondary | ICD-10-CM

## 2018-08-16 MED ORDER — INSULIN LISPRO PROT & LISPRO (75-25 MIX) 100 UNIT/ML KWIKPEN: 60.0000 [IU] | PEN_INJECTOR | Freq: Every day | SUBCUTANEOUS | 11 refills | Status: DC

## 2018-08-16 NOTE — Patient Instructions (Signed)
Please increase the insulin to 60 units with breakfast.   check your blood sugar twice a day.  vary the time of day when you check, between before the 3 meals, and at bedtime.  also check if you have symptoms of your blood sugar being too high or too low.  please keep a record of the readings and bring it to your next appointment here (or you can bring the meter itself).  You can write it on any piece of paper.  please call us sooner if your blood sugar goes below 70, or if you have a lot of readings over 200. Please come back for a follow-up appointment in 1-2 months.

## 2018-08-16 NOTE — Progress Notes (Signed)
Subjective:    Patient ID: Marcus Weaver, male    DOB: Jan 25, 1931, 85 y.o.   MRN: [id number]  HPI telehealth visit today via doxy video visit.  Alternatives to telehealth are presented to this patient, and the patient agrees to the telehealth visit.  Pt is advised of the cost of the visit, and agrees to this, also.   Patient is at home, and I am at the office.   Persons attending the telehealth visit: the patient, dtr, and I Pt returns for f/u of diabetes mellitus: DM type: Insulin-requiring type 2 Dx'ed: 2009.  Complications: renal insufficiency, DR, and polyneuropathy.  Therapy: insulin since 2014 (and metformin).   DKA: never.   Severe hypoglycemia: twice (2011 and 2017).   Pancreatitis: never.   Other: he was changed to QD insulin, after poor results with multiple daily injections.  dtr draws up insulin ahead of time; lantus was changed to 70/30, due to renal failure.   Interval history: pt states cbg's vary from 149-329.  There is no trend throughout the day.  pt states he feels well in general.   Past Medical History:  Diagnosis Date  . Abnormal EKG 08/14/2015  . CKD (chronic kidney disease), stage II   . Diabetes mellitus    type2  . Hyperlipidemia   . Hypertension   . Hypertensive heart disease 08/14/2015  . Tachycardia    a. HR 132 at endo OV 06/2015, no EKG at that time.  Marland Kitchen UTI (lower urinary tract infection)     Past Surgical History:  Procedure Laterality Date  . CATARACT EXTRACTION      Social History   Socioeconomic History  . Marital status: Single    Spouse name: Not on file  . Number of children: Not on file  . Years of education: Not on file  . Highest education level: Not on file  Occupational History  . Occupation: retired  Scientific laboratory technician  . Financial resource strain: Not on file  . Food insecurity:    Worry: Not on file    Inability: Not on file  . Transportation needs:    Medical: Not on file    Non-medical: Not on file  Tobacco Use  .  Smoking status: Former Smoker    Types: Cigarettes    Last attempt to quit: 06/25/2015    Years since quitting: 3.1  . Smokeless tobacco: Never Used  . Tobacco comment: smokes 3 or 4 cigarettes /day  Substance and Sexual Activity  . Alcohol use: No  . Drug use: No  . Sexual activity: Not on file  Lifestyle  . Physical activity:    Days per week: Not on file    Minutes per session: Not on file  . Stress: Not on file  Relationships  . Social connections:    Talks on phone: Not on file    Gets together: Not on file    Attends religious service: Not on file    Active member of club or organization: Not on file    Attends meetings of clubs or organizations: Not on file    Relationship status: Not on file  . Intimate partner violence:    Fear of current or ex partner: Not on file    Emotionally abused: Not on file    Physically abused: Not on file    Forced sexual activity: Not on file  Other Topics Concern  . Not on file  Social History Narrative  . Not on file  Current Outpatient Medications on File Prior to Visit  Medication Sig Dispense Refill  . amLODipine (NORVASC) 5 MG tablet Take 1 tablet (5 mg total) by mouth daily. Please make an overdue appt with Dr. Radford Pax before anymore refills. 3rd and Final 15 tablet 0  . calcitRIOL (ROCALTROL) 0.25 MCG capsule Take 1 capsule by mouth daily.    . carvedilol (COREG) 6.25 MG tablet Take 1 tablet (6.25 mg total) by mouth 2 (two) times daily. (Patient taking differently: Take 12.5 mg by mouth 2 (two) times daily. ) 60 tablet 11  . furosemide (LASIX) 80 MG tablet Take 2 tablets in the morning and 1 tablet at noon  10  . glucose blood (ONETOUCH VERIO) test strip 1 each by Other route 2 (two) times daily. And lancets 2/day 200 each 3  . lisinopril (PRINIVIL,ZESTRIL) 40 MG tablet 1/2 tab daily  5  . ONETOUCH VERIO test strip USE TWICE DAILY 200 each 3  . QUEtiapine (SEROQUEL) 25 MG tablet TAKE 1 TABLET(25 MG) BY MOUTH AT BEDTIME 30 tablet  6   No current facility-administered medications on file prior to visit.     No Known Allergies  Family History  Problem Relation Age of Onset  . Diabetes Other        1st degree relative  . Hypertension Other   . Kidney disease Mother   . Cancer Neg Hx      Review of Systems He denies hypoglycemia    Objective:   Physical Exam      Assessment & Plan:  Insulin-requiring type 2 DM, with DR: she needs increased rx.   Renal insuff: in this setting, she needs a fast-acting qd insulin.  Frail elderly state: in this setting, he is not a candidate for aggressive glycemic control.    Patient Instructions  Please increase the insulin to 60 units with breakfast.   check your blood sugar twice a day.  vary the time of day when you check, between before the 3 meals, and at bedtime.  also check if you have symptoms of your blood sugar being too high or too low.  please keep a record of the readings and bring it to your next appointment here (or you can bring the meter itself).  You can write it on any piece of paper.  please call us sooner if your blood sugar goes below 70, or if you have a lot of readings over 200. Please come back for a follow-up appointment in 1-2 months.

## 2018-08-18 ENCOUNTER — Other Ambulatory Visit: Payer: Self-pay | Admitting: Endocrinology

## 2018-11-03 ENCOUNTER — Other Ambulatory Visit: Payer: Self-pay

## 2018-11-03 ENCOUNTER — Encounter: Payer: Self-pay | Admitting: Podiatry

## 2018-11-03 ENCOUNTER — Ambulatory Visit (INDEPENDENT_AMBULATORY_CARE_PROVIDER_SITE_OTHER): Payer: Medicare Other | Admitting: Podiatry

## 2018-11-03 VITALS — Temp 97.7°F

## 2018-11-03 DIAGNOSIS — B351 Tinea unguium: Secondary | ICD-10-CM | POA: Diagnosis not present

## 2018-11-03 DIAGNOSIS — E1151 Type 2 diabetes mellitus with diabetic peripheral angiopathy without gangrene: Secondary | ICD-10-CM | POA: Diagnosis not present

## 2018-11-03 DIAGNOSIS — L84 Corns and callosities: Secondary | ICD-10-CM

## 2018-11-03 NOTE — Patient Instructions (Signed)
Diabetes Mellitus and Foot Care Foot care is an important part of your health, especially when you have diabetes. Diabetes may cause you to have problems because of poor blood flow (circulation) to your feet and legs, which can cause your skin to:  Become thinner and drier.  Break more easily.  Heal more slowly.  Peel and crack. You may also have nerve damage (neuropathy) in your legs and feet, causing decreased feeling in them. This means that you may not notice minor injuries to your feet that could lead to more serious problems. Noticing and addressing any potential problems early is the best way to prevent future foot problems. How to care for your feet Foot hygiene  Wash your feet daily with warm water and mild soap. Do not use hot water. Then, pat your feet and the areas between your toes until they are completely dry. Do not soak your feet as this can dry your skin.  Trim your toenails straight across. Do not dig under them or around the cuticle. File the edges of your nails with an emery board or nail file.  Apply a moisturizing lotion or petroleum jelly to the skin on your feet and to dry, brittle toenails. Use lotion that does not contain alcohol and is unscented. Do not apply lotion between your toes. Shoes and socks  Wear clean socks or stockings every day. Make sure they are not too tight. Do not wear knee-high stockings since they may decrease blood flow to your legs.  Wear shoes that fit properly and have enough cushioning. Always look in your shoes before you put them on to be sure there are no objects inside.  To break in new shoes, wear them for just a few hours a day. This prevents injuries on your feet. Wounds, scrapes, corns, and calluses  Check your feet daily for blisters, cuts, bruises, sores, and redness. If you cannot see the bottom of your feet, use a mirror or ask someone for help.  Do not cut corns or calluses or try to remove them with medicine.  If you  find a minor scrape, cut, or break in the skin on your feet, keep it and the skin around it clean and dry. You may clean these areas with mild soap and water. Do not clean the area with peroxide, alcohol, or iodine.  If you have a wound, scrape, corn, or callus on your foot, look at it several times a day to make sure it is healing and not infected. Check for: ? Redness, swelling, or pain. ? Fluid or blood. ? Warmth. ? Pus or a bad smell. General instructions  Do not cross your legs. This may decrease blood flow to your feet.  Do not use heating pads or hot water bottles on your feet. They may burn your skin. If you have lost feeling in your feet or legs, you may not know this is happening until it is too late.  Protect your feet from hot and cold by wearing shoes, such as at the beach or on hot pavement.  Schedule a complete foot exam at least once a year (annually) or more often if you have foot problems. If you have foot problems, report any cuts, sores, or bruises to your health care provider immediately. Contact a health care provider if:  You have a medical condition that increases your risk of infection and you have any cuts, sores, or bruises on your feet.  You have an injury that is not   healing.  You have redness on your legs or feet.  You feel burning or tingling in your legs or feet.  You have pain or cramps in your legs and feet.  Your legs or feet are numb.  Your feet always feel cold.  You have pain around a toenail. Get help right away if:  You have a wound, scrape, corn, or callus on your foot and: ? You have pain, swelling, or redness that gets worse. ? You have fluid or blood coming from the wound, scrape, corn, or callus. ? Your wound, scrape, corn, or callus feels warm to the touch. ? You have pus or a bad smell coming from the wound, scrape, corn, or callus. ? You have a fever. ? You have a red line going up your leg. Summary  Check your feet every day  for cuts, sores, red spots, swelling, and blisters.  Moisturize feet and legs daily.  Wear shoes that fit properly and have enough cushioning.  If you have foot problems, report any cuts, sores, or bruises to your health care provider immediately.  Schedule a complete foot exam at least once a year (annually) or more often if you have foot problems. This information is not intended to replace advice given to you by your health care provider. Make sure you discuss any questions you have with your health care provider. Document Released: 03/05/2000 Document Revised: 04/20/2017 Document Reviewed: 04/09/2016 Elsevier Patient Education  2020 [employer]   Onychomycosis/Fungal Toenails  WHAT IS IT? An infection that lies within the keratin of your nail plate that is caused by a fungus.  WHY ME? Fungal infections affect all ages, sexes, races, and creeds.  There may be many factors that predispose you to a fungal infection such as age, coexisting medical conditions such as diabetes, or an autoimmune disease; stress, medications, fatigue, genetics, etc.  Bottom line: fungus thrives in a warm, moist environment and your shoes offer such a location.  IS IT CONTAGIOUS? Theoretically, yes.  You do not want to share shoes, nail clippers or files with someone who has fungal toenails.  Walking around barefoot in the same room or sleeping in the same bed is unlikely to transfer the organism.  It is important to realize, however, that fungus can spread easily from one nail to the next on the same foot.  HOW DO WE TREAT THIS?  There are several ways to treat this condition.  Treatment may depend on many factors such as age, medications, pregnancy, liver and kidney conditions, etc.  It is best to ask your doctor which options are available to you.  1. No treatment.   Unlike many other medical concerns, you can live with this condition.  However for many people this can be a painful condition and may lead to  ingrown toenails or a bacterial infection.  It is recommended that you keep the nails cut short to help reduce the amount of fungal nail. 2. Topical treatment.  These range from herbal remedies to prescription strength nail lacquers.  About 40-50% effective, topicals require twice daily application for approximately 9 to 12 months or until an entirely new nail has grown out.  The most effective topicals are medical grade medications available through physicians offices. 3. Oral antifungal medications.  With an 80-90% cure rate, the most common oral medication requires 3 to 4 months of therapy and stays in your system for a year as the new nail grows out.  Oral antifungal medications do require   blood work to make sure it is a safe drug for you.  A liver function panel will be performed prior to starting the medication and after the first month of treatment.  It is important to have the blood work performed to avoid any harmful side effects.  In general, this medication safe but blood work is required. 4. Laser Therapy.  This treatment is performed by applying a specialized laser to the affected nail plate.  This therapy is noninvasive, fast, and non-painful.  It is not covered by insurance and is therefore, out of pocket.  The results have been very good with a 80-95% cure rate.  [facility] is the only practice in the area to offer this therapy. 5. Permanent Nail Avulsion.  Removing the entire nail so that a new nail will not grow back. 

## 2018-11-13 NOTE — Progress Notes (Signed)
Subjective:  Marcus Weaver presents to clinic today for preventative diabetic foot care. He presents with h/o cc of  painful, thick, discolored, elongated toenails 1-5 b/l and corns that become tender and cannot cut because of thickness.  Pain is aggravated when wearing enclosed shoe gear.  [name], MD is his PCP.    Current Outpatient Medications:  .  amLODipine (NORVASC) 5 MG tablet, Take 1 tablet (5 mg total) by mouth daily. Please make an overdue appt with Dr. Radford Pax before anymore refills. 3rd and Final, Disp: 15 tablet, Rfl: 0 .  calcitRIOL (ROCALTROL) 0.25 MCG capsule, Take 1 capsule by mouth daily., Disp: , Rfl:  .  carvedilol (COREG) 6.25 MG tablet, Take 1 tablet (6.25 mg total) by mouth 2 (two) times daily. (Patient taking differently: Take 12.5 mg by mouth 2 (two) times daily. ), Disp: 60 tablet, Rfl: 11 .  furosemide (LASIX) 80 MG tablet, Take 2 tablets in the morning and 1 tablet at noon, Disp: , Rfl: 10 .  glucose blood (ONETOUCH VERIO) test strip, 1 each by Other route 2 (two) times daily. And lancets 2/day, Disp: 200 each, Rfl: 3 .  Insulin Lispro Prot & Lispro (HUMALOG MIX 75/25 KWIKPEN) (75-25) 100 UNIT/ML Kwikpen, Inject 60 Units into the skin daily with breakfast., Disp: 10 pen, Rfl: 11 .  lisinopril (PRINIVIL,ZESTRIL) 40 MG tablet, 1/2 tab daily, Disp: , Rfl: 5 .  lisinopril (ZESTRIL) 20 MG tablet, TK 1 T PO D, Disp: , Rfl:  .  ONETOUCH VERIO test strip, USE AS DIRECTED TWICE DAILY, Disp: 100 each, Rfl: 2 .  QUEtiapine (SEROQUEL) 25 MG tablet, TAKE 1 TABLET(25 MG) BY MOUTH AT BEDTIME, Disp: 30 tablet, Rfl: 6   No Known Allergies   Objective: Vitals:   11/03/18 0920  Temp: 97.7 F (36.5 C)    Physical Examination:  Vascular Examination: Capillary refill time <4 seconds x 10 digits.  Faintly palpable DP/PT pulses b/l.  Digital hair absent b/l.  Trace edema noted b/l.  Skin temperature gradient warm to cool b/l.  No signs of ischemia noted b/l  LE.  Dermatological Examination: Skin thin and atrophic b/l.  No open wounds b/l.  No interdigital macerations noted b/l.  Elongated, thick, discolored brittle toenails with subungual debris and pain on dorsal palpation of nailbeds 1-5 b/l.  Hyperkeratotic lesions lateral nail border b/l 5th digits. No erythema, no edema, no drainage, no flocculence noted.  Musculoskeletal Examination: Muscle strength 5/5 to all muscle groups b/l.  HAV with bunion b/l.  Hammertoe left 2nd digit.  Walker used for mobility assistance.  No pain, crepitus or joint discomfort with active/passive ROM.  Neurological Examination: Sensation intact 5/5 b/l with 10 gram monofilament.  Assessment: Mycotic nail infection with pain 1-5 b/l Lister's corn b/l 5th digits NIDDM with PAD (Class findings Q9)                                        Plan: 1. Toenails 1-5 b/l were debrided in length and girth without iatrogenic laceration. 2. Corn(s) b/l 5th digits pared utilizing sterile scalpel blade without incident. 3. Continue soft, supportive shoe gear daily. 4. Report any pedal injuries to medical professional. 5. Follow up 3 months. 6. Patient/POA to call should there be a question/concern in there interim.

## 2018-12-12 ENCOUNTER — Other Ambulatory Visit: Payer: Self-pay

## 2018-12-14 ENCOUNTER — Ambulatory Visit (INDEPENDENT_AMBULATORY_CARE_PROVIDER_SITE_OTHER): Payer: Medicare Other | Admitting: Endocrinology

## 2018-12-14 ENCOUNTER — Other Ambulatory Visit: Payer: Self-pay

## 2018-12-14 ENCOUNTER — Encounter: Payer: Self-pay | Admitting: Endocrinology

## 2018-12-14 VITALS — BP 126/50 | HR 95 | Ht 67.0 in | Wt 215.0 lb

## 2018-12-14 DIAGNOSIS — Z794 Long term (current) use of insulin: Secondary | ICD-10-CM

## 2018-12-14 DIAGNOSIS — E1122 Type 2 diabetes mellitus with diabetic chronic kidney disease: Secondary | ICD-10-CM

## 2018-12-14 DIAGNOSIS — Z23 Encounter for immunization: Secondary | ICD-10-CM

## 2018-12-14 DIAGNOSIS — N183 Chronic kidney disease, stage 3 unspecified: Secondary | ICD-10-CM

## 2018-12-14 LAB — POCT GLYCOSYLATED HEMOGLOBIN (HGB A1C): Hemoglobin A1C: 9.4 % — AB (ref 4.0–5.6)

## 2018-12-14 NOTE — Patient Instructions (Addendum)
Please continue the same insulin.  It is important to take this with breakfast. On this type of insulin schedule, you should eat meals on a regular schedule.  If a meal is missed or significantly delayed, your blood sugar could go low.   check your blood sugar twice a day.  vary the time of day when you check, between before the 3 meals, and at bedtime.  also check if you have symptoms of your blood sugar being too high or too low.  please keep a record of the readings and bring it to your next appointment here (or you can bring the meter itself).  You can write it on any piece of paper.  please call us sooner if your blood sugar goes below 70, or if you have a lot of readings over 200. Please come back for a follow-up appointment in 2 months.

## 2018-12-14 NOTE — Progress Notes (Signed)
Subjective:    Patient ID: Marcus Weaver, male    DOB: March 02, 1931, 85 y.o.   MRN: [id number]  HPI Pt returns for f/u of diabetes mellitus: DM type: Insulin-requiring type 2 Dx'ed: 2009.  Complications: renal insufficiency, DR, and polyneuropathy.  Therapy: insulin since 2014 (and metformin).   DKA: never.   Severe hypoglycemia: twice (2011 and 2017).   Pancreatitis: never.   Other: he was changed to QD insulin, after poor results with multiple daily injections.  dtr draws up insulin ahead of time; lantus was changed to 75/25, due to renal failure.   Interval history: pt states cbg's vary from 50-200's.  There is no trend throughout the day.  pt states he feels well in general.  Pt says he never misses the insulin, but he takes at various times of day.   Past Medical History:  Diagnosis Date  . Abnormal EKG 08/14/2015  . CKD (chronic kidney disease), stage II   . Diabetes mellitus    type2  . Hyperlipidemia   . Hypertension   . Hypertensive heart disease 08/14/2015  . Tachycardia    a. HR 132 at endo OV 06/2015, no EKG at that time.  Marland Kitchen UTI (lower urinary tract infection)     Past Surgical History:  Procedure Laterality Date  . CATARACT EXTRACTION      Social History   Socioeconomic History  . Marital status: Single    Spouse name: Not on file  . Number of children: Not on file  . Years of education: Not on file  . Highest education level: Not on file  Occupational History  . Occupation: retired  Scientific laboratory technician  . Financial resource strain: Not on file  . Food insecurity    Worry: Not on file    Inability: Not on file  . Transportation needs    Medical: Not on file    Non-medical: Not on file  Tobacco Use  . Smoking status: Former Smoker    Types: Cigarettes    Quit date: 06/25/2015    Years since quitting: 3.4  . Smokeless tobacco: Never Used  . Tobacco comment: smokes 3 or 4 cigarettes /day  Substance and Sexual Activity  . Alcohol use: No  . Drug use: No  .  Sexual activity: Not on file  Lifestyle  . Physical activity    Days per week: Not on file    Minutes per session: Not on file  . Stress: Not on file  Relationships  . Social Herbalist on phone: Not on file    Gets together: Not on file    Attends religious service: Not on file    Active member of club or organization: Not on file    Attends meetings of clubs or organizations: Not on file    Relationship status: Not on file  . Intimate partner violence    Fear of current or ex partner: Not on file    Emotionally abused: Not on file    Physically abused: Not on file    Forced sexual activity: Not on file  Other Topics Concern  . Not on file  Social History Narrative  . Not on file    Current Outpatient Medications on File Prior to Visit  Medication Sig Dispense Refill  . amLODipine (NORVASC) 5 MG tablet Take 1 tablet (5 mg total) by mouth daily. Please make an overdue appt with Dr. Radford Pax before anymore refills. 3rd and Final 15 tablet 0  . calcitRIOL (  ROCALTROL) 0.25 MCG capsule Take 1 capsule by mouth daily.    . carvedilol (COREG) 6.25 MG tablet Take 1 tablet (6.25 mg total) by mouth 2 (two) times daily. (Patient taking differently: Take 12.5 mg by mouth 2 (two) times daily. ) 60 tablet 11  . furosemide (LASIX) 80 MG tablet Take 2 tablets in the morning and 1 tablet at noon  10  . glucose blood (ONETOUCH VERIO) test strip 1 each by Other route 2 (two) times daily. And lancets 2/day 200 each 3  . Insulin Lispro Prot & Lispro (HUMALOG MIX 75/25 KWIKPEN) (75-25) 100 UNIT/ML Kwikpen Inject 60 Units into the skin daily with breakfast. 10 pen 11  . lisinopril (PRINIVIL,ZESTRIL) 40 MG tablet 1/2 tab daily  5  . lisinopril (ZESTRIL) 20 MG tablet TK 1 T PO D    . ONETOUCH VERIO test strip USE AS DIRECTED TWICE DAILY 100 each 2  . QUEtiapine (SEROQUEL) 25 MG tablet TAKE 1 TABLET(25 MG) BY MOUTH AT BEDTIME 30 tablet 6   No current facility-administered medications on file  prior to visit.     No Known Allergies  Family History  Problem Relation Age of Onset  . Diabetes Other        1st degree relative  . Hypertension Other   . Kidney disease Mother   . Cancer Neg Hx     BP (!) 126/50 (BP Location: Left Arm, Patient Position: Sitting, Cuff Size: Large)   Pulse 95   Ht 5\' 7"  (1.702 m)   Wt 215 lb (97.5 kg)   SpO2 95%   BMI 33.67 kg/m    Review of Systems Denies LOC.      Objective:   Physical Exam VITAL SIGNS:  See vs page GENERAL: no distress Pulses: foot pulses are intact bilaterally.   MSK: no deformity of the feet or ankles.  CV: 1+ bilat edema of the legs.  Skin:  no ulcer on the feet or ankles, but the skin is dry and scaly.  normal color and temp on the feet and ankles.  Neuro: sensation is intact to touch on the feet and ankles.   Ext: There is severe bilateral onychomycosis of the toenails.    Lab Results  Component Value Date   CREATININE 3.52 (H) 07/31/2018   BUN 49 (H) 07/31/2018   NA 136 07/31/2018   K 4.4 07/31/2018   CL 99 07/31/2018   CO2 27 07/31/2018    Lab Results  Component Value Date   HGBA1C 9.4 (A) 12/14/2018       Assessment & Plan:  Insulin-requiring type 2 DM, with DR: worse Hypoglycemia: this limits aggressiveness of glycemic control Renal failure: in this setting, he does not ned a PM insulin dose.  Patient Instructions  Please continue the same insulin.  It is important to take this with breakfast. On this type of insulin schedule, you should eat meals on a regular schedule.  If a meal is missed or significantly delayed, your blood sugar could go low.   check your blood sugar twice a day.  vary the time of day when you check, between before the 3 meals, and at bedtime.  also check if you have symptoms of your blood sugar being too high or too low.  please keep a record of the readings and bring it to your next appointment here (or you can bring the meter itself).  You can write it on any piece of  paper.  please call us sooner  if your blood sugar goes below 70, or if you have a lot of readings over 200. Please come back for a follow-up appointment in 2 months.

## 2019-01-05 ENCOUNTER — Other Ambulatory Visit: Payer: Self-pay

## 2019-01-05 DIAGNOSIS — E119 Type 2 diabetes mellitus without complications: Secondary | ICD-10-CM

## 2019-01-05 MED ORDER — ONETOUCH VERIO VI STRP: 1.0000 | ORAL_STRIP | Freq: Two times a day (BID) | 2 refills | Status: AC

## 2019-01-12 ENCOUNTER — Encounter: Payer: Self-pay | Admitting: Podiatry

## 2019-01-12 ENCOUNTER — Other Ambulatory Visit: Payer: Self-pay

## 2019-01-12 ENCOUNTER — Ambulatory Visit: Payer: Medicare Other | Admitting: Podiatry

## 2019-01-12 DIAGNOSIS — M1000 Idiopathic gout, unspecified site: Secondary | ICD-10-CM

## 2019-01-12 DIAGNOSIS — M779 Enthesopathy, unspecified: Secondary | ICD-10-CM

## 2019-01-12 NOTE — Progress Notes (Signed)
Subjective:   Patient ID: Marcus Weaver, male   DOB: 85 y.o.   MRN: [id number]   HPI Patient presents with caregiver with concerns about swelling in both ankles and also possibility for gout that they have questions about.  States they have been very sore for the last few days I do not remember injury   ROS      Objective:  Physical Exam  Neurovascular status intact negative Bevelyn Buckles' sign noted with patient's ankles bilateral found to be inflamed with discomfort in the ankle region with also discomfort into the extensor tendon complex.  The patient has had an acute issue with this and does not remember any form of injury but does not have good communication skills     Assessment:  Hard to tell whether were dealing with an inflammatory gout situation or dorsal tendinitis     Plan:  Sterile prep and injected the dorsal tendon complex 3 mg Kenalog 5 mg Xylocaine bilateral discussed gout and gave education on gout diet and things to be careful.  Educated caregiver also on gout and what he wanted for and we will see symptoms and if anything else will be necessary

## 2019-01-17 ENCOUNTER — Encounter (HOSPITAL_COMMUNITY): Payer: Self-pay

## 2019-01-17 ENCOUNTER — Other Ambulatory Visit: Payer: Self-pay

## 2019-01-17 ENCOUNTER — Emergency Department (HOSPITAL_COMMUNITY)
Admission: EM | Admit: 2019-01-17 | Discharge: 2019-01-18 | Disposition: A | Discharge status: Home / Self Care | Payer: Medicare Other | Attending: Emergency Medicine | Admitting: Emergency Medicine

## 2019-01-17 DIAGNOSIS — Y9389 Activity, other specified: Secondary | ICD-10-CM | POA: Diagnosis not present

## 2019-01-17 DIAGNOSIS — S90822A Blister (nonthermal), left foot, initial encounter: Secondary | ICD-10-CM | POA: Insufficient documentation

## 2019-01-17 DIAGNOSIS — I1310 Hypertensive heart and chronic kidney disease without heart failure, with stage 1 through stage 4 chronic kidney disease, or unspecified chronic kidney disease: Secondary | ICD-10-CM | POA: Insufficient documentation

## 2019-01-17 DIAGNOSIS — E1122 Type 2 diabetes mellitus with diabetic chronic kidney disease: Secondary | ICD-10-CM | POA: Insufficient documentation

## 2019-01-17 DIAGNOSIS — Y9289 Other specified places as the place of occurrence of the external cause: Secondary | ICD-10-CM | POA: Diagnosis not present

## 2019-01-17 DIAGNOSIS — Z794 Long term (current) use of insulin: Secondary | ICD-10-CM | POA: Diagnosis not present

## 2019-01-17 DIAGNOSIS — Z79899 Other long term (current) drug therapy: Secondary | ICD-10-CM | POA: Diagnosis not present

## 2019-01-17 DIAGNOSIS — N182 Chronic kidney disease, stage 2 (mild): Secondary | ICD-10-CM | POA: Diagnosis not present

## 2019-01-17 DIAGNOSIS — Z87891 Personal history of nicotine dependence: Secondary | ICD-10-CM | POA: Insufficient documentation

## 2019-01-17 DIAGNOSIS — S90821A Blister (nonthermal), right foot, initial encounter: Secondary | ICD-10-CM | POA: Diagnosis present

## 2019-01-17 DIAGNOSIS — X58XXXA Exposure to other specified factors, initial encounter: Secondary | ICD-10-CM | POA: Diagnosis not present

## 2019-01-17 DIAGNOSIS — Z23 Encounter for immunization: Secondary | ICD-10-CM | POA: Diagnosis not present

## 2019-01-17 DIAGNOSIS — Y999 Unspecified external cause status: Secondary | ICD-10-CM | POA: Insufficient documentation

## 2019-01-17 LAB — CBG MONITORING, ED: Glucose-Capillary: 181 mg/dL — ABNORMAL HIGH (ref 70–99)

## 2019-01-17 MED ORDER — ACETAMINOPHEN 500 MG PO TABS
1000.0000 mg | ORAL_TABLET | Freq: Once | ORAL | Status: AC
  Administered 2019-01-17: 1000 mg via ORAL
  Filled 2019-01-17: qty 2

## 2019-01-17 MED ORDER — BACITRACIN ZINC 500 UNIT/GM EX OINT
TOPICAL_OINTMENT | Freq: Once | CUTANEOUS | Status: AC
  Administered 2019-01-18: 01:00:00 via TOPICAL
  Filled 2019-01-17: qty 1.8

## 2019-01-17 NOTE — ED Triage Notes (Signed)
[facility] from home. Painful ulcers on bilateral heels size of golfball one on right hill has ruptured. Family has wrapped both heels. Diagnosis with gout on Friday ulcers became present yesterday. No other complaints at this time.   Hx of hypertension  174/86 99% 18 64

## 2019-01-17 NOTE — ED Provider Notes (Addendum)
TIME SEEN: 11:24 PM  CHIEF COMPLAINT: Bilateral foot ulcers  HPI: Patient is an 85 year old male with history of hypertension, hyperlipidemia, type 2 diabetes, chronic kidney disease who presents to the emergency department with bilateral foot ulcers that appeared yesterday.  States he has been feeling cold and has been wearing thermal socks with his diabetic shoes.  He is not wearing any other shoes.  Has an intact blister to the left lateral foot and ankle and an open blister to the right lateral foot and ankle.  There is no purulent drainage or bleeding.  States the feet feel "sore".  No injury that he can recall.  No fever.  Family states that he has been taking Aleve twice a day for the past week for pain.  Patient states that he is able to ambulate without difficulty.  ROS: See HPI Constitutional: no fever  Eyes: no drainage  ENT: no runny nose   Cardiovascular:  no chest pain  Resp: no SOB  GI: no vomiting GU: no dysuria Integumentary: no rash  Allergy: no hives  Musculoskeletal: no leg swelling  Neurological: no slurred speech ROS otherwise negative  PAST MEDICAL HISTORY/PAST SURGICAL HISTORY:  Past Medical History:  Diagnosis Date  . Abnormal EKG 08/14/2015  . CKD (chronic kidney disease), stage II   . Diabetes mellitus    type2  . Hyperlipidemia   . Hypertension   . Hypertensive heart disease 08/14/2015  . Tachycardia    a. HR 132 at endo OV 06/2015, no EKG at that time.  Marland Kitchen UTI (lower urinary tract infection)     MEDICATIONS:  Prior to Admission medications   Medication Sig Start Date End Date Taking? Authorizing Provider  amLODipine (NORVASC) 5 MG tablet Take 1 tablet (5 mg total) by mouth daily. Please make an overdue appt with Dr. Radford Pax before anymore refills. 3rd and Final 01/18/17   Sueanne Margarita, MD  calcitRIOL (ROCALTROL) 0.25 MCG capsule Take 1 capsule by mouth daily. 05/29/18   [provider]  carvedilol (COREG) 6.25 MG tablet Take 1 tablet (6.25 mg  total) by mouth 2 (two) times daily. Patient taking differently: Take 12.5 mg by mouth 2 (two) times daily.  08/14/15   Sueanne Margarita, MD  furosemide (LASIX) 80 MG tablet Take 2 tablets in the morning and 1 tablet at noon 09/14/16   [provider]  glucose blood (ONETOUCH VERIO) test strip 1 each by Other route 2 (two) times daily. And lancets 2/day 07/01/17   Renato Shin, MD  glucose blood [facility] VERIO) test strip 1 each by Other route 2 (two) times daily. E11.9 01/05/19   Renato Shin, MD  Insulin Lispro Prot & Lispro (HUMALOG MIX 75/25 KWIKPEN) (75-25) 100 UNIT/ML Kwikpen Inject 60 Units into the skin daily with breakfast. 08/16/18   Renato Shin, MD  lisinopril (PRINIVIL,ZESTRIL) 40 MG tablet 1/2 tab daily 09/09/16   [provider]  lisinopril (ZESTRIL) 20 MG tablet TK 1 T PO D 10/26/18   [provider]  QUEtiapine (SEROQUEL) 25 MG tablet TAKE 1 TABLET(25 MG) BY MOUTH AT BEDTIME 08/10/18   [name], MD    ALLERGIES:  No Known Allergies  SOCIAL HISTORY:  Social History   Tobacco Use  . Smoking status: Former Smoker    Types: Cigarettes    Quit date: 06/25/2015    Years since quitting: 3.5  . Smokeless tobacco: Never Used  . Tobacco comment: smokes 3 or 4 cigarettes /day  Substance Use Topics  .  Alcohol use: No    FAMILY HISTORY: Family History  Problem Relation Age of Onset  . Diabetes Other        1st degree relative  . Hypertension Other   . Kidney disease Mother   . Cancer Neg Hx     EXAM: BP (!) 173/62   Pulse 60   Resp 16   Ht 5\' 8"  (1.727 m)   Wt 99.8 kg   SpO2 99%   BMI 33.45 kg/m  CONSTITUTIONAL: Alert and oriented and responds appropriately to questions. Well-appearing; well-nourished, elderly, in no distress HEAD: Normocephalic EYES: Conjunctivae clear, pupils appear equal, EOMI ENT: normal nose; moist mucous membranes NECK: Supple, no meningismus, no nuchal rigidity, no LAD  CARD: RRR; S1 and S2 appreciated; no  murmurs, no clicks, no rubs, no gallops RESP: Normal chest excursion without splinting or tachypnea; breath sounds clear and equal bilaterally; no wheezes, no rhonchi, no rales, no hypoxia or respiratory distress, speaking full sentences ABD/GI: Normal bowel sounds; non-distended; soft, non-tender, no rebound, no guarding, no peritoneal signs, no hepatosplenomegaly BACK:  The back appears normal and is non-tender to palpation, there is no CVA tenderness EXT: Normal ROM in all joints; non-tender to palpation; mild nonpitting edema in bilateral dorsal feet, 2+ DP pulses bilaterally, normal capillary refill; no cyanosis, no calf tenderness or swelling    SKIN: Normal color for age and race; warm; no rash.  Large intact ulcer to the left lateral foot and ankle without drainage, surrounding warmth or redness.  Patient has an open blister with sloughing skin to the right lateral foot and ankle with normal-appearing pink skin underneath.  There is no surrounding redness, warmth or drainage.  Please see pictures below. NEURO: Moves all extremities equally, normal sensation in both lower extremities PSYCH: The patient's mood and manner are appropriate. Grooming and personal hygiene are appropriate.  MEDICAL DECISION MAKING: Patient here with pressure blisters to bilateral feet and ankles.  No ulcers present.  I have debrided the sloughing skin on the right foot and applied bacitracin, Xeroform and wet to dry dressings.  Have shown granddaughter how to do this as well.  I have also sterilely drain the fluid from the left blister but left the skin intact and applied a wet-to-dry dressing.  Have recommended close follow-up with the primary care physician as he would be high risk for infection given his diabetes.  Blood sugar 181 today.  No sign of infection currently to suggest that he needs antibiotics.  Recommended Tylenol for pain.  Will update tetanus vaccination.  Have requested that patient and family avoid  NSAIDs given his chronic kidney disease.  Given he has been taking Aleve for the past week I did check his renal function and it appears actually slightly improved compared to previous.  He has no systemic symptoms today.  Will provide with postoperative shoes to protect the soles of his feet given he will not be able to wear his diabetic shoes given these large blisters and large dressings.  I have placed a face-to-face consult to establish wound care for home.  Discussed return precautions with patient and granddaughter.  They are comfortable with this plan.  At this time, I do not feel there is any life-threatening condition present. I have reviewed and discussed all results (EKG, imaging, lab, urine as appropriate) and exam findings with patient/family. I have reviewed nursing notes and appropriate previous records.  I feel the patient is safe to be discharged home without further emergent workup  and can continue workup as an outpatient as needed. Discussed usual and customary return precautions. Patient/family verbalize understanding and are comfortable with this plan.  Outpatient follow-up has been provided as needed. All questions have been answered.   LEFT FOOT:     RIGHT FOOT:     Marcus Weaver was evaluated in Emergency Department on 01/17/2019 for the symptoms described in the history of present illness. He was evaluated in the context of the global COVID-19 pandemic, which necessitated consideration that the patient might be at risk for infection with the SARS-CoV-2 virus that causes COVID-19. Institutional protocols and algorithms that pertain to the evaluation of patients at risk for COVID-19 are in a state of rapid change based on information released by regulatory bodies including the CDC and federal and state organizations. These policies and algorithms were followed during the patient's care in the ED.    , Delice Bison, DO 01/18/19 [place], Delice Bison, DO 01/18/19 [phone]

## 2019-01-18 ENCOUNTER — Ambulatory Visit: Payer: Medicare Other | Admitting: Family Medicine

## 2019-01-18 LAB — I-STAT CHEM 8, ED
BUN: 50 mg/dL — ABNORMAL HIGH (ref 8–23)
Calcium, Ion: 1.14 mmol/L — ABNORMAL LOW (ref 1.15–1.40)
Chloride: 106 mmol/L (ref 98–111)
Creatinine, Ser: 2.8 mg/dL — ABNORMAL HIGH (ref 0.61–1.24)
Glucose, Bld: 170 mg/dL — ABNORMAL HIGH (ref 70–99)
HCT: 32 % — ABNORMAL LOW (ref 39.0–52.0)
Hemoglobin: 10.9 g/dL — ABNORMAL LOW (ref 13.0–17.0)
Potassium: 3.4 mmol/L — ABNORMAL LOW (ref 3.5–5.1)
Sodium: 144 mmol/L (ref 135–145)
TCO2: 23 mmol/L (ref 22–32)

## 2019-01-18 MED ORDER — BACITRACIN ZINC 500 UNIT/GM EX OINT
TOPICAL_OINTMENT | CUTANEOUS | Status: AC
  Filled 2019-01-18: qty 1.8

## 2019-01-18 MED ORDER — TETANUS-DIPHTH-ACELL PERTUSSIS 5-2.5-18.5 LF-MCG/0.5 IM SUSP
0.5000 mL | Freq: Once | INTRAMUSCULAR | Status: AC
  Administered 2019-01-18: 0.5 mL via INTRAMUSCULAR
  Filled 2019-01-18: qty 0.5

## 2019-01-18 MED ORDER — BACITRACIN ZINC 500 UNIT/GM EX OINT
TOPICAL_OINTMENT | CUTANEOUS | Status: AC
  Filled 2019-01-18: qty 3.6

## 2019-01-18 NOTE — Discharge Instructions (Addendum)
I have placed a referral for home health nursing to help with wound care but I recommend close follow-up with your primary care physician as well.  You do not need antibiotics at this time.  There is no sign of infection.  I have also discharge you with postoperative shoes that you can wear at home that will protect the soles of your feet to prevent further injury since you will not be able to wear your diabetic shoes for several days due to swelling and dressings in place.   I recommend applying over-the-counter Neosporin/triple antibiotic ointment or bacitracin once a day and then a nonadherent dressing such as Xeroform, wet gauze, dry gauze and then wrapped.  Do not apply tape to patient's skin.  You may take Tylenol 1000 mg every 6 hours as needed for pain.  Please avoid ibuprofen, Goody powders, Aleve, Motrin, Advil given your history of chronic kidney disease.  Your labs showed that your kidney function was stable today.

## 2019-01-19 ENCOUNTER — Ambulatory Visit (INDEPENDENT_AMBULATORY_CARE_PROVIDER_SITE_OTHER): Payer: Medicare Other | Admitting: Family Medicine

## 2019-01-19 ENCOUNTER — Encounter: Payer: Self-pay | Admitting: Family Medicine

## 2019-01-19 ENCOUNTER — Other Ambulatory Visit: Payer: Self-pay

## 2019-01-19 DIAGNOSIS — S90822D Blister (nonthermal), left foot, subsequent encounter: Secondary | ICD-10-CM

## 2019-01-19 DIAGNOSIS — L089 Local infection of the skin and subcutaneous tissue, unspecified: Secondary | ICD-10-CM | POA: Diagnosis not present

## 2019-01-19 MED ORDER — AMOXICILLIN-POT CLAVULANATE 875-125 MG PO TABS: 1.0000 | ORAL_TABLET | Freq: Two times a day (BID) | ORAL | 0 refills | Status: DC

## 2019-01-19 NOTE — Progress Notes (Signed)
I have discussed the procedure for the virtual visit with the patient who has given consent to proceed with assessment and treatment.   Pt daughter unable to obtain vitals.   Abdulrahim Siddiqi L Geremiah Fussell, CMA     

## 2019-01-19 NOTE — Progress Notes (Signed)
Virtual Visit via Video   I connected with patient on 01/19/19 at 10:30 AM EDT by a video enabled telemedicine application and verified that I am speaking with the correct person using two identifiers.  Location patient: Home Location provider: [facility], Office Persons participating in the virtual visit: Patient, Provider, [place] (Jess B)  I discussed the limitations of evaluation and management by telemedicine and the availability of in person appointments. The patient expressed understanding and agreed to proceed.  Subjective:   HPI:   ER follow up- pt went to ER on 10/28 for bilateral blisters on feet.  Large intact blister on L lateral foot/ankle and open blister on R foot/ankle.  No drainage or bleeding.  ER debrided sloughing skin on R foot and area was dressed w/ bacitracin, xeroform, and wet to dry dressings.  The fluid was drainged from the L blister and wet to dry dressing applied.  Family was shown how to do dressings.  No evidence of infxn so no abx given.  Post op shoes were given to provide support and protection.  R blister is open w/ healthy tissue exposed, no redness or drainage.  L blister has skin on top but developing redness at the 'bottom' of the wound w/ some brown drainage.  Pt denies pain but seems uncomfortable when feet are touched.  Pt had steroid injxns in feet 1 week ago w/ podiatry.  ROS:   See pertinent positives and negatives per HPI.  Patient Active Problem List   Diagnosis Date Noted  . Essential hypertension 10/13/2015  . Hypertensive heart disease 08/14/2015  . Abnormal EKG 08/14/2015  . Diabetes ([place]) 07/12/2015  . Tachycardia 07/11/2015  . CKD (chronic kidney disease) stage 4, GFR 15-29 ml/min (HCC) 05/18/2015  . Dementia ([place]) 05/15/2015  . Numbness in both hands 05/05/2015  . Polyuria 01/02/2015  . Broken teeth 06/01/2013  . Physical exam, annual 11/05/2011  . Hyperlipidemia associated with type 2 diabetes mellitus ([place])  02/10/2010  . TOBACCO ABUSE 01/24/2007  . GLAUCOMA 01/24/2007  . CATARACTS 01/24/2007  . CONSTIPATION 01/24/2007    Social History   Tobacco Use  . Smoking status: Former Smoker    Types: Cigarettes    Quit date: 06/25/2015    Years since quitting: 3.5  . Smokeless tobacco: Never Used  . Tobacco comment: smokes 3 or 4 cigarettes /day  Substance Use Topics  . Alcohol use: No    Current Outpatient Medications:  .  amLODipine (NORVASC) 5 MG tablet, Take 1 tablet (5 mg total) by mouth daily. Please make an overdue appt with Dr. Radford Pax before anymore refills. 3rd and Final, Disp: 15 tablet, Rfl: 0 .  calcitRIOL (ROCALTROL) 0.25 MCG capsule, Take 1 capsule by mouth daily., Disp: , Rfl:  .  carvedilol (COREG) 6.25 MG tablet, Take 1 tablet (6.25 mg total) by mouth 2 (two) times daily. (Patient taking differently: Take 12.5 mg by mouth 2 (two) times daily. ), Disp: 60 tablet, Rfl: 11 .  furosemide (LASIX) 80 MG tablet, Take 2 tablets in the morning and 1 tablet at noon, Disp: , Rfl: 10 .  glucose blood (ONETOUCH VERIO) test strip, 1 each by Other route 2 (two) times daily. And lancets 2/day, Disp: 200 each, Rfl: 3 .  glucose blood (ONETOUCH VERIO) test strip, 1 each by Other route 2 (two) times daily. E11.9, Disp: 100 each, Rfl: 2 .  Insulin Lispro Prot & Lispro (HUMALOG MIX 75/25 KWIKPEN) (75-25) 100 UNIT/ML Kwikpen, Inject 60 Units into the  skin daily with breakfast., Disp: 10 pen, Rfl: 11 .  lisinopril (ZESTRIL) 20 MG tablet, Take 20 mg by mouth daily. , Disp: , Rfl:  .  QUEtiapine (SEROQUEL) 25 MG tablet, TAKE 1 TABLET(25 MG) BY MOUTH AT BEDTIME (Patient taking differently: Take 25 mg by mouth at bedtime. ), Disp: 30 tablet, Rfl: 6  No Known Allergies  Objective:   There were no vitals taken for this visit.  Patient is well-developed, well-nourished in no acute distress.  Resting comfortably in bed at home.  R lateral foot w/ large area of raw skin, no surrounding redness or drainage  L lateral foot w/ overlying skin intact but some mild redness along inferior border   Assessment and Plan:   Infected blister of L lateral foot- new.  area is difficult to see on camera today but daughter is reporting redness and brown drainage.  Pt appears in pain when area is touched.  Given his diabetes and the fact he is at high risk of non-healing, will start Augmentin.  Encouraged family to reach out to podiatry as they will likely want to follow up sooner than November 13th.  Again reviewed wound care w/ daughter.  Will follow closely.  Annye Asa, MD 01/19/2019

## 2019-01-31 ENCOUNTER — Ambulatory Visit: Payer: Medicare Other | Admitting: Podiatry

## 2019-01-31 ENCOUNTER — Other Ambulatory Visit: Payer: Self-pay

## 2019-01-31 ENCOUNTER — Encounter: Payer: Self-pay | Admitting: Podiatry

## 2019-01-31 DIAGNOSIS — L89621 Pressure ulcer of left heel, stage 1: Secondary | ICD-10-CM | POA: Diagnosis not present

## 2019-01-31 DIAGNOSIS — L89611 Pressure ulcer of right heel, stage 1: Secondary | ICD-10-CM | POA: Diagnosis not present

## 2019-01-31 MED ORDER — GENTAMICIN SULFATE 0.1 % EX CREA: 1.0000 "application " | TOPICAL_CREAM | Freq: Two times a day (BID) | CUTANEOUS | 1 refills | Status: DC

## 2019-02-02 ENCOUNTER — Ambulatory Visit: Payer: Medicare Other | Admitting: Podiatry

## 2019-02-02 ENCOUNTER — Ambulatory Visit (INDEPENDENT_AMBULATORY_CARE_PROVIDER_SITE_OTHER): Payer: Medicare Other | Admitting: Endocrinology

## 2019-02-02 ENCOUNTER — Encounter: Payer: Self-pay | Admitting: Endocrinology

## 2019-02-02 VITALS — BP 128/70 | HR 90 | Ht 68.0 in | Wt 211.2 lb

## 2019-02-02 DIAGNOSIS — E1122 Type 2 diabetes mellitus with diabetic chronic kidney disease: Secondary | ICD-10-CM | POA: Diagnosis not present

## 2019-02-02 DIAGNOSIS — N189 Chronic kidney disease, unspecified: Secondary | ICD-10-CM | POA: Diagnosis not present

## 2019-02-02 DIAGNOSIS — Z9114 Patient's other noncompliance with medication regimen: Secondary | ICD-10-CM

## 2019-02-02 DIAGNOSIS — E11319 Type 2 diabetes mellitus with unspecified diabetic retinopathy without macular edema: Secondary | ICD-10-CM | POA: Diagnosis not present

## 2019-02-02 DIAGNOSIS — E119 Type 2 diabetes mellitus without complications: Secondary | ICD-10-CM

## 2019-02-02 DIAGNOSIS — Z794 Long term (current) use of insulin: Secondary | ICD-10-CM

## 2019-02-02 LAB — POCT GLYCOSYLATED HEMOGLOBIN (HGB A1C): Hemoglobin A1C: 9.2 % — AB (ref 4.0–5.6)

## 2019-02-02 MED ORDER — HUMULIN N KWIKPEN 100 UNIT/ML ~~LOC~~ SUPN: 60.0000 [IU] | PEN_INJECTOR | SUBCUTANEOUS | 11 refills | Status: AC

## 2019-02-02 NOTE — Progress Notes (Signed)
Subjective:    Patient ID: Marcus Weaver, male    DOB: 02-03-1931, 85 y.o.   MRN: [id number]  HPI Pt returns for f/u of diabetes mellitus: DM type: Insulin-requiring type 2 Dx'ed: 2009.  Complications: renal insufficiency, DR, and polyneuropathy.  Therapy: insulin since 2014 (and metformin).   DKA: never.   Severe hypoglycemia: twice (2011 and 2017).   Pancreatitis: never.   Other: he was changed to QD insulin, after poor results with multiple daily injections.  dtr draws up insulin ahead of time; lantus was changed to 75/25, due to renal failure; dtr works during the day, but prepares lunch for pt.     Interval history: pt is again here with dtr today; no cbg record, but pt states cbg's vary from 36-384.  There is no trend throughout the day.  pt states he feels well in general.  Pt says he never misses the insulin, but he takes 2 hrs after breakfast.  He often misses lunch.   Past Medical History:  Diagnosis Date  . Abnormal EKG 08/14/2015  . CKD (chronic kidney disease), stage II   . Diabetes mellitus    type2  . Hyperlipidemia   . Hypertension   . Hypertensive heart disease 08/14/2015  . Tachycardia    a. HR 132 at endo OV 06/2015, no EKG at that time.  Marland Kitchen UTI (lower urinary tract infection)     Past Surgical History:  Procedure Laterality Date  . CATARACT EXTRACTION      Social History   Socioeconomic History  . Marital status: Single    Spouse name: Not on file  . Number of children: Not on file  . Years of education: Not on file  . Highest education level: Not on file  Occupational History  . Occupation: retired  Scientific laboratory technician  . Financial resource strain: Not on file  . Food insecurity    Worry: Not on file    Inability: Not on file  . Transportation needs    Medical: Not on file    Non-medical: Not on file  Tobacco Use  . Smoking status: Former Smoker    Types: Cigarettes    Quit date: 06/25/2015    Years since quitting: 3.6  . Smokeless tobacco: Never Used   . Tobacco comment: smokes 3 or 4 cigarettes /day  Substance and Sexual Activity  . Alcohol use: No  . Drug use: No  . Sexual activity: Not on file  Lifestyle  . Physical activity    Days per week: Not on file    Minutes per session: Not on file  . Stress: Not on file  Relationships  . Social Herbalist on phone: Not on file    Gets together: Not on file    Attends religious service: Not on file    Active member of club or organization: Not on file    Attends meetings of clubs or organizations: Not on file    Relationship status: Not on file  . Intimate partner violence    Fear of current or ex partner: Not on file    Emotionally abused: Not on file    Physically abused: Not on file    Forced sexual activity: Not on file  Other Topics Concern  . Not on file  Social History Narrative  . Not on file    Current Outpatient Medications on File Prior to Visit  Medication Sig Dispense Refill  . amLODipine (NORVASC) 5 MG tablet Take 1  tablet (5 mg total) by mouth daily. Please make an overdue appt with Dr. Radford Pax before anymore refills. 3rd and Final 15 tablet 0  . calcitRIOL (ROCALTROL) 0.25 MCG capsule Take 1 capsule by mouth daily.    . carvedilol (COREG) 6.25 MG tablet Take 1 tablet (6.25 mg total) by mouth 2 (two) times daily. (Patient taking differently: Take 12.5 mg by mouth 2 (two) times daily. ) 60 tablet 11  . furosemide (LASIX) 80 MG tablet Take 2 tablets in the morning and 1 tablet at noon  10  . gentamicin cream (GARAMYCIN) 0.1 % Apply 1 application topically 2 (two) times daily. 30 g 1  . glucose blood (ONETOUCH VERIO) test strip 1 each by Other route 2 (two) times daily. E11.9 100 each 2  . lisinopril (ZESTRIL) 20 MG tablet Take 20 mg by mouth daily.     . QUEtiapine (SEROQUEL) 25 MG tablet TAKE 1 TABLET(25 MG) BY MOUTH AT BEDTIME (Patient taking differently: Take 25 mg by mouth at bedtime. ) 30 tablet 6   No current facility-administered medications on file  prior to visit.     No Known Allergies  Family History  Problem Relation Age of Onset  . Diabetes Other        1st degree relative  . Hypertension Other   . Kidney disease Mother   . Cancer Neg Hx     BP 128/70 (BP Location: Left Arm, Patient Position: Sitting, Cuff Size: Large)   Pulse 90   Ht 5\' 8"  (1.727 m)   Wt 211 lb 3.2 oz (95.8 kg)   SpO2 99%   BMI 32.11 kg/m    Review of Systems Denies LOC.      Objective:   Physical Exam VITAL SIGNS:  See vs page GENERAL: no distress Pulses: dorsalis pedis intact bilat.   MSK: no deformity of the feet CV: trace bilat leg edema Skin: large healing ulcers of both lat malleolae (sees podiatry).  normal color and temp on the feet.  Neuro: sensation is intact to touch on the feet.   Ext: there is bilateral onychomycosis of the toenails.    Lab Results  Component Value Date   HGBA1C 9.2 (A) 02/02/2019   Lab Results  Component Value Date   CREATININE 2.80 (H) 01/18/2019   BUN 50 (H) 01/18/2019   NA 144 01/18/2019   K 3.4 (L) 01/18/2019   CL 106 01/18/2019   CO2 27 07/31/2018   Lab Results  Component Value Date   TSH 4.39 07/31/2018       Assessment & Plan:  Insulin-requiring type 2 DM, with DR: he needs increased rx noncompliance with timing of insulin dosing.  We'll change to NPH, so he does not have to take with a meal.   Renal failure: in this setting, he should take NPH rather than Lantus.    Patient Instructions  Please change the insulin to "NPH," at the same amount.   On this type of insulin schedule, you should eat meals on a regular schedule.  If a meal is missed or significantly delayed, your blood sugar could go low.   check your blood sugar twice a day.  vary the time of day when you check, between before the 3 meals, and at bedtime.  also check if you have symptoms of your blood sugar being too high or too low.  please keep a record of the readings and bring it to your next appointment here (or you can  bring the  meter itself).  You can write it on any piece of paper.  please call us sooner if your blood sugar goes below 70, or if you have a lot of readings over 200. Please come back for a follow-up appointment in 2 months.

## 2019-02-02 NOTE — Patient Instructions (Addendum)
Please change the insulin to "NPH," at the same amount.   On this type of insulin schedule, you should eat meals on a regular schedule.  If a meal is missed or significantly delayed, your blood sugar could go low.   check your blood sugar twice a day.  vary the time of day when you check, between before the 3 meals, and at bedtime.  also check if you have symptoms of your blood sugar being too high or too low.  please keep a record of the readings and bring it to your next appointment here (or you can bring the meter itself).  You can write it on any piece of paper.  please call us sooner if your blood sugar goes below 70, or if you have a lot of readings over 200. Please come back for a follow-up appointment in 2 months.

## 2019-02-05 NOTE — Progress Notes (Signed)
   HPI: 85 y.o. male with PMHx of T2DM presenting today with a chief complaint of wounds noted to the bilateral heels that appeared about three weeks ago. He has been wrapping the wounds daily and keeping them clean since he noticed them. Walking and bearing weight causes discomfort. There are no other modifying factors noted. Patient is here for further evaluation and treatment.   Past Medical History:  Diagnosis Date  . Abnormal EKG 08/14/2015  . CKD (chronic kidney disease), stage II   . Diabetes mellitus    type2  . Hyperlipidemia   . Hypertension   . Hypertensive heart disease 08/14/2015  . Tachycardia    a. HR 132 at endo OV 06/2015, no EKG at that time.  Marland Kitchen UTI (lower urinary tract infection)      Physical Exam: General: The patient is alert and oriented x3 in no acute distress.  Dermatology: Wound #1 noted to the right heel measuring approximately 3.0 x 3.0 x 0.1 cm.   Wound #2 noted to the left heel measuring approximately 3.0 x 3.0 x 0.1 cm.   To the above-noted ulceration, there is no eschar. There is a moderate amount of slough, fibrin and necrotic tissue. Granulation tissue and wound base is red. There is no malodor. There is a minimal amount of serosanginous drainage noted. Periwound integrity is intact.  Skin is warm, dry and supple bilateral lower extremities.   Vascular: Palpable pedal pulses bilaterally. No edema or erythema noted. Capillary refill within normal limits.  Neurological: Epicritic and protective threshold diminished bilaterally.   Musculoskeletal Exam: Range of motion within normal limits to all pedal and ankle joints bilateral. Muscle strength 5/5 in all groups bilateral.   Assessment: 1. Bilateral heel ulcerations    Plan of Care:  1. Patient evaluated. 2. Dry sterile dressing applied.  3. Prescription for Gentamicin cream provided to patient.  4. Offloading heel bunny boots provided.  5. Return to clinic in 3 weeks with Dr. Elisha Ponder for follow  up.      Edrick Kins, DPM [facility]  Dr. Edrick Kins, DPM    [address], [place] [address]                Office [phone]  Fax [phone]

## 2019-02-28 ENCOUNTER — Encounter: Payer: Self-pay | Admitting: Podiatry

## 2019-02-28 ENCOUNTER — Other Ambulatory Visit: Payer: Self-pay

## 2019-02-28 ENCOUNTER — Ambulatory Visit (INDEPENDENT_AMBULATORY_CARE_PROVIDER_SITE_OTHER): Payer: Medicare Other | Admitting: Podiatry

## 2019-02-28 DIAGNOSIS — L89621 Pressure ulcer of left heel, stage 1: Secondary | ICD-10-CM

## 2019-02-28 DIAGNOSIS — M79674 Pain in right toe(s): Secondary | ICD-10-CM

## 2019-02-28 DIAGNOSIS — B351 Tinea unguium: Secondary | ICD-10-CM

## 2019-02-28 DIAGNOSIS — L89611 Pressure ulcer of right heel, stage 1: Secondary | ICD-10-CM | POA: Diagnosis not present

## 2019-02-28 DIAGNOSIS — E1142 Type 2 diabetes mellitus with diabetic polyneuropathy: Secondary | ICD-10-CM

## 2019-02-28 MED ORDER — GENTAMICIN SULFATE 0.1 % EX CREA: 1.0000 "application " | TOPICAL_CREAM | Freq: Two times a day (BID) | CUTANEOUS | 2 refills | Status: AC

## 2019-02-28 NOTE — Patient Instructions (Signed)
Apply Aquaphor Ointment to both feet once daily. Avoid application between toes.    Diabetes Mellitus and Foot Care Foot care is an important part of your health, especially when you have diabetes. Diabetes may cause you to have problems because of poor blood flow (circulation) to your feet and legs, which can cause your skin to:  Become thinner and drier.  Break more easily.  Heal more slowly.  Peel and crack. You may also have nerve damage (neuropathy) in your legs and feet, causing decreased feeling in them. This means that you may not notice minor injuries to your feet that could lead to more serious problems. Noticing and addressing any potential problems early is the best way to prevent future foot problems. How to care for your feet Foot hygiene  Wash your feet daily with warm water and mild soap. Do not use hot water. Then, pat your feet and the areas between your toes until they are completely dry. Do not soak your feet as this can dry your skin.  Trim your toenails straight across. Do not dig under them or around the cuticle. File the edges of your nails with an emery board or nail file.  Apply a moisturizing lotion or petroleum jelly to the skin on your feet and to dry, brittle toenails. Use lotion that does not contain alcohol and is unscented. Do not apply lotion between your toes. Shoes and socks  Wear clean socks or stockings every day. Make sure they are not too tight. Do not wear knee-high stockings since they may decrease blood flow to your legs.  Wear shoes that fit properly and have enough cushioning. Always look in your shoes before you put them on to be sure there are no objects inside.  To break in new shoes, wear them for just a few hours a day. This prevents injuries on your feet. Wounds, scrapes, corns, and calluses  Check your feet daily for blisters, cuts, bruises, sores, and redness. If you cannot see the bottom of your feet, use a mirror or ask someone for  help.  Do not cut corns or calluses or try to remove them with medicine.  If you find a minor scrape, cut, or break in the skin on your feet, keep it and the skin around it clean and dry. You may clean these areas with mild soap and water. Do not clean the area with peroxide, alcohol, or iodine.  If you have a wound, scrape, corn, or callus on your foot, look at it several times a day to make sure it is healing and not infected. Check for: ? Redness, swelling, or pain. ? Fluid or blood. ? Warmth. ? Pus or a bad smell. General instructions  Do not cross your legs. This may decrease blood flow to your feet.  Do not use heating pads or hot water bottles on your feet. They may burn your skin. If you have lost feeling in your feet or legs, you may not know this is happening until it is too late.  Protect your feet from hot and cold by wearing shoes, such as at the beach or on hot pavement.  Schedule a complete foot exam at least once a year (annually) or more often if you have foot problems. If you have foot problems, report any cuts, sores, or bruises to your health care provider immediately. Contact a health care provider if:  You have a medical condition that increases your risk of infection and you have any  cuts, sores, or bruises on your feet.  You have an injury that is not healing.  You have redness on your legs or feet.  You feel burning or tingling in your legs or feet.  You have pain or cramps in your legs and feet.  Your legs or feet are numb.  Your feet always feel cold.  You have pain around a toenail. Get help right away if:  You have a wound, scrape, corn, or callus on your foot and: ? You have pain, swelling, or redness that gets worse. ? You have fluid or blood coming from the wound, scrape, corn, or callus. ? Your wound, scrape, corn, or callus feels warm to the touch. ? You have pus or a bad smell coming from the wound, scrape, corn, or callus. ? You have a  fever. ? You have a red line going up your leg. Summary  Check your feet every day for cuts, sores, red spots, swelling, and blisters.  Moisturize feet and legs daily.  Wear shoes that fit properly and have enough cushioning.  If you have foot problems, report any cuts, sores, or bruises to your health care provider immediately.  Schedule a complete foot exam at least once a year (annually) or more often if you have foot problems. This information is not intended to replace advice given to you by your health care provider. Make sure you discuss any questions you have with your health care provider. Document Released: 03/05/2000 Document Revised: 04/20/2017 Document Reviewed: 04/09/2016 Elsevier Patient Education  2020 [employer].

## 2019-02-28 NOTE — Progress Notes (Signed)
Subjective: Patient presents today with diabetes and cc of painful, discolored, thick toenails which interfere with daily activities. Pain is aggravated when wearing enclosed shoe gear. Pain is getting progressively worse and relieved with periodic professional debridement.  [name], MD is his PCP.  Marcus Weaver presents with his son on today's visit. He has h/o bilateral heel ulcerations. Son notes improvement in appearance, left greater than right. He still has peeling skin on right heel. Denies any worsening, redness, drainage or odor.   Pt states he is out of Gentamycin Cream.   Current Outpatient Medications on File Prior to Visit  Medication Sig Dispense Refill  . amLODipine (NORVASC) 5 MG tablet Take 1 tablet (5 mg total) by mouth daily. Please make an overdue appt with Dr. Radford Pax before anymore refills. 3rd and Final 15 tablet 0  . calcitRIOL (ROCALTROL) 0.25 MCG capsule Take 1 capsule by mouth daily.    . carvedilol (COREG) 6.25 MG tablet Take 1 tablet (6.25 mg total) by mouth 2 (two) times daily. (Patient taking differently: Take 12.5 mg by mouth 2 (two) times daily. ) 60 tablet 11  . furosemide (LASIX) 80 MG tablet Take 2 tablets in the morning and 1 tablet at noon  10  . glucose blood (ONETOUCH VERIO) test strip 1 each by Other route 2 (two) times daily. E11.9 100 each 2  . Insulin NPH, Human,, Isophane, (HUMULIN N KWIKPEN) 100 UNIT/ML Kiwkpen Inject 60 Units into the skin every morning. And pen needles 1/day 10 pen 11  . lisinopril (ZESTRIL) 20 MG tablet Take 20 mg by mouth daily.     . QUEtiapine (SEROQUEL) 25 MG tablet TAKE 1 TABLET(25 MG) BY MOUTH AT BEDTIME (Patient taking differently: Take 25 mg by mouth at bedtime. ) 30 tablet 6  . tamsulosin (FLOMAX) 0.4 MG CAPS capsule Take 0.4 mg by mouth daily.     No current facility-administered medications on file prior to visit.      No Known Allergies   Objective: There were no vitals filed for this visit.  Vascular  Examination: Capillary refill time WNL x 10 digits.  Dorsalis pedis pulses palpable b/l.  Posterior tibial pulses palpable b/l.  Digital hair diminished b/l.  Skin temperature gradient WNL b/l.  Dermatological Examination: Skin warm and dry b/l.  Toenails 1-5 b/l discolored, thick, dystrophic with subungual debris and pain with palpation to nailbeds due to thickness of nails.      Ulceration located b/l heels: Dry, peeling skin noted posterior aspect of right heel. He has an oblique scab noted. No erythema, no edema, no drainage, no flocculence. No pain on palpation.   No periulcerative erythema, no edema, no drainage.  No lymphangitis. No purulence expressed. Flocculence absent.  Malodor absent.  Left heel ulcer resolved.   Musculoskeletal: Muscle strength 5/5 to all LE muscle groups b/l.   Neurological: Sensation diminished with 10 gram monofilament.  Assessment: 1. Painful onychomycosis toenails 1-5 b/l 2. Resolved pressure ulcers b/l heels 3. NIDDM with peripheral neuropathy  Plan: 1. Toenails 1-5 b/l were debrided in length and girth without iatrogenic bleeding. 2. Dry skin plaque removed right heel. Continue Gentamicin Cream to right heel once daily. 3. Written instructions to apply Aquaphor Ointment to both feet daily.  4. Continue heel protectors daily when in bed.  5. Patient instructed to report to emergency department with worsening appearance of ulcer/toe/foot, increased pain, foul odor, increased redness, swelling, drainage, fever, chills, nightsweats, nausea, vomiting, increased blood sugar.  6. Patient/POA related  understanding. 7. Follow up 9 weeks. 8. Patient/POA to call should there be a concern in the interim.

## 2019-03-09 ENCOUNTER — Other Ambulatory Visit: Payer: Self-pay | Admitting: General Practice

## 2019-03-09 MED ORDER — QUETIAPINE FUMARATE 25 MG PO TABS: ORAL_TABLET | ORAL | 6 refills | Status: AC

## 2019-03-19 ENCOUNTER — Emergency Department (HOSPITAL_COMMUNITY)
Admission: EM | Admit: 2019-03-19 | Discharge: 2019-03-19 | Discharge status: Against Medical Advice | Payer: Medicare Other | Source: Home / Self Care

## 2019-03-19 ENCOUNTER — Telehealth: Payer: Self-pay

## 2019-03-19 ENCOUNTER — Encounter (HOSPITAL_COMMUNITY): Payer: Self-pay | Admitting: *Deleted

## 2019-03-19 DIAGNOSIS — U071 COVID-19: Secondary | ICD-10-CM | POA: Diagnosis not present

## 2019-03-19 DIAGNOSIS — Z5321 Procedure and treatment not carried out due to patient leaving prior to being seen by health care provider: Secondary | ICD-10-CM | POA: Insufficient documentation

## 2019-03-19 DIAGNOSIS — N19 Unspecified kidney failure: Secondary | ICD-10-CM | POA: Diagnosis not present

## 2019-03-19 LAB — BASIC METABOLIC PANEL
Anion gap: 13 (ref 5–15)
BUN: 64 mg/dL — ABNORMAL HIGH (ref 8–23)
CO2: 23 mmol/L (ref 22–32)
Calcium: 8.5 mg/dL — ABNORMAL LOW (ref 8.9–10.3)
Chloride: 101 mmol/L (ref 98–111)
Creatinine, Ser: 4.14 mg/dL — ABNORMAL HIGH (ref 0.61–1.24)
GFR calc Af Amer: 14 mL/min — ABNORMAL LOW (ref >60)
GFR calc non Af Amer: 12 mL/min — ABNORMAL LOW (ref >60)
Glucose, Bld: 366 mg/dL — ABNORMAL HIGH (ref 70–99)
Potassium: 4 mmol/L (ref 3.5–5.1)
Sodium: 137 mmol/L (ref 135–145)

## 2019-03-19 LAB — CBC
HCT: 35.6 % — ABNORMAL LOW (ref 39.0–52.0)
Hemoglobin: 11.7 g/dL — ABNORMAL LOW (ref 13.0–17.0)
MCH: 29.1 pg (ref 26.0–34.0)
MCHC: 32.9 g/dL (ref 30.0–36.0)
MCV: 88.6 fL (ref 80.0–100.0)
Platelets: 183 10*3/uL (ref 150–400)
RBC: 4.02 MIL/uL — ABNORMAL LOW (ref 4.22–5.81)
RDW: 12.8 % (ref 11.5–15.5)
WBC: 5.9 10*3/uL (ref 4.0–10.5)
nRBC: 0 % (ref 0.0–0.2)

## 2019-03-19 LAB — CBG MONITORING, ED
Glucose-Capillary: 214 mg/dL — ABNORMAL HIGH (ref 70–99)
Glucose-Capillary: 203 mg/dL — ABNORMAL HIGH (ref 70–99)

## 2019-03-19 NOTE — ED Notes (Signed)
pts family stated that they were not waiting anymore and was asked x3 to stay and wait to be seen by a provider. Pts family refused and left with pt and has left the facility.

## 2019-03-19 NOTE — ED Triage Notes (Signed)
Pt was brought in by ems due to hyperglycemia.  Pt CBG is "normally in 300's" but it was in 400's today.  Pt has dementia but is at his own baseline per ems.  EMS reports that pt has not been given his medication for 2 days (no oral medications).  EMS is not sure why pt was not given any medications PO.  Pt is calm and cooperative in [facility] in triage. EKG for ems showed trigemini.  Pt lives with family and they are on the way up to ED.

## 2019-03-19 NOTE — Telephone Encounter (Signed)
Patient's daughter Florian Buff [phone] called in stating that patient's blood sugar is currently 530. Refusing to get out of bed and urinating on himself. Stated that patient might have an UTI. Informed daughter that patient needs to be taken to ER, 911 transport is an option is patient is unable to get out of the bed. Informed Beulah that I would route PCP a message.

## 2019-03-19 NOTE — Telephone Encounter (Signed)
Must go to ER for evaluation based on extremely elevated sugars and inability to get out of bed

## 2019-03-20 NOTE — Telephone Encounter (Signed)
Patient's daughter called stating that patient was taken to the hospital and waited over 6 hours before he left because he had not been seen.  She states that he was very agitated so her brother picked him up last night.   She states that his sugar came down to the 270's, and he seems a little better this morning.   She states that he is up and moving, but he is just moving slow.  She states that she is going to try to get ahold of him today to see how he is doing - she said that she wanted to update [place].  She has been advised that if he is still confused, has elevated sugar and difficulty moving that he needs to return to the ER for treatment. She stated verbal understanding and said she is going to call to check on him.

## 2019-03-22 ENCOUNTER — Other Ambulatory Visit: Payer: Self-pay

## 2019-03-22 ENCOUNTER — Inpatient Hospital Stay (HOSPITAL_BASED_OUTPATIENT_CLINIC_OR_DEPARTMENT_OTHER): Payer: Medicare Other

## 2019-03-22 ENCOUNTER — Encounter (HOSPITAL_BASED_OUTPATIENT_CLINIC_OR_DEPARTMENT_OTHER): Payer: Self-pay | Admitting: *Deleted

## 2019-03-22 ENCOUNTER — Inpatient Hospital Stay (HOSPITAL_BASED_OUTPATIENT_CLINIC_OR_DEPARTMENT_OTHER)
Admission: EM | Admit: 2019-03-22 | Discharge: 2019-04-01 | DRG: 177 | Disposition: A | Discharge status: Hospice | Payer: Medicare Other | Attending: Internal Medicine | Admitting: Internal Medicine

## 2019-03-22 ENCOUNTER — Emergency Department (HOSPITAL_BASED_OUTPATIENT_CLINIC_OR_DEPARTMENT_OTHER): Payer: Medicare Other

## 2019-03-22 DIAGNOSIS — I10 Essential (primary) hypertension: Secondary | ICD-10-CM | POA: Diagnosis present

## 2019-03-22 DIAGNOSIS — G9340 Encephalopathy, unspecified: Secondary | ICD-10-CM | POA: Diagnosis not present

## 2019-03-22 DIAGNOSIS — N1832 Chronic kidney disease, stage 3b: Secondary | ICD-10-CM | POA: Diagnosis present

## 2019-03-22 DIAGNOSIS — Z794 Long term (current) use of insulin: Secondary | ICD-10-CM

## 2019-03-22 DIAGNOSIS — Z841 Family history of disorders of kidney and ureter: Secondary | ICD-10-CM | POA: Diagnosis not present

## 2019-03-22 DIAGNOSIS — Z66 Do not resuscitate: Secondary | ICD-10-CM | POA: Diagnosis present

## 2019-03-22 DIAGNOSIS — L8990 Pressure ulcer of unspecified site, unspecified stage: Secondary | ICD-10-CM | POA: Diagnosis present

## 2019-03-22 DIAGNOSIS — Z87891 Personal history of nicotine dependence: Secondary | ICD-10-CM | POA: Diagnosis not present

## 2019-03-22 DIAGNOSIS — Z833 Family history of diabetes mellitus: Secondary | ICD-10-CM

## 2019-03-22 DIAGNOSIS — I1310 Hypertensive heart and chronic kidney disease without heart failure, with stage 1 through stage 4 chronic kidney disease, or unspecified chronic kidney disease: Secondary | ICD-10-CM | POA: Diagnosis present

## 2019-03-22 DIAGNOSIS — E1100 Type 2 diabetes mellitus with hyperosmolarity without nonketotic hyperglycemic-hyperosmolar coma (NKHHC): Secondary | ICD-10-CM | POA: Diagnosis present

## 2019-03-22 DIAGNOSIS — N390 Urinary tract infection, site not specified: Secondary | ICD-10-CM | POA: Diagnosis present

## 2019-03-22 DIAGNOSIS — J1282 Pneumonia due to coronavirus disease 2019: Secondary | ICD-10-CM | POA: Diagnosis present

## 2019-03-22 DIAGNOSIS — Z515 Encounter for palliative care: Secondary | ICD-10-CM

## 2019-03-22 DIAGNOSIS — G9341 Metabolic encephalopathy: Secondary | ICD-10-CM | POA: Diagnosis present

## 2019-03-22 DIAGNOSIS — E1122 Type 2 diabetes mellitus with diabetic chronic kidney disease: Secondary | ICD-10-CM | POA: Diagnosis present

## 2019-03-22 DIAGNOSIS — U071 COVID-19: Principal | ICD-10-CM | POA: Diagnosis present

## 2019-03-22 DIAGNOSIS — N19 Unspecified kidney failure: Secondary | ICD-10-CM | POA: Diagnosis present

## 2019-03-22 DIAGNOSIS — N170 Acute kidney failure with tubular necrosis: Secondary | ICD-10-CM | POA: Diagnosis present

## 2019-03-22 DIAGNOSIS — E1110 Type 2 diabetes mellitus with ketoacidosis without coma: Secondary | ICD-10-CM | POA: Diagnosis present

## 2019-03-22 DIAGNOSIS — Z781 Physical restraint status: Secondary | ICD-10-CM | POA: Diagnosis not present

## 2019-03-22 DIAGNOSIS — N179 Acute kidney failure, unspecified: Secondary | ICD-10-CM | POA: Diagnosis not present

## 2019-03-22 DIAGNOSIS — F0390 Unspecified dementia without behavioral disturbance: Secondary | ICD-10-CM | POA: Diagnosis present

## 2019-03-22 DIAGNOSIS — E860 Dehydration: Secondary | ICD-10-CM | POA: Diagnosis present

## 2019-03-22 DIAGNOSIS — R739 Hyperglycemia, unspecified: Secondary | ICD-10-CM

## 2019-03-22 DIAGNOSIS — E785 Hyperlipidemia, unspecified: Secondary | ICD-10-CM | POA: Diagnosis present

## 2019-03-22 LAB — POCT I-STAT EG7
Acid-Base Excess: 1 mmol/L (ref 0.0–2.0)
Bicarbonate: 26.7 mmol/L (ref 20.0–28.0)
Calcium, Ion: 1.11 mmol/L — ABNORMAL LOW (ref 1.15–1.40)
HCT: 33 % — ABNORMAL LOW (ref 39.0–52.0)
Hemoglobin: 11.2 g/dL — ABNORMAL LOW (ref 13.0–17.0)
O2 Saturation: 31 %
Patient temperature: 100.1
Potassium: 6.8 mmol/L (ref 3.5–5.1)
Sodium: 135 mmol/L (ref 135–145)
TCO2: 28 mmol/L (ref 22–32)
pCO2, Ven: 50.5 mmHg (ref 44.0–60.0)
pH, Ven: 7.335 (ref 7.250–7.430)
pO2, Ven: 22 mmHg — CL (ref 32.0–45.0)

## 2019-03-22 LAB — CBC WITH DIFFERENTIAL/PLATELET
Abs Immature Granulocytes: 0.02 10*3/uL (ref 0.00–0.07)
Basophils Absolute: 0 10*3/uL (ref 0.0–0.1)
Basophils Relative: 0 %
Eosinophils Absolute: 0 10*3/uL (ref 0.0–0.5)
Eosinophils Relative: 0 %
HCT: 34.5 % — ABNORMAL LOW (ref 39.0–52.0)
Hemoglobin: 11.2 g/dL — ABNORMAL LOW (ref 13.0–17.0)
Immature Granulocytes: 0 %
Lymphocytes Relative: 12 %
Lymphs Abs: 0.8 10*3/uL (ref 0.7–4.0)
MCH: 28.7 pg (ref 26.0–34.0)
MCHC: 32.5 g/dL (ref 30.0–36.0)
MCV: 88.5 fL (ref 80.0–100.0)
Monocytes Absolute: 0.5 10*3/uL (ref 0.1–1.0)
Monocytes Relative: 7 %
Neutro Abs: 5.6 10*3/uL (ref 1.7–7.7)
Neutrophils Relative %: 81 %
Platelets: 194 10*3/uL (ref 150–400)
RBC: 3.9 MIL/uL — ABNORMAL LOW (ref 4.22–5.81)
RDW: 13.3 % (ref 11.5–15.5)
WBC: 7 10*3/uL (ref 4.0–10.5)
nRBC: 0 % (ref 0.0–0.2)

## 2019-03-22 LAB — URINALYSIS, MICROSCOPIC (REFLEX)

## 2019-03-22 LAB — URINALYSIS, ROUTINE W REFLEX MICROSCOPIC
Bilirubin Urine: NEGATIVE
Glucose, UA: >=500 mg/dL — AB
Ketones, ur: NEGATIVE mg/dL
Leukocytes,Ua: NEGATIVE
Nitrite: NEGATIVE
Protein, ur: >300 mg/dL — AB
Specific Gravity, Urine: >1.03 — ABNORMAL HIGH (ref 1.005–1.030)
pH: 5.5 (ref 5.0–8.0)

## 2019-03-22 LAB — COMPREHENSIVE METABOLIC PANEL
ALT: 17 U/L (ref 0–44)
AST: 31 U/L (ref 15–41)
Albumin: 2.9 g/dL — ABNORMAL LOW (ref 3.5–5.0)
Alkaline Phosphatase: 41 U/L (ref 38–126)
Anion gap: 15 (ref 5–15)
BUN: 112 mg/dL — ABNORMAL HIGH (ref 8–23)
CO2: 21 mmol/L — ABNORMAL LOW (ref 22–32)
Calcium: 8.4 mg/dL — ABNORMAL LOW (ref 8.9–10.3)
Chloride: 102 mmol/L (ref 98–111)
Creatinine, Ser: 5.78 mg/dL — ABNORMAL HIGH (ref 0.61–1.24)
GFR calc Af Amer: 9 mL/min — ABNORMAL LOW (ref >60)
GFR calc non Af Amer: 8 mL/min — ABNORMAL LOW (ref >60)
Glucose, Bld: 505 mg/dL (ref 70–99)
Potassium: 4.5 mmol/L (ref 3.5–5.1)
Sodium: 138 mmol/L (ref 135–145)
Total Bilirubin: 0.5 mg/dL (ref 0.3–1.2)
Total Protein: 7.5 g/dL (ref 6.5–8.1)

## 2019-03-22 LAB — CBG MONITORING, ED
Glucose-Capillary: 406 mg/dL — ABNORMAL HIGH (ref 70–99)
Glucose-Capillary: 436 mg/dL — ABNORMAL HIGH (ref 70–99)
Glucose-Capillary: 454 mg/dL — ABNORMAL HIGH (ref 70–99)

## 2019-03-22 MED ORDER — DEXTROSE-NACL 5-0.45 % IV SOLN: INTRAVENOUS | Status: DC

## 2019-03-22 MED ORDER — INSULIN REGULAR(HUMAN) IN NACL 100-0.9 UT/100ML-% IV SOLN
INTRAVENOUS | Status: DC
  Administered 2019-03-22: 11.5 [IU]/h via INTRAVENOUS
  Administered 2019-03-23: 1.7 [IU]/h via INTRAVENOUS
  Filled 2019-03-22: qty 100

## 2019-03-22 MED ORDER — DEXTROSE 50 % IV SOLN
0.0000 mL | INTRAVENOUS | Status: DC | PRN
  Administered 2019-03-27 (×3): 50 mL via INTRAVENOUS
  Filled 2019-03-22 (×4): qty 50

## 2019-03-22 MED ORDER — SODIUM CHLORIDE 0.9 % IV SOLN: INTRAVENOUS | Status: DC

## 2019-03-22 MED ORDER — SODIUM CHLORIDE 0.9 % IV BOLUS
1000.0000 mL | Freq: Once | INTRAVENOUS | Status: AC
  Administered 2019-03-22: 1000 mL via INTRAVENOUS

## 2019-03-22 MED ORDER — SODIUM CHLORIDE 0.9 % IV SOLN
1.0000 g | Freq: Once | INTRAVENOUS | Status: AC
  Administered 2019-03-22: 1 g via INTRAVENOUS
  Filled 2019-03-22: qty 10

## 2019-03-22 NOTE — ED Notes (Signed)
ED Provider at bedside. 

## 2019-03-22 NOTE — ED Provider Notes (Signed)
[facility] EMERGENCY DEPARTMENT Provider Note   CSN: [id number] Arrival date & time: 03/22/19  1901     History Chief Complaint  Patient presents with  . Hyperglycemia    Marcus Weaver is a 85 y.o. male.  HPI      85yo male with history of htn, hlpd, CKD sees [facility]  More confused for about 1 week, stopped eating, drinking, taking medication since last week Thursday went to the [employer] shop, had high sugar, passed out in chair.  Got hair cut, got him home Severe fatigue, doesn't want to get up, urinating on self Yesterday stopped using his left arm Usually walks independently with walker Now can't get around Hx of dementia does not report pain  No known fevers Blood sugars 500s, refusing medicine, not taking insulin, more agitated  No cough at home, wheezing Grandaughter providing hx Marcus Weaver is POA    Past Medical History:  Diagnosis Date  . Abnormal EKG 08/14/2015  . CKD (chronic kidney disease), stage II   . Diabetes mellitus    type2  . Hyperlipidemia   . Hypertension   . Hypertensive heart disease 08/14/2015  . Tachycardia    a. HR 132 at endo OV 06/2015, no EKG at that time.  Marland Kitchen UTI (lower urinary tract infection)     Patient Active Problem List   Diagnosis Date Noted  . Acute encephalopathy 03/22/2019  . Essential hypertension 10/13/2015  . Hypertensive heart disease 08/14/2015  . Abnormal EKG 08/14/2015  . Diabetes ([place]) 07/12/2015  . Tachycardia 07/11/2015  . CKD (chronic kidney disease) stage 4, GFR 15-29 ml/min (HCC) 05/18/2015  . Dementia ([place]) 05/15/2015  . Numbness in both hands 05/05/2015  . Polyuria 01/02/2015  . Broken teeth 06/01/2013  . Physical exam, annual 11/05/2011  . Hyperlipidemia associated with type 2 diabetes mellitus ([place]) 02/10/2010  . TOBACCO ABUSE 01/24/2007  . GLAUCOMA 01/24/2007  . CATARACTS 01/24/2007  . CONSTIPATION 01/24/2007    Past Surgical History:  Procedure Laterality Date  .  CATARACT EXTRACTION         Family History  Problem Relation Age of Onset  . Diabetes Other        1st degree relative  . Hypertension Other   . Kidney disease Mother   . Cancer Neg Hx     Social History   Tobacco Use  . Smoking status: Former Smoker    Types: Cigarettes    Quit date: 06/25/2015    Years since quitting: 3.7  . Smokeless tobacco: Never Used  . Tobacco comment: smokes 3 or 4 cigarettes /day  Substance Use Topics  . Alcohol use: No  . Drug use: No    Home Medications Prior to Admission medications   Medication Sig Start Date End Date Taking? Authorizing Provider  amLODipine (NORVASC) 5 MG tablet Take 1 tablet (5 mg total) by mouth daily. Please make an overdue appt with Dr. Radford Pax before anymore refills. 3rd and Final 01/18/17   Sueanne Margarita, MD  calcitRIOL (ROCALTROL) 0.25 MCG capsule Take 1 capsule by mouth daily. 05/29/18   [provider]  carvedilol (COREG) 6.25 MG tablet Take 1 tablet (6.25 mg total) by mouth 2 (two) times daily. Patient taking differently: Take 12.5 mg by mouth 2 (two) times daily.  08/14/15   Sueanne Margarita, MD  furosemide (LASIX) 80 MG tablet Take 2 tablets in the morning and 1 tablet at noon 09/14/16   [provider]  gentamicin cream (GARAMYCIN) 0.1 %  Apply 1 application topically 2 (two) times daily. 02/28/19   Marzetta Board, DPM  glucose blood (ONETOUCH VERIO) test strip 1 each by Other route 2 (two) times daily. E11.9 01/05/19   Renato Shin, MD  Insulin NPH, Human,, Isophane, (HUMULIN N KWIKPEN) 100 UNIT/ML Kiwkpen Inject 60 Units into the skin every morning. And pen needles 1/day 02/02/19   Renato Shin, MD  lisinopril (ZESTRIL) 20 MG tablet Take 20 mg by mouth daily.  10/26/18   [provider]  QUEtiapine (SEROQUEL) 25 MG tablet TAKE 1 TABLET(25 MG) BY MOUTH AT BEDTIME 03/09/19   [name], MD  tamsulosin (FLOMAX) 0.4 MG CAPS capsule Take 0.4 mg by mouth daily. 02/26/19   [provider]    Allergies    Patient has no known allergies.  Review of Systems   Review of Systems  Unable to perform ROS: Dementia  Constitutional: Positive for activity change, appetite change and fatigue. Negative for fever.  Respiratory: Negative for cough and shortness of breath.   Cardiovascular: Negative for chest pain.  Gastrointestinal: Negative for abdominal pain, nausea and vomiting.  Skin: Negative for rash.  Neurological: Positive for weakness.    Physical Exam Updated Vital Signs BP (!) 154/119   Pulse 99   Temp 100.1 F (37.8 C) (Rectal)   Resp (!) 22   Wt 95 kg   SpO2 95%   BMI 31.84 kg/m   Physical Exam Vitals and nursing note reviewed.  Constitutional:      General: He is not in acute distress.    Appearance: He is well-developed. He is ill-appearing. He is not diaphoretic.  HENT:     Head: Normocephalic and atraumatic.  Eyes:     Conjunctiva/sclera: Conjunctivae normal.  Cardiovascular:     Rate and Rhythm: Normal rate and regular rhythm.  Pulmonary:     Effort: Pulmonary effort is normal. No respiratory distress.  Abdominal:     General: There is no distension.     Palpations: Abdomen is soft.     Tenderness: There is no abdominal tenderness. There is no guarding.  Musculoskeletal:     Cervical back: Normal range of motion.  Skin:    General: Skin is warm and dry.  Neurological:     Mental Status: He is alert.     Comments: Oriented to self Moaning, not otherwise answering questions, intermittently follows commands Normal strength of upper extremities, no pronator drift, requires redirection, no facial droop.  Bilat LE with weakness, not following commands to lift but will flex and knee and hip to pain     ED Results / Procedures / Treatments   Labs (all labs ordered are listed, but only abnormal results are displayed) Labs Reviewed  SARS CORONAVIRUS 2 AG (30 MIN TAT) - Abnormal; Notable for the following components:      Result  Value   SARS Coronavirus 2 Ag POSITIVE (*)    All other components within normal limits  CBC WITH DIFFERENTIAL/PLATELET - Abnormal; Notable for the following components:   RBC 3.90 (*)    Hemoglobin 11.2 (*)    HCT 34.5 (*)    All other components within normal limits  COMPREHENSIVE METABOLIC PANEL - Abnormal; Notable for the following components:   CO2 21 (*)    Glucose, Bld 505 (*)    BUN 112 (*)    Creatinine, Ser 5.78 (*)    Calcium 8.4 (*)    Albumin 2.9 (*)    GFR calc non  Af Amer 8 (*)    GFR calc Af Amer 9 (*)    All other components within normal limits  URINALYSIS, ROUTINE W REFLEX MICROSCOPIC - Abnormal; Notable for the following components:   APPearance CLOUDY (*)    Specific Gravity, Urine >1.030 (*)    Glucose, UA >=500 (*)    Hgb urine dipstick LARGE (*)    Protein, ur >300 (*)    All other components within normal limits  URINALYSIS, MICROSCOPIC (REFLEX) - Abnormal; Notable for the following components:   Bacteria, UA MANY (*)    All other components within normal limits  CBG MONITORING, ED - Abnormal; Notable for the following components:   Glucose-Capillary 436 (*)    All other components within normal limits  POCT I-STAT EG7 - Abnormal; Notable for the following components:   pO2, Ven 22.0 (*)    Potassium 6.8 (*)    Calcium, Ion 1.11 (*)    HCT 33.0 (*)    Hemoglobin 11.2 (*)    All other components within normal limits  CBG MONITORING, ED - Abnormal; Notable for the following components:   Glucose-Capillary 454 (*)    All other components within normal limits  CBG MONITORING, ED - Abnormal; Notable for the following components:   Glucose-Capillary 406 (*)    All other components within normal limits  CBG MONITORING, ED - Abnormal; Notable for the following components:   Glucose-Capillary 358 (*)    All other components within normal limits  URINE CULTURE  SARS CORONAVIRUS 2 (TAT 6-24 HRS)  I-STAT VENOUS BLOOD GAS, ED    EKG EKG  Interpretation  Date/Time:  Thursday March 22 2019 22:31:42 EST Ventricular Rate:  108 PR Interval:    QRS Duration: 84 QT Interval:  345 QTC Calculation: 403 R Axis:   71 Text Interpretation: Sinus tachycardia Ventricular bigeminy Baseline wander in lead(s) V4 PVCs more frequent Confirmed by Molpus, John [phone]) on 03/22/2019 10:40:37 PM Also confirmed by Gareth Morgan [phone])  on 03/22/2019 11:47:58 PM   Radiology CT Head Wo Contrast  Result Date: 03/22/2019 CLINICAL DATA:  Altered mental status EXAM: CT HEAD WITHOUT CONTRAST TECHNIQUE: Contiguous axial images were obtained from the base of the skull through the vertex without intravenous contrast. COMPARISON:  CT brain 05/15/2015 FINDINGS: Brain: No acute territorial infarction, hemorrhage, or intracranial mass is visualized. Moderate atrophy, slightly progressed since 2017. Moderate hypodensity in the white matter, likely chronic small vessel ischemic change. Moderate enlargement of the ventricles, slightly progressed since 2017. Vascular: No hyperdense vessels.  Carotid vascular calcification Skull: Normal. Negative for fracture or focal lesion. Sinuses/Orbits: Mild mucosal thickening in the sinuses. Old fractures of the medial walls of both orbits Other: None IMPRESSION: 1. No definite CT evidence for acute intracranial abnormality. 2. Moderate ventricular enlargement, slightly increased since 2017, likely related to progression of atrophy. 3. Moderate hypodensity in the white matter consistent with chronic small vessel ischemic change Electronically Signed   By: Donavan Foil M.D.   On: 03/22/2019 19:48    Procedures .Critical Care Performed by: Gareth Morgan, MD Authorized by: Gareth Morgan, MD   Critical care provider statement:    Critical care time (minutes):  30   Critical care was necessary to treat or prevent imminent or life-threatening deterioration of the following conditions:  Metabolic crisis   Critical care  was time spent personally by me on the following activities:  Examination of patient, review of old charts, re-evaluation of patient's condition, ordering and review of radiographic  studies, ordering and review of laboratory studies and ordering and performing treatments and interventions   (including critical care time)  Medications Ordered in ED Medications  insulin regular, human (MYXREDLIN) 100 units/ 100 mL infusion ( Intravenous Rate/Dose Verify 03/23/19 0023)  0.9 %  sodium chloride infusion (has no administration in time range)  dextrose 5 %-0.45 % sodium chloride infusion (has no administration in time range)  dextrose 50 % solution 0-50 mL (has no administration in time range)  sodium chloride 0.9 % bolus 1,000 mL (1,000 mLs Intravenous New Bag/Given 03/22/19 2237)  cefTRIAXone (ROCEPHIN) 1 g in sodium chloride 0.9 % 100 mL IVPB ( Intravenous Stopped 03/22/19 2301)    ED Course  I have reviewed the triage vital signs and the nursing notes.  Pertinent labs & imaging results that were available during my care of the patient were reviewed by me and considered in my medical decision making (see chart for details).    MDM Rules/Calculators/A&P                      85yo male with history of dementia, DM, CKD, hypertension, hyperlipidemia presents with concern for severe generalized weakness, fatigue, hyperglycemia.  No history of trauma, granddaughter does report she thought that left sided arm weakness appeared to be worse, however exam is nonfocal with generalized weakness, LE worse than UE and no asymmetry and have low suspicion for CVA. CT head with findings of chronic small vessel change and likely progression of atrophy.  EKG with bigeminy no other significant abnormalities.  Labs significant for hyperglycemia and severe acute on chronic kidney injury with BUN 112.  Bicarb 21 without significant acidosis, AG 15, no signs of DKA.  Suspect worsening renal failure secondary to  decreased po intake, dehydration by history. No problems with urination, no abdominal tenderness.  Will plan on foley placement for I/os.  Has stated in the past that he would not want dialysis.  UA with bacteria on I/o will treat for possible UTI.  Temperature 100.1 rectally, not technically febrile, otherwise no signs of sepsis. Will continue to monitor.   Discussed with Dr. Myna Hidalgo. Will place on hyperglycemia protocol insulin gtt. Given IV fluids for dehydration and acute on chronic renal failure.  COVID 19 testing returned positive, likely contributing also to patient's dehydration, fatigue and encephalopathy.     Final Clinical Impression(s) / ED Diagnoses Final diagnoses:  Hyperglycemia  Uremia  COVID-19  Encephalopathy  Urinary tract infection without hematuria, site unspecified    Rx / DC Orders ED Discharge Orders    None       Gareth Morgan, MD 03/23/19 [phone]

## 2019-03-22 NOTE — ED Notes (Signed)
   CBG 436   

## 2019-03-22 NOTE — ED Notes (Signed)
Spoke with Maudie Mercury, [facility] transfer

## 2019-03-22 NOTE — ED Notes (Addendum)
Dr. Barbee Cough aware of blood sugar of 505

## 2019-03-22 NOTE — ED Notes (Addendum)
Pt's daughter states he has been confused for 1 week and not taking his medications or insulin.

## 2019-03-22 NOTE — ED Triage Notes (Signed)
Daughter states CBG is 500 x  1 week .

## 2019-03-23 ENCOUNTER — Inpatient Hospital Stay (HOSPITAL_BASED_OUTPATIENT_CLINIC_OR_DEPARTMENT_OTHER): Payer: Medicare Other

## 2019-03-23 DIAGNOSIS — J1282 Pneumonia due to coronavirus disease 2019: Secondary | ICD-10-CM | POA: Diagnosis present

## 2019-03-23 DIAGNOSIS — E1100 Type 2 diabetes mellitus with hyperosmolarity without nonketotic hyperglycemic-hyperosmolar coma (NKHHC): Secondary | ICD-10-CM | POA: Diagnosis present

## 2019-03-23 LAB — CBG MONITORING, ED
Glucose-Capillary: 110 mg/dL — ABNORMAL HIGH (ref 70–99)
Glucose-Capillary: 120 mg/dL — ABNORMAL HIGH (ref 70–99)
Glucose-Capillary: 124 mg/dL — ABNORMAL HIGH (ref 70–99)
Glucose-Capillary: 142 mg/dL — ABNORMAL HIGH (ref 70–99)
Glucose-Capillary: 149 mg/dL — ABNORMAL HIGH (ref 70–99)
Glucose-Capillary: 150 mg/dL — ABNORMAL HIGH (ref 70–99)
Glucose-Capillary: 154 mg/dL — ABNORMAL HIGH (ref 70–99)
Glucose-Capillary: 210 mg/dL — ABNORMAL HIGH (ref 70–99)
Glucose-Capillary: 248 mg/dL — ABNORMAL HIGH (ref 70–99)
Glucose-Capillary: 358 mg/dL — ABNORMAL HIGH (ref 70–99)

## 2019-03-23 LAB — CBC WITH DIFFERENTIAL/PLATELET
Abs Immature Granulocytes: 0.09 10*3/uL — ABNORMAL HIGH (ref 0.00–0.07)
Basophils Absolute: 0 10*3/uL (ref 0.0–0.1)
Basophils Relative: 0 %
Eosinophils Absolute: 0 10*3/uL (ref 0.0–0.5)
Eosinophils Relative: 0 %
HCT: 32.3 % — ABNORMAL LOW (ref 39.0–52.0)
Hemoglobin: 10.5 g/dL — ABNORMAL LOW (ref 13.0–17.0)
Immature Granulocytes: 2 %
Lymphocytes Relative: 8 %
Lymphs Abs: 0.5 10*3/uL — ABNORMAL LOW (ref 0.7–4.0)
MCH: 28.8 pg (ref 26.0–34.0)
MCHC: 32.5 g/dL (ref 30.0–36.0)
MCV: 88.7 fL (ref 80.0–100.0)
Monocytes Absolute: 0.2 10*3/uL (ref 0.1–1.0)
Monocytes Relative: 4 %
Neutro Abs: 5.2 10*3/uL (ref 1.7–7.7)
Neutrophils Relative %: 86 %
Platelets: 210 10*3/uL (ref 150–400)
RBC: 3.64 MIL/uL — ABNORMAL LOW (ref 4.22–5.81)
RDW: 13.5 % (ref 11.5–15.5)
WBC: 6 10*3/uL (ref 4.0–10.5)
nRBC: 0 % (ref 0.0–0.2)

## 2019-03-23 LAB — GLUCOSE, CAPILLARY: Glucose-Capillary: 280 mg/dL — ABNORMAL HIGH (ref 70–99)

## 2019-03-23 LAB — LACTATE DEHYDROGENASE: LDH: 337 U/L — ABNORMAL HIGH (ref 98–192)

## 2019-03-23 LAB — ABO/RH: ABO/RH(D): B POS

## 2019-03-23 LAB — BASIC METABOLIC PANEL
Anion gap: 10 (ref 5–15)
BUN: 112 mg/dL — ABNORMAL HIGH (ref 8–23)
CO2: 23 mmol/L (ref 22–32)
Calcium: 8.3 mg/dL — ABNORMAL LOW (ref 8.9–10.3)
Chloride: 109 mmol/L (ref 98–111)
Creatinine, Ser: 5.9 mg/dL — ABNORMAL HIGH (ref 0.61–1.24)
GFR calc Af Amer: 9 mL/min — ABNORMAL LOW (ref >60)
GFR calc non Af Amer: 8 mL/min — ABNORMAL LOW (ref >60)
Glucose, Bld: 130 mg/dL — ABNORMAL HIGH (ref 70–99)
Potassium: 3.9 mmol/L (ref 3.5–5.1)
Sodium: 142 mmol/L (ref 135–145)

## 2019-03-23 LAB — SARS CORONAVIRUS 2 (TAT 6-24 HRS): SARS Coronavirus 2: POSITIVE — AB

## 2019-03-23 LAB — SARS CORONAVIRUS 2 AG (30 MIN TAT): SARS Coronavirus 2 Ag: POSITIVE — AB

## 2019-03-23 MED ORDER — IPRATROPIUM-ALBUTEROL 20-100 MCG/ACT IN AERS
2.0000 | INHALATION_SPRAY | Freq: Four times a day (QID) | RESPIRATORY_TRACT | Status: DC
  Administered 2019-03-24 – 2019-04-01 (×23): 2 via RESPIRATORY_TRACT
  Filled 2019-03-23: qty 4

## 2019-03-23 MED ORDER — ASCORBIC ACID 500 MG PO TABS
500.0000 mg | ORAL_TABLET | Freq: Every day | ORAL | Status: DC
  Administered 2019-03-24 – 2019-04-01 (×8): 500 mg via ORAL
  Filled 2019-03-23 (×8): qty 1

## 2019-03-23 MED ORDER — ONDANSETRON HCL 4 MG PO TABS: 4.0000 mg | ORAL_TABLET | Freq: Four times a day (QID) | ORAL | Status: DC | PRN

## 2019-03-23 MED ORDER — SODIUM CHLORIDE 0.9 % IV SOLN
100.0000 mg | Freq: Every day | INTRAVENOUS | Status: AC
  Administered 2019-03-24 – 2019-03-27 (×4): 100 mg via INTRAVENOUS
  Filled 2019-03-23 (×5): qty 20

## 2019-03-23 MED ORDER — ONDANSETRON HCL 4 MG/2ML IJ SOLN: 4.0000 mg | Freq: Four times a day (QID) | INTRAMUSCULAR | Status: DC | PRN

## 2019-03-23 MED ORDER — INSULIN DETEMIR 100 UNIT/ML ~~LOC~~ SOLN
0.1000 [IU]/kg | Freq: Two times a day (BID) | SUBCUTANEOUS | Status: DC
  Administered 2019-03-24 (×2): 10 [IU] via SUBCUTANEOUS
  Filled 2019-03-23 (×2): qty 0.1

## 2019-03-23 MED ORDER — CHLORHEXIDINE GLUCONATE CLOTH 2 % EX PADS
6.0000 | MEDICATED_PAD | Freq: Every day | CUTANEOUS | Status: DC
  Administered 2019-03-23 – 2019-03-29 (×7): 6 via TOPICAL

## 2019-03-23 MED ORDER — INSULIN ASPART 100 UNIT/ML ~~LOC~~ SOLN
0.0000 [IU] | SUBCUTANEOUS | Status: DC
  Administered 2019-03-24 (×2): 4 [IU] via SUBCUTANEOUS
  Administered 2019-03-24: 11 [IU] via SUBCUTANEOUS

## 2019-03-23 MED ORDER — NEPRO/CARBSTEADY PO LIQD
237.0000 mL | Freq: Two times a day (BID) | ORAL | Status: DC
  Administered 2019-03-24 – 2019-03-29 (×8): 237 mL via ORAL
  Filled 2019-03-23 (×18): qty 237

## 2019-03-23 MED ORDER — ZINC SULFATE 220 (50 ZN) MG PO CAPS
220.0000 mg | ORAL_CAPSULE | Freq: Every day | ORAL | Status: DC
  Administered 2019-03-24 – 2019-04-01 (×8): 220 mg via ORAL
  Filled 2019-03-23 (×8): qty 1

## 2019-03-23 MED ORDER — HEPARIN SODIUM (PORCINE) 5000 UNIT/ML IJ SOLN
5000.0000 [IU] | Freq: Three times a day (TID) | INTRAMUSCULAR | Status: DC
  Administered 2019-03-23: 16:00:00 5000 [IU] via SUBCUTANEOUS
  Filled 2019-03-23: qty 1

## 2019-03-23 MED ORDER — GUAIFENESIN-DM 100-10 MG/5ML PO SYRP: 10.0000 mL | ORAL_SOLUTION | ORAL | Status: DC | PRN

## 2019-03-23 MED ORDER — INSULIN GLARGINE 100 UNIT/ML ~~LOC~~ SOLN
30.0000 [IU] | Freq: Once | SUBCUTANEOUS | Status: AC
  Administered 2019-03-23: 13:00:00 30 [IU] via SUBCUTANEOUS
  Filled 2019-03-23: qty 1

## 2019-03-23 MED ORDER — ACETAMINOPHEN 650 MG RE SUPP
RECTAL | Status: AC
  Filled 2019-03-23: qty 1

## 2019-03-23 MED ORDER — DEXAMETHASONE SODIUM PHOSPHATE 10 MG/ML IJ SOLN
6.0000 mg | Freq: Once | INTRAMUSCULAR | Status: AC
  Administered 2019-03-23: 06:00:00 6 mg via INTRAVENOUS
  Filled 2019-03-23: qty 1

## 2019-03-23 MED ORDER — FUROSEMIDE 10 MG/ML IJ SOLN
40.0000 mg | Freq: Once | INTRAMUSCULAR | Status: AC
  Administered 2019-03-23: 06:00:00 40 mg via INTRAVENOUS
  Filled 2019-03-23: qty 4

## 2019-03-23 MED ORDER — ACETAMINOPHEN 650 MG RE SUPP: 650.0000 mg | Freq: Four times a day (QID) | RECTAL | Status: DC | PRN

## 2019-03-23 MED ORDER — INSULIN ASPART PROT & ASPART (70-30 MIX) 100 UNIT/ML ~~LOC~~ SUSP
0.0000 [IU] | Freq: Four times a day (QID) | SUBCUTANEOUS | Status: DC
  Filled 2019-03-23: qty 10

## 2019-03-23 MED ORDER — DEXAMETHASONE SODIUM PHOSPHATE 10 MG/ML IJ SOLN
6.0000 mg | INTRAMUSCULAR | Status: DC
  Administered 2019-03-24 – 2019-03-25 (×2): 6 mg via INTRAVENOUS
  Filled 2019-03-23 (×2): qty 1

## 2019-03-23 MED ORDER — ENOXAPARIN SODIUM 30 MG/0.3ML ~~LOC~~ SOLN
30.0000 mg | SUBCUTANEOUS | Status: DC
  Administered 2019-03-24: 30 mg via SUBCUTANEOUS
  Filled 2019-03-23: qty 0.3

## 2019-03-23 MED ORDER — ACETAMINOPHEN 325 MG PO TABS: 650.0000 mg | ORAL_TABLET | Freq: Four times a day (QID) | ORAL | Status: DC | PRN

## 2019-03-23 MED ORDER — HYDROCOD POLST-CPM POLST ER 10-8 MG/5ML PO SUER: 5.0000 mL | Freq: Two times a day (BID) | ORAL | Status: DC | PRN

## 2019-03-23 MED ORDER — SODIUM CHLORIDE 0.9 % IV SOLN
200.0000 mg | Freq: Once | INTRAVENOUS | Status: AC
  Administered 2019-03-23: 09:00:00 200 mg via INTRAVENOUS
  Filled 2019-03-23: qty 40

## 2019-03-23 MED ORDER — ACETAMINOPHEN 650 MG RE SUPP
650.0000 mg | Freq: Once | RECTAL | Status: AC
  Administered 2019-03-23: 04:00:00 650 mg via RECTAL

## 2019-03-23 NOTE — Progress Notes (Signed)
Placed patient on 10 liter  Hi-Flo nasal cannula due to patient's increased WOB, RR, and SPO2 falling to 87%.  Patients SPO2 increased to 98% and his WOB and RR has decreased.

## 2019-03-23 NOTE — Plan of Care (Signed)
Discharge home.

## 2019-03-23 NOTE — ED Notes (Signed)
Continue to wait for [facility] for transport

## 2019-03-23 NOTE — ED Notes (Signed)
Alert, verbally responsive, smiling, at nurse. Conversation confused, follows simple commands

## 2019-03-23 NOTE — ED Notes (Signed)
Close all IV lines per RN Baxter Hire

## 2019-03-23 NOTE — Progress Notes (Signed)
Inpatient Diabetes Program Recommendations  AACE/ADA: New Consensus Statement on Inpatient Glycemic Control (2015)  Target Ranges:  Prepandial:   less than 140 mg/dL      Peak postprandial:   less than 180 mg/dL (1-2 hours)      Critically ill patients:  140 - 180 mg/dL   Lab Results  Component Value Date   GLUCAP 142 (H) 03/23/2019   HGBA1C 9.2 (A) 02/02/2019    Review of Glycemic Control  Diabetes history: DM2 Outpatient Diabetes medications: Humulin N 60 units QAM Current orders for Inpatient glycemic control: IV insulin per [facility]  HgbA1C - 9.2%. Ready to transition off insulin drip. Discussed transition under Pt Reports in [facility] with RN. Blood sugars within goal.  Inpatient Diabetes Program Recommendations:     Lantus 30 units Q24H (give 2 hours prior to discontinuation of insulin drip) Novolog 0-9 units Q6H When po intake begins, change to tidwc.  Follow closely.  Thank you. Lorenda Peck, RD, LDN, CDE Inpatient Diabetes Coordinator [phone]

## 2019-03-23 NOTE — Progress Notes (Signed)
[facility] to [facility] transfer, initially per Dr. Myna Hidalgo:  Update 2: Called by RN that pt now has 10 lpm O2 requirement, from no hypoxia a couple hours ago. Asked to have Dr. Florina Ou assess the patient there for this acute change, ?likely needs to be diuresed.   Update: COVID positive. On room air with RR low 20's. Still accepted to [facility] d/t no O2 requirement.   Discussed with Dr. Billy Fischer. 62 yom with CKD IIIb/IV, dementia, and IDDM p/w lethargy, increased confusion, not taking his medications recently. Found to have glucose 505 and AKI, BUN 112. No acute findings on head CT. Given 1 liter IVF and being started on insulin. Had previously stated he wouldn't want HD per ED physician.      Patient's gap has closed and glucose normalized so he has been transitioned off insulin drip.   Overnight, he required HFNC, now on 2L [place] O2.  Previously stated he did not want HD, plan to not attempt this.  He is DNR/DNI.  Can change order to telemetry at whichever facility can accommodate him soonest.  Since this patient does not wish to do HD, will transfer to [facility] at this time.   Carlyon Shadow, M.D.

## 2019-03-23 NOTE — ED Notes (Signed)
Eyeglasses remain with, on face

## 2019-03-23 NOTE — ED Notes (Addendum)
Patient repositioned.  O2 sats between 97 to 98% at 1 lpm hi flo [place].  RT, James, informed if I can d/c the insulin.

## 2019-03-23 NOTE — ED Provider Notes (Signed)
03/23/19 5:02 AM  Patient has required more oxygen and his respiratory rate is increased.  Repeat chest x-ray shows developing opacities in the right mid lower lung zone suspected to be COVID-19 pneumonia.  Dr. Myna Hidalgo recommends trial of IV Lasix.  We will also order remdesivir, Decadron and heparin per current [place] Covid boarding patient protocol.         Yasmine Kilbourne, Jenny Reichmann, MD 03/23/19 [phone]

## 2019-03-23 NOTE — ED Notes (Signed)
Spoke with Lorenda Peck, Diabetic Coordinator Nurse and informed her that patient has been on Insulin drip for awhile and had 7 times below 180 blood sugar result.  She stated that she will write a recommendation for an basal insulin.

## 2019-03-23 NOTE — ED Notes (Addendum)
Marcus Weaver, # [phone],  granddaughter with update regarding [facility], room assignment

## 2019-03-23 NOTE — ED Notes (Signed)
Report given to [place], receiving nurse at [facility]

## 2019-03-23 NOTE — ED Notes (Signed)
ED Provider at bedside. 

## 2019-03-23 NOTE — ED Notes (Signed)
O2 titrated to 1 lpm from 3 lpm.  O2 sat is 98%.

## 2019-03-23 NOTE — Progress Notes (Signed)
RT note: RT found patients HFNC out of nares and vital signs stable. RT titrated HFNC from 6L to 2L. Will continue to monitor.

## 2019-03-23 NOTE — ED Notes (Signed)
Pt turned down to 6L HFNC. Tolerating well.  Will continue to monitor

## 2019-03-23 NOTE — ED Notes (Signed)
Marcus Weaver spoke to Dr. Rogene Houston regarding transitioning insulin to SQ.  He did not want to change at this time.

## 2019-03-23 NOTE — ED Notes (Signed)
pts granddaughter Janett Billow [phone]

## 2019-03-23 NOTE — H&P (Signed)
History and Physical    Marcus Weaver [id number] DOB: July 14, 1930 DOA: 03/22/2019  PCP: [name], MD   Patient coming from: Home.   I have personally briefly reviewed patient's old medical records in [place]  Chief Complaint: "High blood sugar and AMS."  HPI: Marcus Weaver is a 86 y.o. male with medical history significant of abnormal EKG, CKD, type 2 diabetes, hyperlipidemia, hypertension, hypertensive heart disease, tachycardia, history of UTI, dementia who was transferred from [facility] after presenting there with a history of hyperglycemia over 500 mg/dL for a week according to the patient's daughter.  Daughter also stated that he is stopped eating, drinking and taking his medications since the previous week.  On Thursday, he went to the barbershop and passed out due to hyperglycemia.  He ended up going home after this.  He normally is able to walk around with a walker assistance, but has been unable to do so for the past few days.  ED Course: CBC showed a hemoglobin 11.2 g/dL, but was otherwise normal.  Glucose 505, BUN 112 and creatinine 5.78 mg/dL.  Baseline creatinine is about 2.8 mg/dL.  He is urinalysis showed large glucosuria, proteinuria and hemoglobinuria.  There were many bacteria, but no WBC.  His SARS antigen was positive and he had elevated LDH, ferritin, CRP, D-dimer and fibrinogen.  CT head no acute on normalities, but multiple chronic findings.  2 chest radiographs were taken, no infiltrates on the first 1 and developing infiltrates on the follow-up film.  Review of Systems:   Unable to obtain due to the patient's mental status.  Past Medical History:  Diagnosis Date  . Abnormal EKG 08/14/2015  . CKD (chronic kidney disease), stage II   . Diabetes mellitus    type2  . Hyperlipidemia   . Hypertension   . Hypertensive heart disease 08/14/2015  . Tachycardia    a. HR 132 at endo OV 06/2015, no EKG at that time.  Marland Kitchen UTI (lower urinary tract infection)      Past Surgical History:  Procedure Laterality Date  . CATARACT EXTRACTION       reports that he quit smoking about 3 years ago. His smoking use included cigarettes. He has never used smokeless tobacco. He reports that he does not drink alcohol or use drugs.  No Known Allergies  Family History  Problem Relation Age of Onset  . Diabetes Other        1st degree relative  . Hypertension Other   . Kidney disease Mother   . Cancer Neg Hx    Prior to Admission medications   Medication Sig Start Date End Date Taking? Authorizing Provider  amLODipine (NORVASC) 5 MG tablet Take 1 tablet (5 mg total) by mouth daily. Please make an overdue appt with Dr. Radford Pax before anymore refills. 3rd and Final 01/18/17   Sueanne Margarita, MD  calcitRIOL (ROCALTROL) 0.25 MCG capsule Take 1 capsule by mouth daily. 05/29/18   [provider]  carvedilol (COREG) 6.25 MG tablet Take 1 tablet (6.25 mg total) by mouth 2 (two) times daily. Patient taking differently: Take 12.5 mg by mouth 2 (two) times daily.  08/14/15   Sueanne Margarita, MD  furosemide (LASIX) 80 MG tablet Take 2 tablets in the morning and 1 tablet at noon 09/14/16   [provider]  gentamicin cream (GARAMYCIN) 0.1 % Apply 1 application topically 2 (two) times daily. 02/28/19   Marzetta Board, DPM  glucose blood (ONETOUCH VERIO) test strip 1  each by Other route 2 (two) times daily. E11.9 01/05/19   Renato Shin, MD  Insulin NPH, Human,, Isophane, (HUMULIN N KWIKPEN) 100 UNIT/ML Kiwkpen Inject 60 Units into the skin every morning. And pen needles 1/day 02/02/19   Renato Shin, MD  lisinopril (ZESTRIL) 20 MG tablet Take 20 mg by mouth daily.  10/26/18   [provider]  QUEtiapine (SEROQUEL) 25 MG tablet TAKE 1 TABLET(25 MG) BY MOUTH AT BEDTIME 03/09/19   [name], MD  tamsulosin (FLOMAX) 0.4 MG CAPS capsule Take 0.4 mg by mouth daily. 02/26/19   [provider]    Physical Exam: Vitals:   03/23/19  1900 03/23/19 2000 03/23/19 2015 03/23/19 2200  BP: (!) 154/65 (!) 138/56  (!) 176/78  Pulse: 69 62 (!) 59 66  Resp: (!) 25 (!) 23 (!) 23 18  Temp:    (!) 97.5 F (36.4 C)  TempSrc:      SpO2: 94% 92% 93% 93%  Weight:        Constitutional: NAD, calm, comfortable Eyes: PERRL, lids and conjunctivae normal ENMT: Mucous membranes are very dry. Posterior pharynx clear of any exudate or lesions. Neck: normal, supple, no masses, no thyromegaly Respiratory: Good air movement with bilateral diffuse crackles. Normal respiratory effort. No accessory muscle use.  Cardiovascular: Bradycardic at 58 bpm, no murmurs / rubs / gallops. No extremity edema. 2+ pedal pulses. No carotid bruits.  Abdomen: Obese, nondistended.Bowel sounds positive.   Soft, no tenderness, no masses palpated. No hepatosplenomegaly.  Musculoskeletal: no clubbing / cyanosis. Good ROM, no contractures. Normal muscle tone.  Skin: no rashes, lesions, ulcers on very limited dermatological examination. Neurologic: CN 2-12 grossly intact.  Generalized weakness.  Does not follow commands. Psychiatric: Somnolent, oriented to name.   Labs on Admission: I have personally reviewed following labs and imaging studies  CBC: Recent Labs  Lab 03/19/19 1441 03/22/19 2011 03/22/19 2145  WBC 5.9 7.0  --   NEUTROABS  --  5.6  --   HGB 11.7* 11.2* 11.2*  HCT 35.6* 34.5* 33.0*  MCV 88.6 88.5  --   PLT 183 194  --    Basic Metabolic Panel: Recent Labs  Lab 03/19/19 1441 03/22/19 2011 03/22/19 2145 03/23/19 0604  NA 137 138 135 142  K 4.0 4.5 6.8* 3.9  CL 101 102  --  109  CO2 23 21*  --  23  GLUCOSE 366* 505*  --  130*  BUN 64* 112*  --  112*  CREATININE 4.14* 5.78*  --  5.90*  CALCIUM 8.5* 8.4*  --  8.3*   GFR: Estimated Creatinine Clearance: 10 mL/min (A) (by C-G formula based on SCr of 5.9 mg/dL (H)). Liver Function Tests: Recent Labs  Lab 03/22/19 2011  AST 31  ALT 17  ALKPHOS 41  BILITOT 0.5  PROT 7.5  ALBUMIN  2.9*   No results for input(s): LIPASE, AMYLASE in the last 168 hours. No results for input(s): AMMONIA in the last 168 hours. Coagulation Profile: No results for input(s): INR, PROTIME in the last 168 hours. Cardiac Enzymes: No results for input(s): CKTOTAL, CKMB, CKMBINDEX, TROPONINI in the last 168 hours. BNP (last 3 results) No results for input(s): PROBNP in the last 8760 hours. HbA1C: No results for input(s): HGBA1C in the last 72 hours. CBG: Recent Labs  Lab 03/23/19 0645 03/23/19 0816 03/23/19 0919 03/23/19 1038 03/23/19 1606  GLUCAP 110* 124* 120* 142* 210*   Lipid Profile: No results for input(s): CHOL, HDL, LDLCALC,  TRIG, CHOLHDL, LDLDIRECT in the last 72 hours. Thyroid Function Tests: No results for input(s): TSH, T4TOTAL, FREET4, T3FREE, THYROIDAB in the last 72 hours. Anemia Panel: No results for input(s): VITAMINB12, FOLATE, FERRITIN, TIBC, IRON, RETICCTPCT in the last 72 hours. Urine analysis:    Component Value Date/Time   COLORURINE YELLOW 03/22/2019 2130   APPEARANCEUR CLOUDY (A) 03/22/2019 2130   LABSPEC >1.030 (H) 03/22/2019 2130   PHURINE 5.5 03/22/2019 2130   GLUCOSEU >=500 (A) 03/22/2019 2130   HGBUR LARGE (A) 03/22/2019 2130   BILIRUBINUR NEGATIVE 03/22/2019 2130   BILIRUBINUR negative 01/02/2015 1407   KETONESUR NEGATIVE 03/22/2019 2130   PROTEINUR >300 (A) 03/22/2019 2130   UROBILINOGEN 0.2 01/02/2015 1407   UROBILINOGEN 1.0 07/29/2009 0549   NITRITE NEGATIVE 03/22/2019 2130   LEUKOCYTESUR NEGATIVE 03/22/2019 2130    Radiological Exams on Admission: CT Head Wo Contrast  Result Date: 03/22/2019 CLINICAL DATA:  Altered mental status EXAM: CT HEAD WITHOUT CONTRAST TECHNIQUE: Contiguous axial images were obtained from the base of the skull through the vertex without intravenous contrast. COMPARISON:  CT brain 05/15/2015 FINDINGS: Brain: No acute territorial infarction, hemorrhage, or intracranial mass is visualized. Moderate atrophy, slightly  progressed since 2017. Moderate hypodensity in the white matter, likely chronic small vessel ischemic change. Moderate enlargement of the ventricles, slightly progressed since 2017. Vascular: No hyperdense vessels.  Carotid vascular calcification Skull: Normal. Negative for fracture or focal lesion. Sinuses/Orbits: Mild mucosal thickening in the sinuses. Old fractures of the medial walls of both orbits Other: None IMPRESSION: 1. No definite CT evidence for acute intracranial abnormality. 2. Moderate ventricular enlargement, slightly increased since 2017, likely related to progression of atrophy. 3. Moderate hypodensity in the white matter consistent with chronic small vessel ischemic change Electronically Signed   By: Donavan Foil M.D.   On: 03/22/2019 19:48   DG Chest Port 1 View  Result Date: 03/23/2019 CLINICAL DATA:  Worsening dyspnea.  COVID-19 positive. EXAM: PORTABLE CHEST 1 VIEW COMPARISON:  Radiograph 3 hours ago. FINDINGS: Unchanged heart size and mediastinal contours. Aortic atherosclerosis. Minimal vague airspace opacity in the mid lower right lung zone. Mild central bronchial thickening. No pulmonary edema, pleural effusion or pneumothorax. Stable osseous structures. IMPRESSION: 1. Developing opacities in the right mid lower lung zone, likely related to COVID-19 pneumonia. 2. Central bronchial thickening. 3.  Aortic Atherosclerosis (ICD10-I70.0). Electronically Signed   By: Keith Rake M.D.   On: 03/23/2019 04:42   DG Chest Portable 1 View  Result Date: 03/23/2019 CLINICAL DATA:  Initial evaluation for acute altered mental status. EXAM: PORTABLE CHEST 1 VIEW COMPARISON:  Prior radiograph from 07/29/2009. FINDINGS: The cardiac and mediastinal silhouettes are stable in size and contour, and remain within normal limits. Aortic atherosclerosis. The lungs are normally inflated. No airspace consolidation, pleural effusion, or pulmonary edema. No pneumothorax. No acute osseous abnormality.  IMPRESSION: 1. No radiographic evidence for active cardiopulmonary disease. 2.  Aortic Atherosclerosis (ICD10-I70.0). Electronically Signed   By: Jeannine Boga M.D.   On: 03/23/2019 00:49    EKG: Independently reviewed.  Vent. rate 108 BPM PR interval * ms QRS duration 84 ms QT/QTc 345/403 ms P-R-T axes 82 71 85 Sinus tachycardia Ventricular bigeminy Baseline wander in lead(s) V4 PVCs more frequent  Assessment/Plan Principal Problem:   Pneumonia due to COVID-19 virus Admit to telemetry/inpatient. Continue supplemental oxygen. Continue bronchodilators. Prone positioning if able to. Incentive spirometry. Continue dexamethasone. Continue remdesivir. Actemra x1 dose. Follow-up CBC, CMP and inflammatory markers.  Active Problems:   Acute  kidney injury ([place]) Likely due to prolonged hyperglycemia. Hold lisinopril and furosemide. Continue IV fluids. Monitor daily weights, intake and output.    Essential hypertension Resume carvedilol if not bradycardic. Hold lisinopril and furosemide.    Acute encephalopathy Secondary to dehydration and acute illness. Oriented to name, knows he is in the hospital.    Hyperosmolar non-ketotic state due to type 2 diabetes mellitus ([place]) Resolving. Blood glucose much better. Continue IV fluids. Continue SQ insulin.    DVT prophylaxis: Lovenox SQ. Code Status: Full code. Family Communication: Disposition Plan: Admit for COVID-19 pneumonia treatment. Consults called: Admission status: Inpatient/telemetry.   Reubin Milan MD [facility]  If 7PM-7AM, please contact night-coverage [email or web address]  03/23/2019, 10:50 PM   This document was prepared using Dragon voice recognition software and may contain some unintended transcription errors.

## 2019-03-23 NOTE — ED Notes (Signed)
CBG checked, 142, notified RN

## 2019-03-24 ENCOUNTER — Other Ambulatory Visit: Payer: Self-pay

## 2019-03-24 ENCOUNTER — Inpatient Hospital Stay: Payer: Self-pay

## 2019-03-24 DIAGNOSIS — L8990 Pressure ulcer of unspecified site, unspecified stage: Secondary | ICD-10-CM | POA: Insufficient documentation

## 2019-03-24 LAB — COMPREHENSIVE METABOLIC PANEL
ALT: 14 U/L (ref 0–44)
AST: 24 U/L (ref 15–41)
Albumin: 2.3 g/dL — ABNORMAL LOW (ref 3.5–5.0)
Alkaline Phosphatase: 34 U/L — ABNORMAL LOW (ref 38–126)
Anion gap: 16 — ABNORMAL HIGH (ref 5–15)
BUN: 117 mg/dL — ABNORMAL HIGH (ref 8–23)
CO2: 20 mmol/L — ABNORMAL LOW (ref 22–32)
Calcium: 8.1 mg/dL — ABNORMAL LOW (ref 8.9–10.3)
Chloride: 107 mmol/L (ref 98–111)
Creatinine, Ser: 5.36 mg/dL — ABNORMAL HIGH (ref 0.61–1.24)
GFR calc Af Amer: 10 mL/min — ABNORMAL LOW (ref >60)
GFR calc non Af Amer: 9 mL/min — ABNORMAL LOW (ref >60)
Glucose, Bld: 172 mg/dL — ABNORMAL HIGH (ref 70–99)
Potassium: 4 mmol/L (ref 3.5–5.1)
Sodium: 143 mmol/L (ref 135–145)
Total Bilirubin: 0.7 mg/dL (ref 0.3–1.2)
Total Protein: 6.4 g/dL — ABNORMAL LOW (ref 6.5–8.1)

## 2019-03-24 LAB — GLUCOSE, CAPILLARY
Glucose-Capillary: 158 mg/dL — ABNORMAL HIGH (ref 70–99)
Glucose-Capillary: 137 mg/dL — ABNORMAL HIGH (ref 70–99)
Glucose-Capillary: 151 mg/dL — ABNORMAL HIGH (ref 70–99)
Glucose-Capillary: 192 mg/dL — ABNORMAL HIGH (ref 70–99)
Glucose-Capillary: 246 mg/dL — ABNORMAL HIGH (ref 70–99)
Glucose-Capillary: 337 mg/dL — ABNORMAL HIGH (ref 70–99)
Glucose-Capillary: 283 mg/dL — ABNORMAL HIGH (ref 70–99)
Glucose-Capillary: 216 mg/dL — ABNORMAL HIGH (ref 70–99)

## 2019-03-24 LAB — MAGNESIUM: Magnesium: 2.2 mg/dL (ref 1.7–2.4)

## 2019-03-24 LAB — CBC WITH DIFFERENTIAL/PLATELET
Abs Immature Granulocytes: 0.06 10*3/uL (ref 0.00–0.07)
Basophils Absolute: 0 10*3/uL (ref 0.0–0.1)
Basophils Relative: 0 %
Eosinophils Absolute: 0 10*3/uL (ref 0.0–0.5)
Eosinophils Relative: 0 %
HCT: 30.9 % — ABNORMAL LOW (ref 39.0–52.0)
Hemoglobin: 9.9 g/dL — ABNORMAL LOW (ref 13.0–17.0)
Immature Granulocytes: 1 %
Lymphocytes Relative: 8 %
Lymphs Abs: 0.6 10*3/uL — ABNORMAL LOW (ref 0.7–4.0)
MCH: 28 pg (ref 26.0–34.0)
MCHC: 32 g/dL (ref 30.0–36.0)
MCV: 87.5 fL (ref 80.0–100.0)
Monocytes Absolute: 0.4 10*3/uL (ref 0.1–1.0)
Monocytes Relative: 6 %
Neutro Abs: 6 10*3/uL (ref 1.7–7.7)
Neutrophils Relative %: 85 %
Platelets: 219 10*3/uL (ref 150–400)
RBC: 3.53 MIL/uL — ABNORMAL LOW (ref 4.22–5.81)
RDW: 13.6 % (ref 11.5–15.5)
WBC: 7 10*3/uL (ref 4.0–10.5)
nRBC: 0 % (ref 0.0–0.2)

## 2019-03-24 LAB — URINE CULTURE: Culture: NO GROWTH

## 2019-03-24 LAB — D-DIMER, QUANTITATIVE
D-Dimer, Quant: 6.83 ug/mL-FEU — ABNORMAL HIGH (ref 0.00–0.50)
D-Dimer, Quant: 6.31 ug/mL-FEU — ABNORMAL HIGH (ref 0.00–0.50)

## 2019-03-24 LAB — BASIC METABOLIC PANEL
Anion gap: 15 (ref 5–15)
BUN: 116 mg/dL — ABNORMAL HIGH (ref 8–23)
CO2: 21 mmol/L — ABNORMAL LOW (ref 22–32)
Calcium: 8.3 mg/dL — ABNORMAL LOW (ref 8.9–10.3)
Chloride: 110 mmol/L (ref 98–111)
Creatinine, Ser: 5.17 mg/dL — ABNORMAL HIGH (ref 0.61–1.24)
GFR calc Af Amer: 11 mL/min — ABNORMAL LOW (ref >60)
GFR calc non Af Amer: 9 mL/min — ABNORMAL LOW (ref >60)
Glucose, Bld: 258 mg/dL — ABNORMAL HIGH (ref 70–99)
Potassium: 4.2 mmol/L (ref 3.5–5.1)
Sodium: 146 mmol/L — ABNORMAL HIGH (ref 135–145)

## 2019-03-24 LAB — FERRITIN
Ferritin: 787 ng/mL — ABNORMAL HIGH (ref 24–336)
Ferritin: 842 ng/mL — ABNORMAL HIGH (ref 24–336)

## 2019-03-24 LAB — BRAIN NATRIURETIC PEPTIDE: B Natriuretic Peptide: 68.8 pg/mL (ref 0.0–100.0)

## 2019-03-24 LAB — FIBRINOGEN: Fibrinogen: 587 mg/dL — ABNORMAL HIGH (ref 210–475)

## 2019-03-24 LAB — PROCALCITONIN: Procalcitonin: 0.99 ng/mL

## 2019-03-24 LAB — PHOSPHORUS: Phosphorus: 4.4 mg/dL (ref 2.5–4.6)

## 2019-03-24 LAB — C-REACTIVE PROTEIN
CRP: 18.5 mg/dL — ABNORMAL HIGH (ref <1.0)
CRP: 15.7 mg/dL — ABNORMAL HIGH (ref <1.0)

## 2019-03-24 LAB — BETA-HYDROXYBUTYRIC ACID: Beta-Hydroxybutyric Acid: 0.2 mmol/L (ref 0.05–0.27)

## 2019-03-24 MED ORDER — SODIUM CHLORIDE 0.9 % IV SOLN: INTRAVENOUS | Status: DC

## 2019-03-24 MED ORDER — INSULIN REGULAR(HUMAN) IN NACL 100-0.9 UT/100ML-% IV SOLN
INTRAVENOUS | Status: DC
  Administered 2019-03-24: 6 [IU]/h via INTRAVENOUS
  Administered 2019-03-24: 14 [IU]/h via INTRAVENOUS

## 2019-03-24 MED ORDER — DEXTROSE-NACL 5-0.45 % IV SOLN: INTRAVENOUS | Status: DC

## 2019-03-24 MED ORDER — SODIUM CHLORIDE 0.9% FLUSH
10.0000 mL | Freq: Two times a day (BID) | INTRAVENOUS | Status: DC
  Administered 2019-03-24 – 2019-03-25 (×3): 10 mL

## 2019-03-24 MED ORDER — TOCILIZUMAB 400 MG/20ML IV SOLN
760.0000 mg | Freq: Once | INTRAVENOUS | Status: DC
  Filled 2019-03-24: qty 38

## 2019-03-24 MED ORDER — SODIUM CHLORIDE 0.9% FLUSH: 10.0000 mL | INTRAVENOUS | Status: DC | PRN

## 2019-03-24 MED ORDER — INSULIN GLARGINE 100 UNIT/ML ~~LOC~~ SOLN: 30.0000 [IU] | Freq: Every day | SUBCUTANEOUS | Status: DC

## 2019-03-24 MED ORDER — DEXTROSE 5 % IV SOLN: INTRAVENOUS | Status: DC

## 2019-03-24 MED ORDER — INSULIN DETEMIR 100 UNIT/ML ~~LOC~~ SOLN
30.0000 [IU] | Freq: Two times a day (BID) | SUBCUTANEOUS | Status: DC
  Filled 2019-03-24: qty 0.3

## 2019-03-24 MED ORDER — SODIUM CHLORIDE 0.45 % IV SOLN: INTRAVENOUS | Status: DC

## 2019-03-24 MED ORDER — HEPARIN SODIUM (PORCINE) 10000 UNIT/ML IJ SOLN
10000.0000 [IU] | Freq: Three times a day (TID) | INTRAMUSCULAR | Status: DC
  Administered 2019-03-24 – 2019-03-27 (×8): 10000 [IU] via SUBCUTANEOUS
  Filled 2019-03-24 (×8): qty 1

## 2019-03-24 MED ORDER — DEXTROSE 50 % IV SOLN: 0.0000 mL | INTRAVENOUS | Status: DC | PRN

## 2019-03-24 MED ORDER — HALOPERIDOL LACTATE 5 MG/ML IJ SOLN: 2.0000 mg | Freq: Four times a day (QID) | INTRAMUSCULAR | Status: DC | PRN

## 2019-03-24 MED ORDER — INSULIN DETEMIR 100 UNIT/ML ~~LOC~~ SOLN
20.0000 [IU] | Freq: Two times a day (BID) | SUBCUTANEOUS | Status: DC
  Filled 2019-03-24: qty 0.2

## 2019-03-24 NOTE — Progress Notes (Signed)
Peripherally Inserted Central Catheter/Midline Placement  The IV Nurse has discussed with the patient and/or persons authorized to consent for the patient, the purpose of this procedure and the potential benefits and risks involved with this procedure.  The benefits include less needle sticks, lab draws from the catheter, and the patient may be discharged home with the catheter. Risks include, but not limited to, infection, bleeding, blood clot (thrombus formation), and puncture of an artery; nerve damage and irregular heartbeat and possibility to perform a PICC exchange if needed/ordered by physician.  Alternatives to this procedure were also discussed.  Bard Power PICC patient education guide, fact sheet on infection prevention and patient information card has been provided to patient /or left at bedside.  Phone consent obtain from daughteer  PICC/Midline Placement Documentation  PICC Double Lumen 03/24/19 PICC Right Brachial 43 cm 0 cm (Active)  Indication for Insertion or Continuance of Line Prolonged intravenous therapies 03/24/19 1813  Exposed Catheter (cm) 0 cm 03/24/19 1813  Site Assessment Clean;Intact;Dry 03/24/19 1813  Lumen #1 Status Flushed;Saline locked;Blood return noted 03/24/19 1813  Lumen #2 Status Flushed;Saline locked;Blood return noted 03/24/19 1813  Dressing Type Transparent 03/24/19 1813  Dressing Status Clean;Dry;Intact 03/24/19 1813  Dressing Change Due 03/31/19 03/24/19 1813       Murl, [place] 03/24/2019, 6:14 PM

## 2019-03-24 NOTE — Progress Notes (Signed)
Spoke to [place], daughter, today x2 to update pt condition. Will restart IV insulin drip after placement of PICC. Very agitated early afternoon, removing all 3 PIV's, ekg's, etc. Incontinent of large soft, brown stool on bed and floor. Endo tool initiated but stopped after PIV lose. Currently refusing to eat. Bradycardia and wide complex pvc's noted earlier, asymptomatic. 12 lead ekg pending.

## 2019-03-24 NOTE — Progress Notes (Signed)
Spoke with Stanton Kidney RN re PICC order.  RN to clarify the order re midline vs PICC.  Also re need for restraints due to pulling out IV access and ability to also pull out a PICC.  Will check back with RN.

## 2019-03-24 NOTE — Progress Notes (Addendum)
PROGRESS NOTE                                                                                                                                                                                                             Patient Demographics:    Marcus Weaver, is a 86 y.o. male, DOB - 08/30/1930, JOI:[id number]  Outpatient Primary MD for the patient is [name], MD    LOS - 2  Admit date - 03/22/2019    Chief Complaint  Patient presents with  . Hyperglycemia       Brief Narrative - Marcus Weaver is a 86 y.o. male with medical history significant of abnormal EKG, CKD, type 2 diabetes, hyperlipidemia, hypertension, hypertensive heart disease, tachycardia, history of UTI, dementia who was transferred from [facility] after presenting there with a history of hyperglycemia over 500 mg/dL for a week according to the patient's daughter.  He was found to be in DKA along with COVID-19 pneumonia and sent to [facility].   Subjective:    Marcus Weaver today has, No headache, No chest pain, No abdominal pain - No Nausea, No new weakness tingling or numbness, No Cough, No SOB.     Assessment  & Plan :     1. Acute Covid 19 Viral Pneumonitis during the ongoing 2020 Covid 19 Pandemic - he so far seems to have mild pulmonary disease, on IV steroids and remdesivir, currently not hypoxic will monitor closely.  He is actually currently on room air and not on 1 L.  Encouraged the patient to sit up in chair in the daytime use I-S and flutter valve for pulmonary toiletry and then prone in bed when at night.  SpO2: 96 % O2 Flow Rate (L/min): 1 L/min  Recent Labs  Lab 03/22/19 2238 03/23/19 2255 03/24/19 0621  CRP  --  18.5* 15.7*  DDIMER  --  6.83* 6.31*  FERRITIN  --  787* 842*  BNP  --  68.8  --   PROCALCITON  --  0.99  --   SARSCOV2NAA POSITIVE*  --   --     Hepatic Function Latest Ref Rng & Units 03/24/2019 03/22/2019 07/31/2018  Total  Protein 6.5 - 8.1 g/dL 6.4(L) 7.5 7.2  Albumin 3.5 - 5.0 g/dL 2.3(L) 2.9(L) 3.9  AST 15 - 41 U/L _0 ALT  0 - 44 U/L _0 Alk Phosphatase 38 - 126 U/L 34(L) 41 62  Total Bilirubin 0.3 - 1.2 mg/dL 0.7 0.5 0.4  Bilirubin, Direct 0.0 - 0.3 mg/dL - - 0.1    2.  DKA with wide anion gap metabolic acidosis in a patient with DM type II on insulin.  His gap has opened up again, will continue Levemir twice daily along with insulin drip per DKA protocol, D5 drip to keep his sugars above 100, goal will be to keep insulin running until gap closes.  Continue carb modified diet, monitor closely.  3.  AKI on 5.  Baseline creatinine close to 3.5, continue hydration, hold nephrotoxins, hold diuretics and ACE.  Check bladder scan and renal ultrasound and monitor.  This is likely due to prerenal ATN.  4.  Essential hypertension.  Coreg, hold lisinopril and Lasix.  5.  Metabolic encephalopathy.  CT head negative, no focal deficits, was better this a.m. however during the afternoon again more delirious.  Restraints to prevent from pulling multiple lines which she already has, PICC line for now, supportive care, as needed Haldol ordered.      Condition -   Guarded  Family Communication  :  Daughter Bee -on 03/24/2019 at 10:40 AM  Code Status : DNR  Diet :   Diet Order            Diet Carb Modified Fluid consistency: Thin; Room service appropriate? Yes  Diet effective now               Disposition Plan  : To be decide  Consults  :  None  Procedures  : CT head.  Nonacute  PUD Prophylaxis :    DVT Prophylaxis  : Heparin   Lab Results  Component Value Date   PLT 219 03/24/2019    Inpatient Medications  Scheduled Meds: . vitamin C  500 mg Oral Daily  . Chlorhexidine Gluconate Cloth  6 each Topical Daily  . dexamethasone (DECADRON) injection  6 mg Intravenous Q24H  . feeding supplement (NEPRO CARB STEADY)  237 mL Oral BID BM  . heparin injection (subcutaneous)  10,000 Units  Subcutaneous Q8H  . insulin detemir  30 Units Subcutaneous BID  . Ipratropium-Albuterol  2 puff Inhalation Q6H  . zinc sulfate  220 mg Oral Daily   Continuous Infusions: . dextrose 5 % and 0.45% NaCl    . insulin    . remdesivir 100 mg in NS 100 mL 100 mg (03/24/19 1024)   PRN Meds:.acetaminophen **OR** [DISCONTINUED] acetaminophen, chlorpheniramine-HYDROcodone, dextrose, guaiFENesin-dextromethorphan, [DISCONTINUED] ondansetron **OR** ondansetron (ZOFRAN) IV  Antibiotics  :    Anti-infectives (From admission, onward)   Start     Dose/Rate Route Frequency Ordered Stop   03/24/19 1000  remdesivir 100 mg in sodium chloride 0.9 % 100 mL IVPB     100 mg 200 mL/hr over 30 Minutes Intravenous Daily 03/23/19 0522 03/28/19 0959   03/23/19 0600  remdesivir 200 mg in sodium chloride 0.9% 250 mL IVPB     200 mg 580 mL/hr over 30 Minutes Intravenous Once 03/23/19 0522 03/23/19 1235   03/22/19 2230  cefTRIAXone (ROCEPHIN) 1 g in sodium chloride 0.9 % 100 mL IVPB     1 g 200 mL/hr over 30 Minutes Intravenous  Once 03/22/19 2218 03/22/19 2301       Time Spent in minutes  30   Lala Lund M.D on 03/24/2019 at 10:39 AM  To page go to [email or web address] -  password [email or web address]  [phone]  See all Orders from today for further details    Objective:   Vitals:   03/24/19 0400 03/24/19 0415 03/24/19 0800 03/24/19 0955  BP:  (!) 142/69 (!) 153/73 (!) 150/72  Pulse:  60 64 68  Resp:  18 16   Temp: 97.9 F (36.6 C)  97.9 F (36.6 C)   TempSrc:   Oral   SpO2:  95% 91% 96%  Weight:      Height:        Wt Readings from Last 3 Encounters:  03/23/19 95.2 kg  03/19/19 95.7 kg  02/02/19 95.8 kg     Intake/Output Summary (Last 24 hours) at 03/24/2019 1039 Last data filed at 03/24/2019 0400 Gross per 24 hour  Intake 1671.41 ml  Output 100 ml  Net 1571.41 ml     Physical Exam  Awake Alert,   No new F.N deficits,  R eye big cataract [place].AT,  Supple Neck,No JVD,  No cervical lymphadenopathy appriciated.  Symmetrical Chest wall movement, Good air movement bilaterally, CTAB RRR,No Gallops,Rubs or new Murmurs, No Parasternal Heave +ve B.Sounds, Abd Soft, No tenderness, No organomegaly appriciated, No rebound - guarding or rigidity. No Cyanosis,      Data Review:    CBC Recent Labs  Lab 03/19/19 1441 03/22/19 2011 03/22/19 2145 03/23/19 2255 03/24/19 0621  WBC 5.9 7.0  --  6.0 7.0  HGB 11.7* 11.2* 11.2* 10.5* 9.9*  HCT 35.6* 34.5* 33.0* 32.3* 30.9*  PLT 183 194  --  210 219  MCV 88.6 88.5  --  88.7 87.5  MCH 29.1 28.7  --  28.8 28.0  MCHC 32.9 32.5  --  32.5 32.0  RDW 12.8 13.3  --  13.5 13.6  LYMPHSABS  --  0.8  --  0.5* 0.6*  MONOABS  --  0.5  --  0.2 0.4  EOSABS  --  0.0  --  0.0 0.0  BASOSABS  --  0.0  --  0.0 0.0    Chemistries  Recent Labs  Lab 03/19/19 1441 03/22/19 2011 03/22/19 2145 03/23/19 0604 03/24/19 0621  NA 137 138 135 142 143  K 4.0 4.5 6.8* 3.9 4.0  CL 101 102  --  109 107  CO2 23 21*  --  23 20*  GLUCOSE 366* 505*  --  130* 172*  BUN 64* 112*  --  112* 117*  CREATININE 4.14* 5.78*  --  5.90* 5.36*  CALCIUM 8.5* 8.4*  --  8.3* 8.1*  MG  --   --   --   --  2.2  AST  --  31  --   --  24  ALT  --  17  --   --  14  ALKPHOS  --  41  --   --  34*  BILITOT  --  0.5  --   --  0.7   ------------------------------------------------------------------------------------------------------------------ No results for input(s): CHOL, HDL, LDLCALC, TRIG, CHOLHDL, LDLDIRECT in the last 72 hours.  Lab Results  Component Value Date   HGBA1C 9.2 (A) 02/02/2019   ------------------------------------------------------------------------------------------------------------------ No results for input(s): TSH, T4TOTAL, T3FREE, THYROIDAB in the last 72 hours.  Invalid input(s): FREET3  Cardiac Enzymes No results for input(s): CKMB, TROPONINI, MYOGLOBIN in the last 168 hours.  Invalid input(s):  CK ------------------------------------------------------------------------------------------------------------------    Component Value Date/Time   BNP 68.8 03/23/2019 2255    Micro Results Recent Results (from the past 240 hour(s))  Urine culture     Status: None   Collection Time: 03/22/19  9:30 PM   Specimen: Urine, Catheterized  Result Value Ref Range Status   Specimen Description   Final    URINE, CATHETERIZED Performed at [facility], [address] [place], [place] [address]    Special Requests   Final    NONE Performed at [facility], [address] [place], [place] [address]    Culture   Final    NO GROWTH Performed at [facility], [place] [address], [place], [place] [address]    Report Status 03/24/2019 FINAL  Final  SARS CORONAVIRUS 2 (TAT 6-24 HRS) Nasopharyngeal Nasopharyngeal Swab     Status: Abnormal   Collection Time: 03/22/19 10:38 PM   Specimen: Nasopharyngeal Swab  Result Value Ref Range Status   SARS Coronavirus 2 POSITIVE (A) NEGATIVE Final    Comment: RESULT CALLED TO, READ BACK BY AND VERIFIED WITH: S.GOUGE,RN [id number] I.MANNING (NOTE) SARS-CoV-2 target nucleic acids are DETECTED. The SARS-CoV-2 RNA is generally detectable in upper and lower respiratory specimens during the acute phase of infection. Positive results are indicative of the presence of SARS-CoV-2 RNA. Clinical correlation with patient history and other diagnostic information is  necessary to determine patient infection status. Positive results do not rule out bacterial infection or co-infection with other viruses.  The expected result is Negative. Fact Sheet for Patients: SugarRoll.be Fact Sheet for Healthcare Providers: https://www.woods-mathews.com/ This test is not yet approved or cleared by the [place] FDA and  has been authorized for detection and/or diagnosis of SARS-CoV-2 by FDA under an  Emergency Use Authorization (EUA). This EUA will remain  in effect (meaning this test can be used) for the  duration of the COVID-19 declaration under Section 564(b)(1) of the Act, 21 U.S.C. section 360bbb-3(b)(1), unless the authorization is terminated or revoked sooner. Performed at [facility], [place] [address] [place], [place] [address]   SARS Coronavirus 2 Ag (30 min TAT) - Nasal Swab (BD Veritor Kit)     Status: Abnormal   Collection Time: 03/22/19 11:39 PM   Specimen: Nasal Swab (BD Veritor Kit)  Result Value Ref Range Status   SARS Coronavirus 2 Ag POSITIVE (A) NEGATIVE Final    Comment: RESULT CALLED TO, READ BACK BY AND VERIFIED WITH: MAYNARD,C AT 0007 ON [id number] BY CHERESNOWSKY,T (NOTE) SARS-CoV-2 antigen PRESENT. Positive results indicate the presence of viral antigens, but clinical correlation with patient history and other diagnostic information is necessary to determine patient infection status.  Positive results do not rule out bacterial infection or co-infection  with other viruses. False positive results are rare but can occur, and confirmatory RT-PCR testing may be appropriate in some circumstances. The expected result is Negative. Fact Sheet for Patients: PodPark.tn Fact Sheet for Providers: GiftContent.is  This test is not yet approved or cleared by the [place] FDA and  has been authorized for detection and/or diagnosis of SARS-CoV-2 by FDA under an Emergency Use Authorization (EUA).  This EUA will remain in effect (meaning this test can be used) for the duration of  the COVID- 19 declaration under Section 564(b)(1) of the Act, 21 U.S.C. section 360bbb-3(b)(1), unless the authorization is terminated or revoked sooner. Performed at [facility], [address] [place], [place] [address]     Radiology Reports CT Head Wo Contrast  Result Date: 03/22/2019 CLINICAL DATA:  Altered  mental status EXAM: CT HEAD  WITHOUT CONTRAST TECHNIQUE: Contiguous axial images were obtained from the base of the skull through the vertex without intravenous contrast. COMPARISON:  CT brain 05/15/2015 FINDINGS: Brain: No acute territorial infarction, hemorrhage, or intracranial mass is visualized. Moderate atrophy, slightly progressed since 2017. Moderate hypodensity in the white matter, likely chronic small vessel ischemic change. Moderate enlargement of the ventricles, slightly progressed since 2017. Vascular: No hyperdense vessels.  Carotid vascular calcification Skull: Normal. Negative for fracture or focal lesion. Sinuses/Orbits: Mild mucosal thickening in the sinuses. Old fractures of the medial walls of both orbits Other: None IMPRESSION: 1. No definite CT evidence for acute intracranial abnormality. 2. Moderate ventricular enlargement, slightly increased since 2017, likely related to progression of atrophy. 3. Moderate hypodensity in the white matter consistent with chronic small vessel ischemic change Electronically Signed   By: Donavan Foil M.D.   On: 03/22/2019 19:48   DG Chest Port 1 View  Result Date: 03/23/2019 CLINICAL DATA:  Worsening dyspnea.  COVID-19 positive. EXAM: PORTABLE CHEST 1 VIEW COMPARISON:  Radiograph 3 hours ago. FINDINGS: Unchanged heart size and mediastinal contours. Aortic atherosclerosis. Minimal vague airspace opacity in the mid lower right lung zone. Mild central bronchial thickening. No pulmonary edema, pleural effusion or pneumothorax. Stable osseous structures. IMPRESSION: 1. Developing opacities in the right mid lower lung zone, likely related to COVID-19 pneumonia. 2. Central bronchial thickening. 3.  Aortic Atherosclerosis (ICD10-I70.0). Electronically Signed   By: Keith Rake M.D.   On: 03/23/2019 04:42   DG Chest Portable 1 View  Result Date: 03/23/2019 CLINICAL DATA:  Initial evaluation for acute altered mental status. EXAM: PORTABLE CHEST 1 VIEW COMPARISON:   Prior radiograph from 07/29/2009. FINDINGS: The cardiac and mediastinal silhouettes are stable in size and contour, and remain within normal limits. Aortic atherosclerosis. The lungs are normally inflated. No airspace consolidation, pleural effusion, or pulmonary edema. No pneumothorax. No acute osseous abnormality. IMPRESSION: 1. No radiographic evidence for active cardiopulmonary disease. 2.  Aortic Atherosclerosis (ICD10-I70.0). Electronically Signed   By: Jeannine Boga M.D.   On: 03/23/2019 00:49

## 2019-03-24 NOTE — Evaluation (Signed)
Physical Therapy Evaluation Patient Details Name: Marcus Weaver MRN: [id number] DOB: [birth year] Today's Date: 03/24/2019   History of Present Illness  86 year old male , previously in [facility], admitted with high BS, AMS, Type 2 DM, HTN, hyperlipidemia, tachycardia, and UTI. Pneumonia due to Covid.  Clinical Impression  Patient presents with generalized weakness throughout CORE and LEs. Unable to confirm details of PLOF, and states that he uses a RW for household ambulation. He lives with a daughter who works during the day and provides all driving for groceries and appts. He does wear bifocals- RIGHT eye is very cloudy- but he states he was hit by a baseball "many years ago". He states he lives in a one level home with his daughter with 8 outside steps-but prior history indicates there is a ramp. He is confused. Should benefit from PT for optimal functinal outcomes as he was a Max of 1-2 for safety in bed mobility and unable to sit erect on edge of bed.    Follow Up Recommendations SNF    Equipment Recommendations    Reported has RW and cane at home as well as shower seat   Recommendations for Other Services       Precautions / Restrictions Precautions Precautions: Fall Restrictions Weight Bearing Restrictions: No      Mobility  Bed Mobility Overal bed mobility: Needs Assistance Bed Mobility: Rolling;Sidelying to Sit;Supine to Sit;Sit to Supine;Sit to Sidelying Rolling: Max assist Sidelying to sit: +2 for physical assistance Supine to sit: +2 for physical assistance Sit to supine: +2 for physical assistance Sit to sidelying: +2 for physical assistance General bed mobility comments: He is generally weak throughout CORE and LEs  Transfers                 General transfer comment: He was unable to perform fully erect sitting on edge of bed, and further could not attempt sit<>stand  Ambulation/Gait Ambulation/Gait assistance: (He was unable to perform sit<>stand for pre gait,  could not sit erect on edge of bed.)   Assistive device: (Has RW in room, but unable to use this session.)          Stairs            Wheelchair Mobility    Modified Rankin (Stroke Patients Only)       Balance                                             Pertinent Vitals/Pain Pain Assessment: 0-10 Pain Score: 5  Pain Location: Low back and buttocks Pain Descriptors / Indicators: Sore;Tender;Pressure Pain Intervention(s): Repositioned    Home Living Family/patient expects to be discharged to:: Private residence Living Arrangements: Children Available Help at Discharge: Available 24 hours/day Type of Home: House Home Access: Stairs to enter Entrance Stairs-Rails: Right Entrance Stairs-Number of Steps: 8- unable to confirm the details of PLOF Home Layout: One level Home Equipment: Walker - 2 wheels Additional Comments: States that he lives with a daughter who works during the day- and provides all driving for groceries and appts. According to patient he has two RW- FWW- exactly the same. No other equipment    Prior Function Level of Independence: Independent with assistive device(s)         Comments: According to the patient, he uses a RW.     Hand Dominance   Dominant Hand:  Right    Extremity/Trunk Assessment   Upper Extremity Assessment Upper Extremity Assessment: Defer to OT evaluation    Lower Extremity Assessment Lower Extremity Assessment: Defer to PT evaluation;Generalized weakness    Cervical / Trunk Assessment Cervical / Trunk Assessment: Kyphotic  Communication   Communication: Other (comment)(He presents as confused with little insight into his medical situation)  Cognition Arousal/Alertness: Awake/alert Behavior During Therapy: (Pleasant but confused) Overall Cognitive Status: History of cognitive impairments - at baseline                                 General Comments: He kept asking why he was in  the hospital and why his daughter left him here- "has not been back to see him"      General Comments      Exercises General Exercises - Lower Extremity Ankle Circles/Pumps: AROM Quad Sets: AROM Short Arc Quad: AROM Long Arc Quad: AROM Hip ABduction/ADduction: AROM Straight Leg Raises: AROM   Assessment/Plan    PT Assessment Patient needs continued PT services  PT Problem List Decreased strength;Decreased activity tolerance;Pain;Decreased mobility       PT Treatment Interventions Therapeutic activities;Therapeutic exercise    PT Goals (Current goals can be found in the Care Plan section)  Acute Rehab PT Goals Patient Stated Goal: Just want to go home as I do not feel bad. PT Goal Formulation: With patient Time For Goal Achievement: 04/07/19    Frequency Min 3X/week   Barriers to discharge        Co-evaluation               AM-PAC PT "6 Clicks" Mobility  Outcome Measure Help needed turning from your back to your side while in a flat bed without using bedrails?: A Lot Help needed moving from lying on your back to sitting on the side of a flat bed without using bedrails?: A Lot Help needed moving to and from a bed to a chair (including a wheelchair)?: A Lot Help needed standing up from a chair using your arms (e.g., wheelchair or bedside chair)?: A Lot Help needed to walk in hospital room?: A Lot Help needed climbing 3-5 steps with a railing? : A Lot 6 Click Score: 12    End of Session   Activity Tolerance: Patient limited by fatigue;Patient limited by pain(Limited by generalized weakness and complaints of pain in low back and buttocks)     PT Visit Diagnosis: Muscle weakness (generalized) (M62.81)    Time: 0920-1010 PT Time Calculation (min) (ACUTE ONLY): 50 min   Charges:   PT Evaluation $PT Eval Moderate Complexity: 1 Mod PT Treatments $Therapeutic Exercise: 8-22 mins $Therapeutic Activity: 8-22 mins        Rollen Sox, PT # [phone] CGV  cell   Casandra Doffing 03/24/2019, 10:13 AM

## 2019-03-25 ENCOUNTER — Inpatient Hospital Stay (HOSPITAL_COMMUNITY): Payer: Medicare Other

## 2019-03-25 LAB — GLUCOSE, CAPILLARY
Glucose-Capillary: 106 mg/dL — ABNORMAL HIGH (ref 70–99)
Glucose-Capillary: 132 mg/dL — ABNORMAL HIGH (ref 70–99)
Glucose-Capillary: 141 mg/dL — ABNORMAL HIGH (ref 70–99)
Glucose-Capillary: 146 mg/dL — ABNORMAL HIGH (ref 70–99)
Glucose-Capillary: 163 mg/dL — ABNORMAL HIGH (ref 70–99)
Glucose-Capillary: 174 mg/dL — ABNORMAL HIGH (ref 70–99)
Glucose-Capillary: 180 mg/dL — ABNORMAL HIGH (ref 70–99)
Glucose-Capillary: 219 mg/dL — ABNORMAL HIGH (ref 70–99)
Glucose-Capillary: 235 mg/dL — ABNORMAL HIGH (ref 70–99)
Glucose-Capillary: 356 mg/dL — ABNORMAL HIGH (ref 70–99)
Glucose-Capillary: 91 mg/dL (ref 70–99)

## 2019-03-25 LAB — BETA-HYDROXYBUTYRIC ACID
Beta-Hydroxybutyric Acid: 0.14 mmol/L (ref 0.05–0.27)
Beta-Hydroxybutyric Acid: 0.08 mmol/L (ref 0.05–0.27)

## 2019-03-25 LAB — CBC WITH DIFFERENTIAL/PLATELET
Abs Immature Granulocytes: 0.07 10*3/uL (ref 0.00–0.07)
Basophils Absolute: 0 10*3/uL (ref 0.0–0.1)
Basophils Relative: 0 %
Eosinophils Absolute: 0 10*3/uL (ref 0.0–0.5)
Eosinophils Relative: 0 %
HCT: 31 % — ABNORMAL LOW (ref 39.0–52.0)
Hemoglobin: 10 g/dL — ABNORMAL LOW (ref 13.0–17.0)
Immature Granulocytes: 1 %
Lymphocytes Relative: 5 %
Lymphs Abs: 0.5 10*3/uL — ABNORMAL LOW (ref 0.7–4.0)
MCH: 28.5 pg (ref 26.0–34.0)
MCHC: 32.3 g/dL (ref 30.0–36.0)
MCV: 88.3 fL (ref 80.0–100.0)
Monocytes Absolute: 0.5 10*3/uL (ref 0.1–1.0)
Monocytes Relative: 5 %
Neutro Abs: 9 10*3/uL — ABNORMAL HIGH (ref 1.7–7.7)
Neutrophils Relative %: 89 %
Platelets: 246 10*3/uL (ref 150–400)
RBC: 3.51 MIL/uL — ABNORMAL LOW (ref 4.22–5.81)
RDW: 13.8 % (ref 11.5–15.5)
WBC: 10.1 10*3/uL (ref 4.0–10.5)
nRBC: 0 % (ref 0.0–0.2)

## 2019-03-25 LAB — COMPREHENSIVE METABOLIC PANEL
ALT: 14 U/L (ref 0–44)
AST: 23 U/L (ref 15–41)
Albumin: 2.1 g/dL — ABNORMAL LOW (ref 3.5–5.0)
Alkaline Phosphatase: 38 U/L (ref 38–126)
Anion gap: 15 (ref 5–15)
BUN: 125 mg/dL — ABNORMAL HIGH (ref 8–23)
CO2: 21 mmol/L — ABNORMAL LOW (ref 22–32)
Calcium: 8.1 mg/dL — ABNORMAL LOW (ref 8.9–10.3)
Chloride: 111 mmol/L (ref 98–111)
Creatinine, Ser: 4.52 mg/dL — ABNORMAL HIGH (ref 0.61–1.24)
GFR calc Af Amer: 13 mL/min — ABNORMAL LOW (ref >60)
GFR calc non Af Amer: 11 mL/min — ABNORMAL LOW (ref >60)
Glucose, Bld: 100 mg/dL — ABNORMAL HIGH (ref 70–99)
Potassium: 3.7 mmol/L (ref 3.5–5.1)
Sodium: 147 mmol/L — ABNORMAL HIGH (ref 135–145)
Total Bilirubin: 0.5 mg/dL (ref 0.3–1.2)
Total Protein: 5.9 g/dL — ABNORMAL LOW (ref 6.5–8.1)

## 2019-03-25 LAB — HEMOGLOBIN A1C
Hgb A1c MFr Bld: 10.9 % — ABNORMAL HIGH (ref 4.8–5.6)
Mean Plasma Glucose: 266.13 mg/dL

## 2019-03-25 LAB — BRAIN NATRIURETIC PEPTIDE: B Natriuretic Peptide: 85.2 pg/mL (ref 0.0–100.0)

## 2019-03-25 LAB — MAGNESIUM: Magnesium: 2.2 mg/dL (ref 1.7–2.4)

## 2019-03-25 LAB — C-REACTIVE PROTEIN: CRP: 10.7 mg/dL — ABNORMAL HIGH (ref <1.0)

## 2019-03-25 LAB — D-DIMER, QUANTITATIVE: D-Dimer, Quant: 8.63 ug/mL-FEU — ABNORMAL HIGH (ref 0.00–0.50)

## 2019-03-25 MED ORDER — INSULIN DETEMIR 100 UNIT/ML ~~LOC~~ SOLN
20.0000 [IU] | Freq: Two times a day (BID) | SUBCUTANEOUS | Status: DC
  Administered 2019-03-25 (×2): 20 [IU] via SUBCUTANEOUS
  Filled 2019-03-25 (×3): qty 0.2

## 2019-03-25 MED ORDER — POTASSIUM CHLORIDE IN NACL 20-0.45 MEQ/L-% IV SOLN
INTRAVENOUS | Status: DC
  Filled 2019-03-25 (×2): qty 1000

## 2019-03-25 MED ORDER — INSULIN DETEMIR 100 UNIT/ML ~~LOC~~ SOLN
5.0000 [IU] | Freq: Two times a day (BID) | SUBCUTANEOUS | Status: DC
  Administered 2019-03-25: 06:00:00 5 [IU] via SUBCUTANEOUS
  Filled 2019-03-25: qty 0.05

## 2019-03-25 MED ORDER — DEXAMETHASONE SODIUM PHOSPHATE 4 MG/ML IJ SOLN
2.0000 mg | INTRAMUSCULAR | Status: DC
  Administered 2019-03-26: 06:00:00 2 mg via INTRAVENOUS
  Filled 2019-03-25: qty 1

## 2019-03-25 MED ORDER — AMLODIPINE BESYLATE 10 MG PO TABS
10.0000 mg | ORAL_TABLET | Freq: Every day | ORAL | Status: DC
  Administered 2019-03-25 – 2019-04-01 (×8): 10 mg via ORAL
  Filled 2019-03-25 (×8): qty 1

## 2019-03-25 MED ORDER — LACTATED RINGERS IV SOLN: INTRAVENOUS | Status: DC

## 2019-03-25 MED ORDER — INSULIN ASPART 100 UNIT/ML ~~LOC~~ SOLN: 0.0000 [IU] | Freq: Three times a day (TID) | SUBCUTANEOUS | Status: DC

## 2019-03-25 MED ORDER — INSULIN ASPART 100 UNIT/ML ~~LOC~~ SOLN
0.0000 [IU] | Freq: Three times a day (TID) | SUBCUTANEOUS | Status: DC
  Administered 2019-03-25: 2 [IU] via SUBCUTANEOUS
  Administered 2019-03-25: 13:00:00 3 [IU] via SUBCUTANEOUS
  Administered 2019-03-25: 17:00:00 9 [IU] via SUBCUTANEOUS

## 2019-03-25 NOTE — Progress Notes (Signed)
Patient and nurse spoken to Marcus Weaver, daughter, to update condition of her father who is eating lunch at present. Good appetite for lunch, incontinent  stools 2x per shift. Room air with sat 92-93%. Pt is confused but states he is ready to go home.

## 2019-03-25 NOTE — Progress Notes (Signed)
Inpatient Diabetes Program Recommendations  AACE/ADA: New Consensus Statement on Inpatient Glycemic Control (2015)  Target Ranges:  Prepandial:   less than 140 mg/dL      Peak postprandial:   less than 180 mg/dL (1-2 hours)      Critically ill patients:  140 - 180 mg/dL   Lab Results  Component Value Date   GLUCAP 219 (H) 03/25/2019   HGBA1C 9.2 (A) 02/02/2019    Review of Glycemic Control  Diabetes history: DM2 Outpatient Diabetes medications: Humulin N 60 units insulin Current orders for Inpatient glycemic control: Levemir 20 units bid + Novolog sensitive tid with meals + Decadron 2 mg qd starting in am  Inpatient Diabetes Program Recommendations:   Received consult. Agree with current regimen and will follow during hospitalization.  Thank you, Nani Gasser. Olivea Sonnen, RN, MSN, CDE  Diabetes Coordinator Inpatient Glycemic Control Team Team Pager [phone] (8am-5pm) 03/25/2019 3:35 PM

## 2019-03-25 NOTE — Progress Notes (Addendum)
Initial Nutrition Assessment  RD working remotely.  DOCUMENTATION CODES:   Not applicable  INTERVENTION:  Continue Nepro Shake po BID, each supplement provides 425 kcal and 19 grams protein.  NUTRITION DIAGNOSIS:   Increased nutrient needs related to catabolic [id number]) as evidenced by estimated needs.  GOAL:   Patient will meet greater than or equal to 90% of their needs  MONITOR:   PO intake, Supplement acceptance, Labs, Weight trends, Skin, I & O's  REASON FOR ASSESSMENT:   Malnutrition Screening Tool    ASSESSMENT:   86 year old male with PMHx of dementia, HTN, DM, HLD, CKD admitted with DKA, AKI, metabolic encephalopathy, and was found to have COVID-19 viral pneumonitis.   Patient currently on soft diet. According to RN note he had a good appetite at lunch time today. He ate 10% of breakfast this morning and 75% of lunch today according to chart. Patient has already been ordered for Nepro BID. It appears he drank two bottles yesterday. There is not an indication for Nepro over another ONS but if patient is tolerating them can continue. They are carb-steady.  According to chart patient was 99.8 kg on 01/17/2019. He is now 91.7 kg (202.16 lbs). He has lost 8.1 kg (8.1% body weight) over the past 2 months, which is significant for time frame.  Medications reviewed and include: vitamin C 500 mg daily, Decadron 2 mg IV daily, Nepro BID, Novolog 0-9 units TID, Levemir 20 units BID, zinc sulfate 220 mg daily, 1/2NS with KCl 20 mEq/L at 75 mL/hr, remdesivir.  Labs reviewed: CBG 132-219, Sodium 147, CO2 21, BUN 125, Creatinine 4.52.  Patient is at risk for malnutrition but unable to determine if he meets criteria for malnutrition at this time.  NUTRITION - FOCUSED PHYSICAL EXAM:  Unable to complete.  Diet Order:   Diet Order            DIET SOFT Room service appropriate? Yes; Fluid consistency: Thin  Diet effective now              EDUCATION NEEDS:   No  education needs have been identified at this time  Skin:  Skin Assessment: Skin Integrity Issues: Skin Integrity Issues:: Stage I Stage I: left heel  Last BM:  03/24/2018 - large type 6  Height:   Ht Readings from Last 1 Encounters:  03/23/19 5\' 10"  (1.778 m)   Weight:   Wt Readings from Last 1 Encounters:  03/25/19 91.7 kg   Ideal Body Weight:  75.5 kg  BMI:  Body mass index is 29.01 kg/m.  Estimated Nutritional Needs:   Kcal:  2100-2300  Protein:  105-115 grams  Fluid:  2.1-2.3 L/day  Jacklynn Barnacle, MS, RD, LDN Office: [phone] Pager: [phone] After Hours/Weekend Pager: [phone]

## 2019-03-25 NOTE — Progress Notes (Signed)
PROGRESS NOTE                                                                                                                                                                                                             Patient Demographics:    Marcus Weaver, is a 86 y.o. male, DOB - 11-Mar-1931, EYC:[id number]  Outpatient Primary MD for the patient is [name], MD    LOS - 3  Admit date - 03/22/2019    Chief Complaint  Patient presents with   Hyperglycemia       Brief Narrative - Marcus Weaver is a 86 y.o. male with medical history significant of abnormal EKG, CKD, type 2 diabetes, hyperlipidemia, hypertension, hypertensive heart disease, tachycardia, history of UTI, dementia who was transferred from [facility] after presenting there with a history of hyperglycemia over 500 mg/dL for a week according to the patient's daughter.  He was found to be in DKA along with COVID-19 pneumonia and sent to [facility].   Subjective:   Patient sitting in recliner, denies any headache chest or abdominal pain, mildly confused, appears to be in no discomfort.   Assessment  & Plan :     1. Acute Covid 19 Viral Pneumonitis during the ongoing 2020 Covid 19 Pandemic - he so far seems to have mild pulmonary disease, on IV steroids and remdesivir, currently not hypoxic will monitor closely.  He is actually currently on room air and not on 1 L.  Encouraged the patient to sit up in chair in the daytime use I-S and flutter valve for pulmonary toiletry and then prone in bed when at night.  SpO2: 94 % O2 Flow Rate (L/min): 1 L/min  Recent Labs  Lab 03/22/19 2238 03/23/19 2255 03/24/19 0621 03/25/19 0422  CRP  --  18.5* 15.7* 10.7*  DDIMER  --  6.83* 6.31* 8.63*  FERRITIN  --  787* 842*  --   BNP  --  68.8  --  85.2  PROCALCITON  --  0.99  --   --   SARSCOV2NAA POSITIVE*  --   --   --     Hepatic Function Latest Ref Rng & Units 03/25/2019  03/24/2019 03/22/2019  Total Protein 6.5 - 8.1 g/dL 5.9(L) 6.4(L) 7.5  Albumin 3.5 - 5.0 g/dL 2.1(L) 2.3(L) 2.9(L)  AST 15 - 41 U/L  $'23 24 31  'F$ ALT 0 - 44 U/L '14 14 17  '$ Alk Phosphatase 38 - 126 U/L 38 34(L) 41  Total Bilirubin 0.3 - 1.2 mg/dL 0.5 0.7 0.5  Bilirubin, Direct 0.0 - 0.3 mg/dL - - -    2.  DKA with wide anion gap metabolic acidosis in a patient with DM type II on insulin.  He has poor outpatient control with A1c close to 9 less than 2 months ago, repeat A1c, DKA has now resolved, transition to Levemir lower than home dose along with sensitive sliding scale before every meal, monitor CBGs closely, oral intake is still tenuous.  Lab Results  Component Value Date   HGBA1C 9.2 (A) 02/02/2019   CBG (last 3)  Recent Labs    03/25/19 0606 03/25/19 0707 03/25/19 0809  GLUCAP 146* 163* 180*    3.  AKI on 5.  Baseline creatinine close to 3.5, continue hydration, hold nephrotoxins, hold diuretics and ACE.  No hydronephrosis on renal ultrasound, formal report pending, renal function is improving with hydration which will be continued with half-normal saline, monitor sodium levels closely.  This is likely due to prerenal ATN.  Foley catheter was placed at [facility] for now continue, will try and remove on 03/25/2018 if renal function continues to improve.  4.  Essential hypertension.  Discontinue Coreg due to some episodes of resting bradycardia, replacement Norvasc, hold lisinopril and Lasix.  5.  Metabolic encephalopathy.  CT head negative, no focal deficits, was better this a.m. however during the afternoon again more delirious.  Restraints to prevent from pulling multiple lines which she already has, PICC line for now, supportive care, as needed Haldol ordered.  6.  Poor IV access.  PICC line placed on 03/24/2019.     Condition - Fair  Family Communication  :  Daughter Bee - on 03/24/2019 at 10:40 AM  Code Status : DNR  Diet :   Diet Order            DIET SOFT Room  service appropriate? Yes; Fluid consistency: Thin  Diet effective now               Disposition Plan  : To be decide  Consults  :  None  Procedures  :   PICC line and Foley placed this admission.    CT head.  Nonacute  PUD Prophylaxis :    DVT Prophylaxis  : Heparin   Lab Results  Component Value Date   PLT 246 03/25/2019    Inpatient Medications  Scheduled Meds:  amLODipine  10 mg Oral Daily   vitamin C  500 mg Oral Daily   Chlorhexidine Gluconate Cloth  6 each Topical Daily   [START ON 03/26/2019] dexamethasone (DECADRON) injection  2 mg Intravenous Q24H   feeding supplement (NEPRO CARB STEADY)  237 mL Oral BID BM   heparin injection (subcutaneous)  10,000 Units Subcutaneous Q8H   insulin aspart  0-9 Units Subcutaneous TID WC   insulin detemir  20 Units Subcutaneous BID   Ipratropium-Albuterol  2 puff Inhalation Q6H   sodium chloride flush  10-40 mL Intracatheter Q12H   zinc sulfate  220 mg Oral Daily   Continuous Infusions:  lactated ringers 75 mL/hr at 03/25/19 1448   remdesivir 100 mg in NS 100 mL 100 mg (03/25/19 0909)   PRN Meds:.acetaminophen **OR** [DISCONTINUED] acetaminophen, chlorpheniramine-HYDROcodone, dextrose, guaiFENesin-dextromethorphan, haloperidol lactate, [DISCONTINUED] ondansetron **OR** ondansetron (ZOFRAN) IV  Antibiotics  :    Anti-infectives (From admission,  onward)   Start     Dose/Rate Route Frequency Ordered Stop   03/24/19 1000  remdesivir 100 mg in sodium chloride 0.9 % 100 mL IVPB     100 mg 200 mL/hr over 30 Minutes Intravenous Daily 03/23/19 0522 03/28/19 0959   03/23/19 0600  remdesivir 200 mg in sodium chloride 0.9% 250 mL IVPB     200 mg 580 mL/hr over 30 Minutes Intravenous Once 03/23/19 0522 03/23/19 1235   03/22/19 2230  cefTRIAXone (ROCEPHIN) 1 g in sodium chloride 0.9 % 100 mL IVPB     1 g 200 mL/hr over 30 Minutes Intravenous  Once 03/22/19 2218 03/22/19 2301       Time Spent in minutes   30   Lala Lund M.D on 03/25/2019 at 10:38 AM  To page go to [email or web address] - password [email or web address]  [facility] -  Office  [phone]  See all Orders from today for further details    Objective:   Vitals:   03/24/19 2315 03/25/19 0408 03/25/19 0500 03/25/19 0811  BP: (!) 153/74 (!) 170/74  (!) 163/76  Pulse: 79 68  66  Resp: (!) 22 17  (!) 26  Temp: 97.6 F (36.4 C) 97.7 F (36.5 C)  (!) 97.4 F (36.3 C)  TempSrc:  Axillary  Oral  SpO2: 98% 91%  94%  Weight:   91.7 kg   Height:        Wt Readings from Last 3 Encounters:  03/25/19 91.7 kg  03/19/19 95.7 kg  02/02/19 95.8 kg     Intake/Output Summary (Last 24 hours) at 03/25/2019 1038 Last data filed at 03/25/2019 0909 Gross per 24 hour  Intake 1262.65 ml  Output 526 ml  Net 736.65 ml     Physical Exam  Awake but pleasantly confused, no focal deficits, Foley in place  [place].AT,PERRAL Supple Neck,No JVD, No cervical lymphadenopathy appriciated.  Symmetrical Chest wall movement, Good air movement bilaterally, CTAB RRR,No Gallops, Rubs or new Murmurs, No Parasternal Heave +ve B.Sounds, Abd Soft, No tenderness, No organomegaly appriciated, No rebound - guarding or rigidity. No Cyanosis, Clubbing or edema, No new Rash or bruise    Data Review:    CBC Recent Labs  Lab 03/19/19 1441 03/22/19 2011 03/22/19 2145 03/23/19 2255 03/24/19 0621 03/25/19 0422  WBC 5.9 7.0  --  6.0 7.0 10.1  HGB 11.7* 11.2* 11.2* 10.5* 9.9* 10.0*  HCT 35.6* 34.5* 33.0* 32.3* 30.9* 31.0*  PLT 183 194  --  210 219 246  MCV 88.6 88.5  --  88.7 87.5 88.3  MCH 29.1 28.7  --  28.8 28.0 28.5  MCHC 32.9 32.5  --  32.5 32.0 32.3  RDW 12.8 13.3  --  13.5 13.6 13.8  LYMPHSABS  --  0.8  --  0.5* 0.6* 0.5*  MONOABS  --  0.5  --  0.2 0.4 0.5  EOSABS  --  0.0  --  0.0 0.0 0.0  BASOSABS  --  0.0  --  0.0 0.0 0.0    Chemistries  Recent Labs  Lab 03/22/19 2011 03/22/19 2145 03/23/19 0604 03/24/19 0621 03/24/19 1502 03/25/19 0422  NA  138 135 142 143 146* 147*  K 4.5 6.8* 3.9 4.0 4.2 3.7  CL 102  --  109 107 110 111  CO2 21*  --  23 20* 21* 21*  GLUCOSE 505*  --  130* 172* 258* 100*  BUN 112*  --  112* 117* 116* 125*  CREATININE 5.78*  --  5.90* 5.36* 5.17* 4.52*  CALCIUM 8.4*  --  8.3* 8.1* 8.3* 8.1*  MG  --   --   --  2.2  --  2.2  AST 31  --   --  24  --  23  ALT 17  --   --  14  --  14  ALKPHOS 41  --   --  34*  --  38  BILITOT 0.5  --   --  0.7  --  0.5   ------------------------------------------------------------------------------------------------------------------ No results for input(s): CHOL, HDL, LDLCALC, TRIG, CHOLHDL, LDLDIRECT in the last 72 hours.  Lab Results  Component Value Date   HGBA1C 9.2 (A) 02/02/2019   ------------------------------------------------------------------------------------------------------------------ No results for input(s): TSH, T4TOTAL, T3FREE, THYROIDAB in the last 72 hours.  Invalid input(s): FREET3  Cardiac Enzymes No results for input(s): CKMB, TROPONINI, MYOGLOBIN in the last 168 hours.  Invalid input(s): CK ------------------------------------------------------------------------------------------------------------------    Component Value Date/Time   BNP 85.2 03/25/2019 0422    Micro Results Recent Results (from the past 240 hour(s))  Urine culture     Status: None   Collection Time: 03/22/19  9:30 PM   Specimen: Urine, Catheterized  Result Value Ref Range Status   Specimen Description   Final    URINE, CATHETERIZED Performed at [facility], [address] [place], [place] [address]    Special Requests   Final    NONE Performed at [facility], [address] [place], [place] [address]    Culture   Final    NO GROWTH Performed at [facility], [place] [address] [place],  [address]    Report Status 03/24/2019 FINAL  Final  SARS CORONAVIRUS 2 (TAT 6-24 HRS) Nasopharyngeal Nasopharyngeal Swab     Status: Abnormal    Collection Time: 03/22/19 10:38 PM   Specimen: Nasopharyngeal Swab  Result Value Ref Range Status   SARS Coronavirus 2 POSITIVE (A) NEGATIVE Final    Comment: RESULT CALLED TO, READ BACK BY AND VERIFIED WITH: S.GOUGE,RN [id number] I.MANNING (NOTE) SARS-CoV-2 target nucleic acids are DETECTED. The SARS-CoV-2 RNA is generally detectable in upper and lower respiratory specimens during the acute phase of infection. Positive results are indicative of the presence of SARS-CoV-2 RNA. Clinical correlation with patient history and other diagnostic information is  necessary to determine patient infection status. Positive results do not rule out bacterial infection or co-infection with other viruses.  The expected result is Negative. Fact Sheet for Patients: SugarRoll.be Fact Sheet for Healthcare Providers: https://www.woods-mathews.com/ This test is not yet approved or cleared by the [place] FDA and  has been authorized for detection and/or diagnosis of SARS-CoV-2 by FDA under an Emergency Use Authorization (EUA). This EUA will remain  in effect (meaning this test can be used) for the  duration of the COVID-19 declaration under Section 564(b)(1) of the Act, 21 U.S.C. section 360bbb-3(b)(1), unless the authorization is terminated or revoked sooner. Performed at [facility], [place] [address] [place], [place] [address]   SARS Coronavirus 2 Ag (30 min TAT) - Nasal Swab (BD Veritor Kit)     Status: Abnormal   Collection Time: 03/22/19 11:39 PM   Specimen: Nasal Swab (BD Veritor Kit)  Result Value Ref Range Status   SARS Coronavirus 2 Ag POSITIVE (A) NEGATIVE Final    Comment: RESULT CALLED TO, READ BACK BY AND VERIFIED WITH: MAYNARD,C AT 0007 ON [id number] BY CHERESNOWSKY,T (NOTE) SARS-CoV-2 antigen PRESENT. Positive results  indicate the presence of viral antigens, but clinical correlation with patient history and other  diagnostic information is necessary to determine patient infection status.  Positive results do not rule out bacterial infection or co-infection  with other viruses. False positive results are rare but can occur, and confirmatory RT-PCR testing may be appropriate in some circumstances. The expected result is Negative. Fact Sheet for Patients: PodPark.tn Fact Sheet for Providers: GiftContent.is  This test is not yet approved or cleared by the [place] FDA and  has been authorized for detection and/or diagnosis of SARS-CoV-2 by FDA under an Emergency Use Authorization (EUA).  This EUA will remain in effect (meaning this test can be used) for the duration of  the COVID- 19 declaration under Section 564(b)(1) of the Act, 21 U.S.C. section 360bbb-3(b)(1), unless the authorization is terminated or revoked sooner. Performed at [facility], [address] [place], [place] [address]     Radiology Reports CT Head Wo Contrast  Result Date: 03/22/2019 CLINICAL DATA:  Altered mental status EXAM: CT HEAD WITHOUT CONTRAST TECHNIQUE: Contiguous axial images were obtained from the base of the skull through the vertex without intravenous contrast. COMPARISON:  CT brain 05/15/2015 FINDINGS: Brain: No acute territorial infarction, hemorrhage, or intracranial mass is visualized. Moderate atrophy, slightly progressed since 2017. Moderate hypodensity in the white matter, likely chronic small vessel ischemic change. Moderate enlargement of the ventricles, slightly progressed since 2017. Vascular: No hyperdense vessels.  Carotid vascular calcification Skull: Normal. Negative for fracture or focal lesion. Sinuses/Orbits: Mild mucosal thickening in the sinuses. Old fractures of the medial walls of both orbits Other: None IMPRESSION: 1. No definite CT evidence for acute intracranial abnormality. 2. Moderate ventricular enlargement, slightly  increased since 2017, likely related to progression of atrophy. 3. Moderate hypodensity in the white matter consistent with chronic small vessel ischemic change Electronically Signed   By: Donavan Foil M.D.   On: 03/22/2019 19:48   DG Chest Port 1 View  Result Date: 03/23/2019 CLINICAL DATA:  Worsening dyspnea.  COVID-19 positive. EXAM: PORTABLE CHEST 1 VIEW COMPARISON:  Radiograph 3 hours ago. FINDINGS: Unchanged heart size and mediastinal contours. Aortic atherosclerosis. Minimal vague airspace opacity in the mid lower right lung zone. Mild central bronchial thickening. No pulmonary edema, pleural effusion or pneumothorax. Stable osseous structures. IMPRESSION: 1. Developing opacities in the right mid lower lung zone, likely related to COVID-19 pneumonia. 2. Central bronchial thickening. 3.  Aortic Atherosclerosis (ICD10-I70.0). Electronically Signed   By: Keith Rake M.D.   On: 03/23/2019 04:42   DG Chest Portable 1 View  Result Date: 03/23/2019 CLINICAL DATA:  Initial evaluation for acute altered mental status. EXAM: PORTABLE CHEST 1 VIEW COMPARISON:  Prior radiograph from 07/29/2009. FINDINGS: The cardiac and mediastinal silhouettes are stable in size and contour, and remain within normal limits. Aortic atherosclerosis. The lungs are normally inflated. No airspace consolidation, pleural effusion, or pulmonary edema. No pneumothorax. No acute osseous abnormality. IMPRESSION: 1. No radiographic evidence for active cardiopulmonary disease. 2.  Aortic Atherosclerosis (ICD10-I70.0). Electronically Signed   By: Jeannine Boga M.D.   On: 03/23/2019 00:49   [place] EKG SITE RITE  Result Date: 03/24/2019 If Site Rite image not attached, placement could not be confirmed due to current cardiac rhythm.

## 2019-03-26 LAB — COMPREHENSIVE METABOLIC PANEL
ALT: 16 U/L (ref 0–44)
AST: 25 U/L (ref 15–41)
Albumin: 2.3 g/dL — ABNORMAL LOW (ref 3.5–5.0)
Alkaline Phosphatase: 41 U/L (ref 38–126)
Anion gap: 14 (ref 5–15)
BUN: 121 mg/dL — ABNORMAL HIGH (ref 8–23)
CO2: 19 mmol/L — ABNORMAL LOW (ref 22–32)
Calcium: 8 mg/dL — ABNORMAL LOW (ref 8.9–10.3)
Chloride: 111 mmol/L (ref 98–111)
Creatinine, Ser: 4.15 mg/dL — ABNORMAL HIGH (ref 0.61–1.24)
GFR calc Af Amer: 14 mL/min — ABNORMAL LOW (ref >60)
GFR calc non Af Amer: 12 mL/min — ABNORMAL LOW (ref >60)
Glucose, Bld: 112 mg/dL — ABNORMAL HIGH (ref 70–99)
Potassium: 3.5 mmol/L (ref 3.5–5.1)
Sodium: 144 mmol/L (ref 135–145)
Total Bilirubin: 0.6 mg/dL (ref 0.3–1.2)
Total Protein: 6.2 g/dL — ABNORMAL LOW (ref 6.5–8.1)

## 2019-03-26 LAB — CBC WITH DIFFERENTIAL/PLATELET
Abs Immature Granulocytes: 0.08 10*3/uL — ABNORMAL HIGH (ref 0.00–0.07)
Basophils Absolute: 0 10*3/uL (ref 0.0–0.1)
Basophils Relative: 0 %
Eosinophils Absolute: 0 10*3/uL (ref 0.0–0.5)
Eosinophils Relative: 0 %
HCT: 31.9 % — ABNORMAL LOW (ref 39.0–52.0)
Hemoglobin: 10.4 g/dL — ABNORMAL LOW (ref 13.0–17.0)
Immature Granulocytes: 1 %
Lymphocytes Relative: 5 %
Lymphs Abs: 0.6 10*3/uL — ABNORMAL LOW (ref 0.7–4.0)
MCH: 28.5 pg (ref 26.0–34.0)
MCHC: 32.6 g/dL (ref 30.0–36.0)
MCV: 87.4 fL (ref 80.0–100.0)
Monocytes Absolute: 0.8 10*3/uL (ref 0.1–1.0)
Monocytes Relative: 6 %
Neutro Abs: 10.9 10*3/uL — ABNORMAL HIGH (ref 1.7–7.7)
Neutrophils Relative %: 88 %
Platelets: 259 10*3/uL (ref 150–400)
RBC: 3.65 MIL/uL — ABNORMAL LOW (ref 4.22–5.81)
RDW: 14 % (ref 11.5–15.5)
WBC: 12.3 10*3/uL — ABNORMAL HIGH (ref 4.0–10.5)
nRBC: 0 % (ref 0.0–0.2)

## 2019-03-26 LAB — GLUCOSE, CAPILLARY
Glucose-Capillary: 106 mg/dL — ABNORMAL HIGH (ref 70–99)
Glucose-Capillary: 175 mg/dL — ABNORMAL HIGH (ref 70–99)
Glucose-Capillary: 248 mg/dL — ABNORMAL HIGH (ref 70–99)
Glucose-Capillary: 256 mg/dL — ABNORMAL HIGH (ref 70–99)
Glucose-Capillary: 329 mg/dL — ABNORMAL HIGH (ref 70–99)
Glucose-Capillary: 76 mg/dL (ref 70–99)
Glucose-Capillary: 98 mg/dL (ref 70–99)

## 2019-03-26 LAB — BRAIN NATRIURETIC PEPTIDE: B Natriuretic Peptide: 104.6 pg/mL — ABNORMAL HIGH (ref 0.0–100.0)

## 2019-03-26 LAB — MAGNESIUM: Magnesium: 2.1 mg/dL (ref 1.7–2.4)

## 2019-03-26 LAB — C-REACTIVE PROTEIN: CRP: 7 mg/dL — ABNORMAL HIGH (ref <1.0)

## 2019-03-26 LAB — D-DIMER, QUANTITATIVE: D-Dimer, Quant: 7.69 ug/mL-FEU — ABNORMAL HIGH (ref 0.00–0.50)

## 2019-03-26 MED ORDER — HYDRALAZINE HCL 25 MG PO TABS
100.0000 mg | ORAL_TABLET | Freq: Three times a day (TID) | ORAL | Status: DC
  Administered 2019-03-26 – 2019-04-01 (×13): 100 mg via ORAL
  Filled 2019-03-26 (×15): qty 4

## 2019-03-26 MED ORDER — DEXAMETHASONE SODIUM PHOSPHATE 4 MG/ML IJ SOLN: 1.0000 mg | INTRAMUSCULAR | Status: DC

## 2019-03-26 MED ORDER — LORAZEPAM 2 MG/ML IJ SOLN
2.0000 mg | Freq: Once | INTRAMUSCULAR | Status: AC
  Administered 2019-03-26: 2 mg via INTRAMUSCULAR
  Filled 2019-03-26: qty 1

## 2019-03-26 MED ORDER — INSULIN DETEMIR 100 UNIT/ML ~~LOC~~ SOLN
15.0000 [IU] | Freq: Two times a day (BID) | SUBCUTANEOUS | Status: DC
  Administered 2019-03-26: 21:00:00 15 [IU] via SUBCUTANEOUS
  Filled 2019-03-26 (×2): qty 0.15

## 2019-03-26 MED ORDER — POTASSIUM CHLORIDE 20 MEQ PO PACK
40.0000 meq | PACK | Freq: Once | ORAL | Status: AC
  Administered 2019-03-26: 13:00:00 40 meq via ORAL
  Filled 2019-03-26: qty 2

## 2019-03-26 MED ORDER — INSULIN ASPART 100 UNIT/ML ~~LOC~~ SOLN
0.0000 [IU] | SUBCUTANEOUS | Status: DC
  Administered 2019-03-26: 17:00:00 5 [IU] via SUBCUTANEOUS
  Administered 2019-03-26: 21:00:00 8 [IU] via SUBCUTANEOUS
  Administered 2019-03-27: 22:00:00 2 [IU] via SUBCUTANEOUS
  Administered 2019-03-27: 01:00:00 3 [IU] via SUBCUTANEOUS
  Administered 2019-03-28: 13:00:00 5 [IU] via SUBCUTANEOUS
  Administered 2019-03-28: 17:00:00 2 [IU] via SUBCUTANEOUS
  Administered 2019-03-28: 08:00:00 15 [IU] via SUBCUTANEOUS
  Administered 2019-03-28: 21:00:00 3 [IU] via SUBCUTANEOUS
  Administered 2019-03-29: 08:00:00 5 [IU] via SUBCUTANEOUS
  Administered 2019-03-29: 21:00:00 3 [IU] via SUBCUTANEOUS
  Administered 2019-03-29: 01:00:00 2 [IU] via SUBCUTANEOUS
  Administered 2019-03-29 (×3): 3 [IU] via SUBCUTANEOUS
  Administered 2019-03-30: 12:00:00 8 [IU] via SUBCUTANEOUS
  Administered 2019-03-30: 2 [IU] via SUBCUTANEOUS
  Administered 2019-03-30: 05:00:00 5 [IU] via SUBCUTANEOUS
  Administered 2019-03-30: 10:00:00 2 [IU] via SUBCUTANEOUS
  Administered 2019-03-31: 8 [IU] via SUBCUTANEOUS
  Administered 2019-03-31 (×2): 2 [IU] via SUBCUTANEOUS
  Administered 2019-04-01: 3 [IU] via SUBCUTANEOUS

## 2019-03-26 MED ORDER — HALOPERIDOL LACTATE 5 MG/ML IJ SOLN
1.0000 mg | Freq: Four times a day (QID) | INTRAMUSCULAR | Status: DC | PRN
  Administered 2019-03-26: 21:00:00 1 mg via INTRAVENOUS
  Filled 2019-03-26: qty 1

## 2019-03-26 NOTE — Progress Notes (Signed)
OT Cancellation Note  Patient Details Name: Dhir Logan MRN: [id number] DOB: 1930-10-09   Cancelled Treatment:    Reason Eval/Treat Not Completed: Patient declined, no reason specified;Patient's level of consciousness;Other (comment)(Pt agitated). Per RN, pt attempting to hit staff this morning. Upon OT arrival, pt supine in bed sleeping. Pt easily awakens to name. Encouraged and educated pt on mobilizing with therapy and sitting up in chair. Pt declined and became agitated with therapist. RN updated. OT will continue to follow and attempt again tomorrow.   Mauri Brooklyn 03/26/2019, 12:29 PM

## 2019-03-26 NOTE — Plan of Care (Signed)
  Problem: Education: Goal: Knowledge of risk factors and measures for prevention of condition will improve Outcome: Progressing   Problem: Respiratory: Goal: Will maintain a patent airway Outcome: Progressing Goal: Complications related to the disease process, condition or treatment will be avoided or minimized Outcome: Progressing   

## 2019-03-26 NOTE — Progress Notes (Signed)
PROGRESS NOTE                                                                                                                                                                                                             Patient Demographics:    Marcus Weaver, is a 86 y.o. male, DOB - 1931/02/22, WVI:[id number]  Outpatient Primary MD for the patient is [name], MD    LOS - 4  Admit date - 03/22/2019    Chief Complaint  Patient presents with  . Hyperglycemia       Brief Narrative - Marcus Weaver is a 86 y.o. male with medical history significant of abnormal EKG, CKD, type 2 diabetes, hyperlipidemia, hypertension, hypertensive heart disease, tachycardia, history of UTI, dementia who was transferred from [facility] after presenting there with a history of hyperglycemia over 500 mg/dL for a week according to the patient's daughter.  He was found to be in DKA along with COVID-19 pneumonia and sent to [facility].  His main issue here has been encephalopathy.   Subjective:   Patient in bed, waking up, mildly confused but denies any headache chest or abdominal pain, says he is comfortable.  No shortness of breath or weakness.   Assessment  & Plan :     1. Acute Covid 19 Viral Pneumonitis during the ongoing 2020 Covid 19 Pandemic - he so far seems to have mild pulmonary disease, on IV steroids and remdesivir, currently not hypoxic will monitor closely.  He is actually currently on room air and not on 1 L.  Encouraged the patient to sit up in chair in the daytime use I-S and flutter valve for pulmonary toiletry and then prone in bed when at night.  SpO2: 95 % O2 Flow Rate (L/min): 1 L/min  Recent Labs  Lab 03/22/19 2238 03/23/19 2255 03/24/19 0621 03/25/19 0422 03/26/19 0650  CRP  --  18.5* 15.7* 10.7* 7.0*  DDIMER  --  6.83* 6.31* 8.63* 7.69*  FERRITIN  --  787* 842*  --   --   BNP  --  68.8  --  85.2 104.6*  PROCALCITON  --   0.99  --   --   --   SARSCOV2NAA POSITIVE*  --   --   --   --     Hepatic Function Latest Ref Rng &  Units 03/26/2019 03/25/2019 03/24/2019  Total Protein 6.5 - 8.1 g/dL 6.2(L) 5.9(L) 6.4(L)  Albumin 3.5 - 5.0 g/dL 2.3(L) 2.1(L) 2.3(L)  AST 15 - 41 U/L _0 ALT 0 - 44 U/L _1 Alk Phosphatase 38 - 126 U/L 41 38 34(L)  Total Bilirubin 0.3 - 1.2 mg/dL 0.6 0.5 0.7  Bilirubin, Direct 0.0 - 0.3 mg/dL - - -    2.  DKA with wide anion gap metabolic acidosis in a patient with DM type II on insulin.  He has poor outpatient control due to hyperglycemia A1c of 10.9, he was treated with DKA protocol requiring IV insulin and IV fluids, DKA has now resolved has been transitioned to Levemir twice daily along with sliding scale every 4 hours as oral intake is tenuous and steroid is being tapered.  Lab Results  Component Value Date   HGBA1C 10.9 (H) 03/25/2019   CBG (last 3)  Recent Labs    03/25/19 1637 03/25/19 2120 03/26/19 0808  GLUCAP 356* 235* 98    3.  AKI on 5.  Baseline creatinine close to 3.5, continue hydration, hold nephrotoxins, hold diuretics and ACE.  No hydronephrosis on renal ultrasound, formal report pending, renal function is improving with hydration which will be continued with half-normal saline, monitor sodium levels closely.  This is likely due to prerenal ATN.  Foley catheter was placed at [facility] for now continue, will try and remove on 03/25/2018 if renal function continues to improve.  4.  Essential hypertension.  Coreg held due to some resting bradycardia, currently on Norvasc will add hydralazine as well.  5.  Metabolic encephalopathy.  CT head negative, no focal deficits, was better this a.m. however during the afternoon again more delirious.  Restraints to prevent from pulling multiple lines which she already has, PICC line for now, supportive care, as needed Haldol ordered.  Plan is to sit him in the chair, keep blinds open room well late, as needed  Haldol, avoid Ativan.  Plan discussed with patient's nurse on 03/26/2019.  6.  Poor IV access.  PICC line placed on 03/24/2019.     Condition - Fair  Family Communication  :  Daughter Bee - on 03/24/2019 at 10:40 AM  Code Status : DNR  Diet :   Diet Order            DIET SOFT Room service appropriate? Yes; Fluid consistency: Thin  Diet effective now               Disposition Plan  : To be decide  Consults  :  None  Procedures  :   PICC line and Foley placed this admission.    CT head.  Nonacute  PUD Prophylaxis :    DVT Prophylaxis  : Heparin   Lab Results  Component Value Date   PLT 259 03/26/2019    Inpatient Medications  Scheduled Meds: . amLODipine  10 mg Oral Daily  . vitamin C  500 mg Oral Daily  . Chlorhexidine Gluconate Cloth  6 each Topical Daily  . [START ON 03/27/2019] dexamethasone (DECADRON) injection  1 mg Intravenous Q24H  . feeding supplement (NEPRO CARB STEADY)  237 mL Oral BID BM  . heparin injection (subcutaneous)  10,000 Units Subcutaneous Q8H  . insulin aspart  0-15 Units Subcutaneous Q4H  . insulin detemir  20 Units Subcutaneous BID  . Ipratropium-Albuterol  2 puff Inhalation Q6H  . sodium chloride flush  10-40 mL Intracatheter  Q12H  . zinc sulfate  220 mg Oral Daily   Continuous Infusions: . 0.45 % NaCl with KCl 20 mEq / L 75 mL/hr at 03/26/19 0600  . remdesivir 100 mg in NS 100 mL 100 mg (03/25/19 0909)   PRN Meds:.acetaminophen **OR** [DISCONTINUED] acetaminophen, chlorpheniramine-HYDROcodone, dextrose, guaiFENesin-dextromethorphan, haloperidol lactate, [DISCONTINUED] ondansetron **OR** ondansetron (ZOFRAN) IV  Antibiotics  :    Anti-infectives (From admission, onward)   Start     Dose/Rate Route Frequency Ordered Stop   03/24/19 1000  remdesivir 100 mg in sodium chloride 0.9 % 100 mL IVPB     100 mg 200 mL/hr over 30 Minutes Intravenous Daily 03/23/19 0522 03/28/19 0959   03/23/19 0600  remdesivir 200 mg in sodium chloride 0.9%  250 mL IVPB     200 mg 580 mL/hr over 30 Minutes Intravenous Once 03/23/19 0522 03/23/19 1235   03/22/19 2230  cefTRIAXone (ROCEPHIN) 1 g in sodium chloride 0.9 % 100 mL IVPB     1 g 200 mL/hr over 30 Minutes Intravenous  Once 03/22/19 2218 03/22/19 2301       Time Spent in minutes  30   Lala Lund M.D on 03/26/2019 at 9:59 AM  To page go to [email or web address] - password [email or web address]  [facility] -  Office  [phone]  See all Orders from today for further details    Objective:   Vitals:   03/25/19 2000 03/26/19 0400 03/26/19 0419 03/26/19 0801  BP: (!) 159/65 (!) 170/93  (!) 162/96  Pulse: 83 92  89  Resp: _0 Temp: 98.1 F (36.7 C) 98.4 F (36.9 C)    TempSrc: Oral Oral    SpO2: 94% 95%  95%  Weight:   90.8 kg   Height:        Wt Readings from Last 3 Encounters:  03/26/19 90.8 kg  03/19/19 95.7 kg  02/02/19 95.8 kg     Intake/Output Summary (Last 24 hours) at 03/26/2019 0959 Last data filed at 03/26/2019 0600 Gross per 24 hour  Intake 2031.55 ml  Output 1200 ml  Net 831.55 ml     Physical Exam  Awake but pleasantly confused, no focal deficits, Foley in place  [place].AT,PERRAL Supple Neck,No JVD, No cervical lymphadenopathy appriciated.  Symmetrical Chest wall movement, Good air movement bilaterally, CTAB RRR,No Gallops, Rubs or new Murmurs, No Parasternal Heave +ve B.Sounds, Abd Soft, No tenderness, No organomegaly appriciated, No rebound - guarding or rigidity. No Cyanosis, Clubbing or edema, No new Rash or bruise     Data Review:    CBC Recent Labs  Lab 03/22/19 2011 03/22/19 2145 03/23/19 2255 03/24/19 0621 03/25/19 0422 03/26/19 0650  WBC 7.0  --  6.0 7.0 10.1 12.3*  HGB 11.2* 11.2* 10.5* 9.9* 10.0* 10.4*  HCT 34.5* 33.0* 32.3* 30.9* 31.0* 31.9*  PLT 194  --  210 219 246 259  MCV 88.5  --  88.7 87.5 88.3 87.4  MCH 28.7  --  28.8 28.0 28.5 28.5  MCHC 32.5  --  32.5 32.0 32.3 32.6  RDW 13.3  --  13.5 13.6 13.8 14.0  LYMPHSABS 0.8   --  0.5* 0.6* 0.5* 0.6*  MONOABS 0.5  --  0.2 0.4 0.5 0.8  EOSABS 0.0  --  0.0 0.0 0.0 0.0  BASOSABS 0.0  --  0.0 0.0 0.0 0.0    Chemistries  Recent Labs  Lab 03/22/19 2011 03/23/19 0604 03/24/19 0621 03/24/19 1502 03/25/19 0422 03/26/19 0650  NA 138 142  143 146* 147* 144  K 4.5 3.9 4.0 4.2 3.7 3.5  CL 102 109 107 110 111 111  CO2 21* 23 20* 21* 21* 19*  GLUCOSE 505* 130* 172* 258* 100* 112*  BUN 112* 112* 117* 116* 125* 121*  CREATININE 5.78* 5.90* 5.36* 5.17* 4.52* 4.15*  CALCIUM 8.4* 8.3* 8.1* 8.3* 8.1* 8.0*  MG  --   --  2.2  --  2.2 2.1  AST 31  --  24  --  23 25  ALT 17  --  14  --  14 16  ALKPHOS 41  --  34*  --  38 41  BILITOT 0.5  --  0.7  --  0.5 0.6   ------------------------------------------------------------------------------------------------------------------ No results for input(s): CHOL, HDL, LDLCALC, TRIG, CHOLHDL, LDLDIRECT in the last 72 hours.  Lab Results  Component Value Date   HGBA1C 10.9 (H) 03/25/2019   ------------------------------------------------------------------------------------------------------------------ No results for input(s): TSH, T4TOTAL, T3FREE, THYROIDAB in the last 72 hours.  Invalid input(s): FREET3  Cardiac Enzymes No results for input(s): CKMB, TROPONINI, MYOGLOBIN in the last 168 hours.  Invalid input(s): CK ------------------------------------------------------------------------------------------------------------------    Component Value Date/Time   BNP 104.6 (H) 03/26/2019 0650    Micro Results Recent Results (from the past 240 hour(s))  Urine culture     Status: None   Collection Time: 03/22/19  9:30 PM   Specimen: Urine, Catheterized  Result Value Ref Range Status   Specimen Description   Final    URINE, CATHETERIZED Performed at [facility], [address] [place], [place] [address]    Special Requests   Final    NONE Performed at [facility], [address] [place], [place] [address]    Culture   Final    NO GROWTH Performed at [facility], [place] [address] [place], [place] [address]    Report Status 03/24/2019 FINAL  Final  SARS CORONAVIRUS 2 (TAT 6-24 HRS) Nasopharyngeal Nasopharyngeal Swab     Status: Abnormal   Collection Time: 03/22/19 10:38 PM   Specimen: Nasopharyngeal Swab  Result Value Ref Range Status   SARS Coronavirus 2 POSITIVE (A) NEGATIVE Final    Comment: RESULT CALLED TO, READ BACK BY AND VERIFIED WITH: S.GOUGE,RN [id number] I.MANNING (NOTE) SARS-CoV-2 target nucleic acids are DETECTED. The SARS-CoV-2 RNA is generally detectable in upper and lower respiratory specimens during the acute phase of infection. Positive results are indicative of the presence of SARS-CoV-2 RNA. Clinical correlation with patient history and other diagnostic information is  necessary to determine patient infection status. Positive results do not rule out bacterial infection or co-infection with other viruses.  The expected result is Negative. Fact Sheet for Patients: SugarRoll.be Fact Sheet for Healthcare Providers: https://www.woods-mathews.com/ This test is not yet approved or cleared by the [place] FDA and  has been authorized for detection and/or diagnosis of SARS-CoV-2 by FDA under an Emergency Use Authorization (EUA). This EUA will remain  in effect (meaning this test can be used) for the  duration of the COVID-19 declaration under Section 564(b)(1) of the Act, 21 U.S.C. section 360bbb-3(b)(1), unless the authorization is terminated or revoked sooner. Performed at [facility], [place] [address], [place], [place] [address]   SARS Coronavirus 2 Ag (30 min TAT) - Nasal Swab (BD Veritor Kit)     Status: Abnormal   Collection Time: 03/22/19 11:39 PM   Specimen: Nasal Swab (BD Veritor Kit)  Result Value Ref Range  Status   SARS Coronavirus 2 Ag POSITIVE (A) NEGATIVE Final    Comment: RESULT  CALLED TO, READ BACK BY AND VERIFIED WITH: MAYNARD,C AT 0007 ON [id number] BY CHERESNOWSKY,T (NOTE) SARS-CoV-2 antigen PRESENT. Positive results indicate the presence of viral antigens, but clinical correlation with patient history and other diagnostic information is necessary to determine patient infection status.  Positive results do not rule out bacterial infection or co-infection  with other viruses. False positive results are rare but can occur, and confirmatory RT-PCR testing may be appropriate in some circumstances. The expected result is Negative. Fact Sheet for Patients: PodPark.tn Fact Sheet for Providers: GiftContent.is  This test is not yet approved or cleared by the [place] FDA and  has been authorized for detection and/or diagnosis of SARS-CoV-2 by FDA under an Emergency Use Authorization (EUA).  This EUA will remain in effect (meaning this test can be used) for the duration of  the COVID- 19 declaration under Section 564(b)(1) of the Act, 21 U.S.C. section 360bbb-3(b)(1), unless the authorization is terminated or revoked sooner. Performed at [facility], [address] [place], [place] [address]     Radiology Reports CT Head Wo Contrast  Result Date: 03/22/2019 CLINICAL DATA:  Altered mental status EXAM: CT HEAD WITHOUT CONTRAST TECHNIQUE: Contiguous axial images were obtained from the base of the skull through the vertex without intravenous contrast. COMPARISON:  CT brain 05/15/2015 FINDINGS: Brain: No acute territorial infarction, hemorrhage, or intracranial mass is visualized. Moderate atrophy, slightly progressed since 2017. Moderate hypodensity in the white matter, likely chronic small vessel ischemic change. Moderate enlargement of the ventricles, slightly progressed since 2017. Vascular: No hyperdense vessels.  Carotid vascular calcification Skull: Normal. Negative for fracture or focal lesion.  Sinuses/Orbits: Mild mucosal thickening in the sinuses. Old fractures of the medial walls of both orbits Other: None IMPRESSION: 1. No definite CT evidence for acute intracranial abnormality. 2. Moderate ventricular enlargement, slightly increased since 2017, likely related to progression of atrophy. 3. Moderate hypodensity in the white matter consistent with chronic small vessel ischemic change Electronically Signed   By: Donavan Foil M.D.   On: 03/22/2019 19:48   DG Chest Port 1 View  Result Date: 03/23/2019 CLINICAL DATA:  Worsening dyspnea.  COVID-19 positive. EXAM: PORTABLE CHEST 1 VIEW COMPARISON:  Radiograph 3 hours ago. FINDINGS: Unchanged heart size and mediastinal contours. Aortic atherosclerosis. Minimal vague airspace opacity in the mid lower right lung zone. Mild central bronchial thickening. No pulmonary edema, pleural effusion or pneumothorax. Stable osseous structures. IMPRESSION: 1. Developing opacities in the right mid lower lung zone, likely related to COVID-19 pneumonia. 2. Central bronchial thickening. 3.  Aortic Atherosclerosis (ICD10-I70.0). Electronically Signed   By: Keith Rake M.D.   On: 03/23/2019 04:42   DG Chest Portable 1 View  Result Date: 03/23/2019 CLINICAL DATA:  Initial evaluation for acute altered mental status. EXAM: PORTABLE CHEST 1 VIEW COMPARISON:  Prior radiograph from 07/29/2009. FINDINGS: The cardiac and mediastinal silhouettes are stable in size and contour, and remain within normal limits. Aortic atherosclerosis. The lungs are normally inflated. No airspace consolidation, pleural effusion, or pulmonary edema. No pneumothorax. No acute osseous abnormality. IMPRESSION: 1. No radiographic evidence for active cardiopulmonary disease. 2.  Aortic Atherosclerosis (ICD10-I70.0). Electronically Signed   By: Jeannine Boga M.D.   On: 03/23/2019 00:49   [place] EKG SITE RITE  Result Date: 03/24/2019 If Site Rite image not attached, placement could not be confirmed  due to current cardiac rhythm.

## 2019-03-27 LAB — MAGNESIUM: Magnesium: 2.3 mg/dL (ref 1.7–2.4)

## 2019-03-27 LAB — CBC WITH DIFFERENTIAL/PLATELET
Abs Immature Granulocytes: 0.14 10*3/uL — ABNORMAL HIGH (ref 0.00–0.07)
Basophils Absolute: 0 10*3/uL (ref 0.0–0.1)
Basophils Relative: 0 %
Eosinophils Absolute: 0 10*3/uL (ref 0.0–0.5)
Eosinophils Relative: 0 %
HCT: 34.4 % — ABNORMAL LOW (ref 39.0–52.0)
Hemoglobin: 11.1 g/dL — ABNORMAL LOW (ref 13.0–17.0)
Immature Granulocytes: 1 %
Lymphocytes Relative: 7 %
Lymphs Abs: 0.8 10*3/uL (ref 0.7–4.0)
MCH: 28.3 pg (ref 26.0–34.0)
MCHC: 32.3 g/dL (ref 30.0–36.0)
MCV: 87.8 fL (ref 80.0–100.0)
Monocytes Absolute: 1 10*3/uL (ref 0.1–1.0)
Monocytes Relative: 9 %
Neutro Abs: 9.1 10*3/uL — ABNORMAL HIGH (ref 1.7–7.7)
Neutrophils Relative %: 83 %
Platelets: 286 10*3/uL (ref 150–400)
RBC: 3.92 MIL/uL — ABNORMAL LOW (ref 4.22–5.81)
RDW: 14.3 % (ref 11.5–15.5)
WBC: 11 10*3/uL — ABNORMAL HIGH (ref 4.0–10.5)
nRBC: 0 % (ref 0.0–0.2)

## 2019-03-27 LAB — COMPREHENSIVE METABOLIC PANEL
ALT: 25 U/L (ref 0–44)
AST: 46 U/L — ABNORMAL HIGH (ref 15–41)
Albumin: 2.7 g/dL — ABNORMAL LOW (ref 3.5–5.0)
Alkaline Phosphatase: 50 U/L (ref 38–126)
Anion gap: 11 (ref 5–15)
BUN: 108 mg/dL — ABNORMAL HIGH (ref 8–23)
CO2: 20 mmol/L — ABNORMAL LOW (ref 22–32)
Calcium: 8.6 mg/dL — ABNORMAL LOW (ref 8.9–10.3)
Chloride: 123 mmol/L — ABNORMAL HIGH (ref 98–111)
Creatinine, Ser: 3.94 mg/dL — ABNORMAL HIGH (ref 0.61–1.24)
GFR calc Af Amer: 15 mL/min — ABNORMAL LOW (ref >60)
GFR calc non Af Amer: 13 mL/min — ABNORMAL LOW (ref >60)
Glucose, Bld: 65 mg/dL — ABNORMAL LOW (ref 70–99)
Potassium: 4.2 mmol/L (ref 3.5–5.1)
Sodium: 154 mmol/L — ABNORMAL HIGH (ref 135–145)
Total Bilirubin: 0.8 mg/dL (ref 0.3–1.2)
Total Protein: 7 g/dL (ref 6.5–8.1)

## 2019-03-27 LAB — GLUCOSE, CAPILLARY
Glucose-Capillary: 58 mg/dL — ABNORMAL LOW (ref 70–99)
Glucose-Capillary: 149 mg/dL — ABNORMAL HIGH (ref 70–99)
Glucose-Capillary: 99 mg/dL (ref 70–99)
Glucose-Capillary: 56 mg/dL — ABNORMAL LOW (ref 70–99)
Glucose-Capillary: 153 mg/dL — ABNORMAL HIGH (ref 70–99)
Glucose-Capillary: 61 mg/dL — ABNORMAL LOW (ref 70–99)
Glucose-Capillary: 130 mg/dL — ABNORMAL HIGH (ref 70–99)

## 2019-03-27 LAB — D-DIMER, QUANTITATIVE: D-Dimer, Quant: 6.52 ug/mL-FEU — ABNORMAL HIGH (ref 0.00–0.50)

## 2019-03-27 LAB — BRAIN NATRIURETIC PEPTIDE: B Natriuretic Peptide: 136.1 pg/mL — ABNORMAL HIGH (ref 0.0–100.0)

## 2019-03-27 LAB — C-REACTIVE PROTEIN: CRP: 8.8 mg/dL — ABNORMAL HIGH (ref <1.0)

## 2019-03-27 MED ORDER — ACETAMINOPHEN 325 MG PO TABS: 325.0000 mg | ORAL_TABLET | Freq: Four times a day (QID) | ORAL | Status: DC | PRN

## 2019-03-27 MED ORDER — POTASSIUM CHLORIDE 2 MEQ/ML IV SOLN
INTRAVENOUS | Status: DC
  Filled 2019-03-27 (×2): qty 1000

## 2019-03-27 MED ORDER — HALOPERIDOL LACTATE 5 MG/ML IJ SOLN: 5.0000 mg | Freq: Four times a day (QID) | INTRAMUSCULAR | Status: DC | PRN

## 2019-03-27 MED ORDER — DEXTROSE 5 % IV SOLN: INTRAVENOUS | Status: DC

## 2019-03-27 MED ORDER — CARVEDILOL 12.5 MG PO TABS
12.5000 mg | ORAL_TABLET | Freq: Two times a day (BID) | ORAL | Status: DC
  Administered 2019-03-27 – 2019-04-01 (×9): 12.5 mg via ORAL
  Filled 2019-03-27 (×9): qty 1

## 2019-03-27 MED ORDER — LACTATED RINGERS IV SOLN: INTRAVENOUS | Status: DC

## 2019-03-27 MED ORDER — HEPARIN SODIUM (PORCINE) 10000 UNIT/ML IJ SOLN
7500.0000 [IU] | Freq: Three times a day (TID) | INTRAMUSCULAR | Status: DC
  Administered 2019-03-27 (×2): 7500 [IU] via SUBCUTANEOUS
  Administered 2019-03-28: 06:00:00 10000 [IU] via SUBCUTANEOUS
  Filled 2019-03-27 (×3): qty 1

## 2019-03-27 MED ORDER — QUETIAPINE FUMARATE 25 MG PO TABS
50.0000 mg | ORAL_TABLET | Freq: Two times a day (BID) | ORAL | Status: DC
  Administered 2019-03-27 – 2019-03-31 (×7): 50 mg via ORAL
  Filled 2019-03-27 (×7): qty 2

## 2019-03-27 NOTE — Progress Notes (Signed)
On assessment, PICC line found pulled out to 34cm. Removed remaining line and dressed per protocol. Line intact on removal. PIV access obtained.

## 2019-03-27 NOTE — Progress Notes (Signed)
PROGRESS NOTE                                                                                                                                                                                                             Patient Demographics:    Marcus Weaver, is a 86 y.o. male, DOB - [birth year], YKD:[id number]  Outpatient Primary MD for the patient is [name], MD    LOS - 5  Admit date - 03/22/2019    Chief Complaint  Patient presents with  . Hyperglycemia       Brief Narrative - Marcus Weaver is a 86 y.o. male with medical history significant of abnormal EKG, CKD, type 2 diabetes, hyperlipidemia, hypertension, hypertensive heart disease, tachycardia, history of UTI, dementia who was transferred from [facility] after presenting there with a history of hyperglycemia over 500 mg/dL for a week according to the patient's daughter.  He was found to be in DKA along with COVID-19 pneumonia and sent to [facility]  His main issue here has been encephalopathy.   Subjective:   Patient in bed, severely confused unable to answer questions or follow commands but moving all 4 extremities by himself, appears to be in no distress.   Assessment  & Plan :     1. Acute Covid 19 Viral Pneumonitis during the ongoing 2020 Covid 19 Pandemic - he so far seems to have mild pulmonary disease, on IV steroids and remdesivir, currently not hypoxic will monitor closely.  He is actually currently on room air and not on 1 L.  Encouraged the patient to sit up in chair in the daytime use I-S and flutter valve for pulmonary toiletry and then prone in bed when at night.  SpO2: 92 % O2 Flow Rate (L/min): 1 L/min  Recent Labs  Lab 03/22/19 2238 03/23/19 2255 03/24/19 0621 03/25/19 0422 03/26/19 0650  CRP  --  18.5* 15.7* 10.7* 7.0*  DDIMER  --  6.83* 6.31* 8.63* 7.69*  FERRITIN  --  787* 842*  --   --   BNP  --  68.8  --  85.2 104.6*  PROCALCITON  --   0.99  --   --   --   SARSCOV2NAA POSITIVE*  --   --   --   --     Hepatic Function Latest Ref Rng & Units  03/26/2019 03/25/2019 03/24/2019  Total Protein 6.5 - 8.1 g/dL 6.2(L) 5.9(L) 6.4(L)  Albumin 3.5 - 5.0 g/dL 2.3(L) 2.1(L) 2.3(L)  AST 15 - 41 U/L '25 23 24  '$ ALT 0 - 44 U/L '16 14 14  '$ Alk Phosphatase 38 - 126 U/L 41 38 34(L)  Total Bilirubin 0.3 - 1.2 mg/dL 0.6 0.5 0.7  Bilirubin, Direct 0.0 - 0.3 mg/dL - - -    2.  DKA with wide anion gap metabolic acidosis in a patient with DM type II on insulin.  He has poor outpatient control due to hyperglycemia A1c of 10.9, he was treated with DKA protocol requiring IV insulin and IV fluids, DKA has now resolved has been transitioned to SQ insulin, monitor loosely.  Dose lowered as oral intake is tenuous.  Nursing staff informed.  Lab Results  Component Value Date   HGBA1C 10.9 (H) 03/25/2019   CBG (last 3)  Recent Labs    03/27/19 0459 03/27/19 0531 03/27/19 0739  GLUCAP 58* 149* 99    3.  AKI on 5.  Baseline creatinine close to 3.5, continue hydration, hold nephrotoxins, hold diuretics and ACE.  Renal ultrasound nonacute, had a Foley catheter which was removed successfully on 03/26/2019, post void bladder scan stable, renal function currently stable, since oral intake is tenuous will place him on IV fluids for gentle hydration on 03/27/2019.  4.  Essential hypertension.  Coreg held due to some resting bradycardia, currently on Norvasc will add hydralazine as well.  5.  Metabolic encephalopathy.  CT head negative, no focal deficits, severely agitated, as needed Haldol, scheduled Seroquel, restraints as needed.  Family updated again on 03/27/2019.  6.  Poor IV access.  PICC line placed on 03/24/2019 unfortunately patient got delirious and pulled it out on 03/27/2019.     Condition - Fair  Family Communication  :  Daughter Bee - on 03/24/2019 at 10:40 AM  Code Status : DNR  Diet :   Diet Order            DIET SOFT Room service appropriate? Yes;  Fluid consistency: Thin  Diet effective now               Disposition Plan  : To be decide  Consults  :  None  Procedures  :   PICC line and Foley placed this admission.    CT head.  Nonacute  PUD Prophylaxis :    DVT Prophylaxis  : Heparin   Lab Results  Component Value Date   PLT 259 03/26/2019    Inpatient Medications  Scheduled Meds: . amLODipine  10 mg Oral Daily  . vitamin C  500 mg Oral Daily  . carvedilol  12.5 mg Oral BID  . Chlorhexidine Gluconate Cloth  6 each Topical Daily  . feeding supplement (NEPRO CARB STEADY)  237 mL Oral BID BM  . heparin injection (subcutaneous)  10,000 Units Subcutaneous Q8H  . hydrALAZINE  100 mg Oral Q8H  . insulin aspart  0-15 Units Subcutaneous Q4H  . Ipratropium-Albuterol  2 puff Inhalation Q6H  . QUEtiapine  50 mg Oral BID  . zinc sulfate  220 mg Oral Daily   Continuous Infusions: . lactated ringers    . remdesivir 100 mg in NS 100 mL 100 mg (03/27/19 0910)   PRN Meds:.acetaminophen, acetaminophen **OR** [DISCONTINUED] acetaminophen, dextrose, guaiFENesin-dextromethorphan, haloperidol lactate, [DISCONTINUED] ondansetron **OR** ondansetron (ZOFRAN) IV  Antibiotics  :    Anti-infectives (From admission, onward)   Start  Dose/Rate Route Frequency Ordered Stop   03/24/19 1000  remdesivir 100 mg in sodium chloride 0.9 % 100 mL IVPB     100 mg 200 mL/hr over 30 Minutes Intravenous Daily 03/23/19 0522 03/28/19 0959   03/23/19 0600  remdesivir 200 mg in sodium chloride 0.9% 250 mL IVPB     200 mg 580 mL/hr over 30 Minutes Intravenous Once 03/23/19 0522 03/23/19 1235   03/22/19 2230  cefTRIAXone (ROCEPHIN) 1 g in sodium chloride 0.9 % 100 mL IVPB     1 g 200 mL/hr over 30 Minutes Intravenous  Once 03/22/19 2218 03/22/19 2301       Time Spent in minutes  30   Lala Lund M.D on 03/27/2019 at 9:40 AM  To page go to [email or web address] - password [place]  [facility] -  Office  [phone]  See all Orders from  today for further details    Objective:   Vitals:   03/26/19 0801 03/26/19 1819 03/26/19 1924 03/27/19 0742  BP: (!) 162/96 112/72 (!) 171/87 (!) 177/105  Pulse: 89 88 89 (!) 115  Resp: '16 18  18  '$ Temp:  98.6 F (37 C) 98.7 F (37.1 C)   TempSrc:  Oral Oral   SpO2: 95% 93% (!) 88% 92%  Weight:      Height:        Wt Readings from Last 3 Encounters:  03/26/19 90.8 kg  03/19/19 95.7 kg  02/02/19 95.8 kg     Intake/Output Summary (Last 24 hours) at 03/27/2019 0940 Last data filed at 03/27/2019 0910 Gross per 24 hour  Intake 100 ml  Output 1059 ml  Net -959 ml     Physical Exam  Awake but  confused, no focal deficits  [place].AT,PERRAL Supple Neck,No JVD, No cervical lymphadenopathy appriciated.  Symmetrical Chest wall movement, Good air movement bilaterally, CTAB RRR,No Gallops, Rubs or new Murmurs, No Parasternal Heave +ve B.Sounds, Abd Soft, No tenderness, No organomegaly appriciated, No rebound - guarding or rigidity. No Cyanosis, Clubbing or edema, No new Rash or bruise   Data Review:    CBC Recent Labs  Lab 03/22/19 2011 03/22/19 2145 03/23/19 2255 03/24/19 0621 03/25/19 0422 03/26/19 0650  WBC 7.0  --  6.0 7.0 10.1 12.3*  HGB 11.2* 11.2* 10.5* 9.9* 10.0* 10.4*  HCT 34.5* 33.0* 32.3* 30.9* 31.0* 31.9*  PLT 194  --  210 219 246 259  MCV 88.5  --  88.7 87.5 88.3 87.4  MCH 28.7  --  28.8 28.0 28.5 28.5  MCHC 32.5  --  32.5 32.0 32.3 32.6  RDW 13.3  --  13.5 13.6 13.8 14.0  LYMPHSABS 0.8  --  0.5* 0.6* 0.5* 0.6*  MONOABS 0.5  --  0.2 0.4 0.5 0.8  EOSABS 0.0  --  0.0 0.0 0.0 0.0  BASOSABS 0.0  --  0.0 0.0 0.0 0.0    Chemistries  Recent Labs  Lab 03/22/19 2011 03/23/19 0604 03/24/19 0621 03/24/19 1502 03/25/19 0422 03/26/19 0650  NA 138 142 143 146* 147* 144  K 4.5 3.9 4.0 4.2 3.7 3.5  CL 102 109 107 110 111 111  CO2 21* 23 20* 21* 21* 19*  GLUCOSE 505* 130* 172* 258* 100* 112*  BUN 112* 112* 117* 116* 125* 121*  CREATININE 5.78* 5.90* 5.36*  5.17* 4.52* 4.15*  CALCIUM 8.4* 8.3* 8.1* 8.3* 8.1* 8.0*  MG  --   --  2.2  --  2.2 2.1  AST 31  --  24  --  23 25  ALT 17  --  14  --  14 16  ALKPHOS 41  --  34*  --  38 41  BILITOT 0.5  --  0.7  --  0.5 0.6   ------------------------------------------------------------------------------------------------------------------ No results for input(s): CHOL, HDL, LDLCALC, TRIG, CHOLHDL, LDLDIRECT in the last 72 hours.  Lab Results  Component Value Date   HGBA1C 10.9 (H) 03/25/2019   ------------------------------------------------------------------------------------------------------------------ No results for input(s): TSH, T4TOTAL, T3FREE, THYROIDAB in the last 72 hours.  Invalid input(s): FREET3  Cardiac Enzymes No results for input(s): CKMB, TROPONINI, MYOGLOBIN in the last 168 hours.  Invalid input(s): CK ------------------------------------------------------------------------------------------------------------------    Component Value Date/Time   BNP 104.6 (H) 03/26/2019 0650    Micro Results Recent Results (from the past 240 hour(s))  Urine culture     Status: None   Collection Time: 03/22/19  9:30 PM   Specimen: Urine, Catheterized  Result Value Ref Range Status   Specimen Description   Final    URINE, CATHETERIZED Performed at [facility], [address] [place], [place] [address]    Special Requests   Final    NONE Performed at [facility], [address] [place], [place] [address]    Culture   Final    NO GROWTH Performed at [facility], [place] [address] [place], [place] [address]    Report Status 03/24/2019 FINAL  Final  SARS CORONAVIRUS 2 (TAT 6-24 HRS) Nasopharyngeal Nasopharyngeal Swab     Status: Abnormal   Collection Time: 03/22/19 10:38 PM   Specimen: Nasopharyngeal Swab  Result Value Ref Range Status   SARS Coronavirus 2 POSITIVE (A) NEGATIVE Final    Comment: RESULT CALLED TO, READ BACK BY AND VERIFIED  WITH: S.GOUGE,RN [phone] [id number] I.MANNING (NOTE) SARS-CoV-2 target nucleic acids are DETECTED. The SARS-CoV-2 RNA is generally detectable in upper and lower respiratory specimens during the acute phase of infection. Positive results are indicative of the presence of SARS-CoV-2 RNA. Clinical correlation with patient history and other diagnostic information is  necessary to determine patient infection status. Positive results do not rule out bacterial infection or co-infection with other viruses.  The expected result is Negative. Fact Sheet for Patients: SugarRoll.be Fact Sheet for Healthcare Providers: https://www.woods-mathews.com/ This test is not yet approved or cleared by the [place] FDA and  has been authorized for detection and/or diagnosis of SARS-CoV-2 by FDA under an Emergency Use Authorization (EUA). This EUA will remain  in effect (meaning this test can be used) for the  duration of the COVID-19 declaration under Section 564(b)(1) of the Act, 21 U.S.C. section 360bbb-3(b)(1), unless the authorization is terminated or revoked sooner. Performed at [facility], [place] [address] [place], [place] [address]   SARS Coronavirus 2 Ag (30 min TAT) - Nasal Swab (BD Veritor Kit)     Status: Abnormal   Collection Time: 03/22/19 11:39 PM   Specimen: Nasal Swab (BD Veritor Kit)  Result Value Ref Range Status   SARS Coronavirus 2 Ag POSITIVE (A) NEGATIVE Final    Comment: RESULT CALLED TO, READ BACK BY AND VERIFIED WITH: MAYNARD,C AT 0007 ON [id number] BY CHERESNOWSKY,T (NOTE) SARS-CoV-2 antigen PRESENT. Positive results indicate the presence of viral antigens, but clinical correlation with patient history and other diagnostic information is necessary to determine patient infection status.  Positive results do not rule out bacterial infection or co-infection  with other viruses. False positive results are rare but can occur, and  confirmatory RT-PCR testing may be appropriate in some circumstances. The expected result is Negative. Fact Sheet for Patients: PodPark.tn Fact Sheet for Providers: GiftContent.is  This test is not yet approved or cleared by the [place] FDA and  has been authorized for detection and/or diagnosis of SARS-CoV-2 by FDA under an Emergency Use Authorization (EUA).  This EUA will remain in effect (meaning this test can be used) for the duration of  the COVID- 19 declaration under Section 564(b)(1) of the Act, 21 U.S.C. section 360bbb-3(b)(1), unless the authorization is terminated or revoked sooner. Performed at [facility], [address] [place], [place] [address]     Radiology Reports CT Head Wo Contrast  Result Date: 03/22/2019 CLINICAL DATA:  Altered mental status EXAM: CT HEAD WITHOUT CONTRAST TECHNIQUE: Contiguous axial images were obtained from the base of the skull through the vertex without intravenous contrast. COMPARISON:  CT brain 05/15/2015 FINDINGS: Brain: No acute territorial infarction, hemorrhage, or intracranial mass is visualized. Moderate atrophy, slightly progressed since 2017. Moderate hypodensity in the white matter, likely chronic small vessel ischemic change. Moderate enlargement of the ventricles, slightly progressed since 2017. Vascular: No hyperdense vessels.  Carotid vascular calcification Skull: Normal. Negative for fracture or focal lesion. Sinuses/Orbits: Mild mucosal thickening in the sinuses. Old fractures of the medial walls of both orbits Other: None IMPRESSION: 1. No definite CT evidence for acute intracranial abnormality. 2. Moderate ventricular enlargement, slightly increased since 2017, likely related to progression of atrophy. 3. Moderate hypodensity in the white matter consistent with chronic small vessel ischemic change Electronically Signed   By: Donavan Foil M.D.   On: 03/22/2019  19:48   DG Chest Port 1 View  Result Date: 03/23/2019 CLINICAL DATA:  Worsening dyspnea.  COVID-19 positive. EXAM: PORTABLE CHEST 1 VIEW COMPARISON:  Radiograph 3 hours ago. FINDINGS: Unchanged heart size and mediastinal contours. Aortic atherosclerosis. Minimal vague airspace opacity in the mid lower right lung zone. Mild central bronchial thickening. No pulmonary edema, pleural effusion or pneumothorax. Stable osseous structures. IMPRESSION: 1. Developing opacities in the right mid lower lung zone, likely related to COVID-19 pneumonia. 2. Central bronchial thickening. 3.  Aortic Atherosclerosis (ICD10-I70.0). Electronically Signed   By: Keith Rake M.D.   On: 03/23/2019 04:42   DG Chest Portable 1 View  Result Date: 03/23/2019 CLINICAL DATA:  Initial evaluation for acute altered mental status. EXAM: PORTABLE CHEST 1 VIEW COMPARISON:  Prior radiograph from 07/29/2009. FINDINGS: The cardiac and mediastinal silhouettes are stable in size and contour, and remain within normal limits. Aortic atherosclerosis. The lungs are normally inflated. No airspace consolidation, pleural effusion, or pulmonary edema. No pneumothorax. No acute osseous abnormality. IMPRESSION: 1. No radiographic evidence for active cardiopulmonary disease. 2.  Aortic Atherosclerosis (ICD10-I70.0). Electronically Signed   By: Jeannine Boga M.D.   On: 03/23/2019 00:49   [place] EKG SITE RITE  Result Date: 03/24/2019 If Site Rite image not attached, placement could not be confirmed due to current cardiac rhythm.

## 2019-03-27 NOTE — Progress Notes (Signed)
OT Cancellation Note  Patient Details Name: Marcus Weaver MRN: [id number] DOB: Aug 16, 1930   Cancelled Treatment:    Reason Eval/Treat Not Completed: Medical issues which prohibited therapy;Patient's level of consciousness. Upon arrival, RN in room attempting to give pt morning meds. Pt agitated and restless, in bilateral mitts and restraints. Pt spitting out food at [employer]. RN and OT in agreement to hold therapy at this. OT will continue to follow.   Mauri Brooklyn 03/27/2019, 9:17 AM

## 2019-03-28 LAB — COMPREHENSIVE METABOLIC PANEL
ALT: 24 U/L (ref 0–44)
AST: 37 U/L (ref 15–41)
Albumin: 2.6 g/dL — ABNORMAL LOW (ref 3.5–5.0)
Alkaline Phosphatase: 51 U/L (ref 38–126)
Anion gap: 15 (ref 5–15)
BUN: 103 mg/dL — ABNORMAL HIGH (ref 8–23)
CO2: 17 mmol/L — ABNORMAL LOW (ref 22–32)
Calcium: 8.2 mg/dL — ABNORMAL LOW (ref 8.9–10.3)
Chloride: 118 mmol/L — ABNORMAL HIGH (ref 98–111)
Creatinine, Ser: 3.74 mg/dL — ABNORMAL HIGH (ref 0.61–1.24)
GFR calc Af Amer: 16 mL/min — ABNORMAL LOW (ref >60)
GFR calc non Af Amer: 14 mL/min — ABNORMAL LOW (ref >60)
Glucose, Bld: 355 mg/dL — ABNORMAL HIGH (ref 70–99)
Potassium: 4.6 mmol/L (ref 3.5–5.1)
Sodium: 150 mmol/L — ABNORMAL HIGH (ref 135–145)
Total Bilirubin: 0.6 mg/dL (ref 0.3–1.2)
Total Protein: 6.8 g/dL (ref 6.5–8.1)

## 2019-03-28 LAB — CBC WITH DIFFERENTIAL/PLATELET
Abs Immature Granulocytes: 0.11 10*3/uL — ABNORMAL HIGH (ref 0.00–0.07)
Basophils Absolute: 0 10*3/uL (ref 0.0–0.1)
Basophils Relative: 0 %
Eosinophils Absolute: 0 10*3/uL (ref 0.0–0.5)
Eosinophils Relative: 0 %
HCT: 33.4 % — ABNORMAL LOW (ref 39.0–52.0)
Hemoglobin: 10.9 g/dL — ABNORMAL LOW (ref 13.0–17.0)
Immature Granulocytes: 1 %
Lymphocytes Relative: 8 %
Lymphs Abs: 0.6 10*3/uL — ABNORMAL LOW (ref 0.7–4.0)
MCH: 29.1 pg (ref 26.0–34.0)
MCHC: 32.6 g/dL (ref 30.0–36.0)
MCV: 89.3 fL (ref 80.0–100.0)
Monocytes Absolute: 0.9 10*3/uL (ref 0.1–1.0)
Monocytes Relative: 10 %
Neutro Abs: 6.9 10*3/uL (ref 1.7–7.7)
Neutrophils Relative %: 81 %
Platelets: 258 10*3/uL (ref 150–400)
RBC: 3.74 MIL/uL — ABNORMAL LOW (ref 4.22–5.81)
RDW: 14.6 % (ref 11.5–15.5)
WBC: 8.6 10*3/uL (ref 4.0–10.5)
nRBC: 0 % (ref 0.0–0.2)

## 2019-03-28 LAB — GLUCOSE, CAPILLARY
Glucose-Capillary: 145 mg/dL — ABNORMAL HIGH (ref 70–99)
Glucose-Capillary: 169 mg/dL — ABNORMAL HIGH (ref 70–99)
Glucose-Capillary: 179 mg/dL — ABNORMAL HIGH (ref 70–99)
Glucose-Capillary: 206 mg/dL — ABNORMAL HIGH (ref 70–99)
Glucose-Capillary: 218 mg/dL — ABNORMAL HIGH (ref 70–99)
Glucose-Capillary: 385 mg/dL — ABNORMAL HIGH (ref 70–99)

## 2019-03-28 LAB — MAGNESIUM: Magnesium: 2.2 mg/dL (ref 1.7–2.4)

## 2019-03-28 LAB — D-DIMER, QUANTITATIVE: D-Dimer, Quant: 3.26 ug/mL-FEU — ABNORMAL HIGH (ref 0.00–0.50)

## 2019-03-28 LAB — C-REACTIVE PROTEIN: CRP: 15.6 mg/dL — ABNORMAL HIGH (ref <1.0)

## 2019-03-28 LAB — BRAIN NATRIURETIC PEPTIDE: B Natriuretic Peptide: 125 pg/mL — ABNORMAL HIGH (ref 0.0–100.0)

## 2019-03-28 MED ORDER — DEXTROSE 5 % IV SOLN: INTRAVENOUS | Status: AC

## 2019-03-28 MED ORDER — INSULIN GLARGINE 100 UNIT/ML ~~LOC~~ SOLN
12.0000 [IU] | Freq: Every day | SUBCUTANEOUS | Status: DC
  Administered 2019-03-28 – 2019-03-30 (×3): 12 [IU] via SUBCUTANEOUS
  Filled 2019-03-28 (×5): qty 0.12

## 2019-03-28 MED ORDER — HALOPERIDOL LACTATE 5 MG/ML IJ SOLN
2.0000 mg | Freq: Four times a day (QID) | INTRAMUSCULAR | Status: DC | PRN
  Administered 2019-03-29 – 2019-03-31 (×2): 2 mg via INTRAVENOUS
  Filled 2019-03-28 (×3): qty 1

## 2019-03-28 MED ORDER — HEPARIN SODIUM (PORCINE) 5000 UNIT/ML IJ SOLN
5000.0000 [IU] | Freq: Three times a day (TID) | INTRAMUSCULAR | Status: DC
  Administered 2019-03-28 – 2019-04-01 (×13): 5000 [IU] via SUBCUTANEOUS
  Filled 2019-03-28 (×13): qty 1

## 2019-03-28 NOTE — Progress Notes (Signed)
PT Cancellation Note  Patient Details Name: Marcus Weaver MRN: [id number] DOB: 05-19-1930   Cancelled Treatment:    Reason Eval/Treat Not Completed: Patient declined, no reason specified;Fatigue/lethargy limiting ability to participate;Other (comment)(currently in mits, opened eyes only once after several attempts (cold washcloth, sternal fub, tactile stimuls on both arms)) He is currently not appropriate for PT as he cannot follow any commands, resistant to any change of position, trying to bite therapist. Will sign off until/if he stabilizes and is oriented.  Rollen Sox, PT # [phone] CGV cell  Casandra Doffing 03/28/2019, 11:18 AM

## 2019-03-28 NOTE — Progress Notes (Signed)
OT Cancellation Note  Patient Details Name: Marcus Weaver MRN: [id number] DOB: December 31, 1930   Cancelled Treatment:    Reason Eval/Treat Not Completed: Fatigue/lethargy limiting ability to participate;Patient declined, no reason specified;Patient's level of consciousness. Pt continues to be lethargic, restless, and agitated. Pt in bilateral mitts and restraints, resistant to any movement. Pt only able to open eyes once this morning following max cues, sternal rub, and tactile stimulus to both BUEs and BLEs. Pt attempting to bite therapists when repositioning. OT has attempted to see pt the last three days with no change in mentation or agitation. Pt unable to follow any commands. Pt is not appropriate for OT at this time with OT to sign off. Please re-order therapy when pt is able and willing to participate.   Mauri Brooklyn 03/28/2019, 11:23 AM

## 2019-03-28 NOTE — Progress Notes (Signed)
PROGRESS NOTE                                                                                                                                                                                                             Patient Demographics:    Marcus Weaver, is a 86 y.o. male, DOB - [birth year], FYB:[id number]  Outpatient Primary MD for the patient is [name], MD    LOS - 6  Admit date - 03/22/2019    Chief Complaint  Patient presents with  . Hyperglycemia       Brief Narrative - Marcus Weaver is a 86 y.o. male with medical history significant of abnormal EKG, CKD, type 2 diabetes, hyperlipidemia, hypertension, hypertensive heart disease, tachycardia, history of UTI, dementia who was transferred from [facility] after presenting there with a history of hyperglycemia over 500 mg/dL for a week according to the patient's daughter.  He was found to be in DKA along with COVID-19 pneumonia and sent to [facility].  His main issue here has been encephalopathy.   Subjective:   Patient in bed visibly confused unable to answer questions reliably or follow commands, appears comfortable.   Assessment  & Plan :     1. Acute Covid 19 Viral Pneumonitis during the ongoing 2020 Covid 19 Pandemic - he so far seems to have mild pulmonary disease, on IV steroids and remdesivir, currently not hypoxic will monitor closely.  He is actually currently on room air and not on 1 L.  Encouraged the patient to sit up in chair in the daytime use I-S and flutter valve for pulmonary toiletry and then prone in bed when at night.  SpO2: 100 % O2 Flow Rate (L/min): 1 L/min  Recent Labs  Lab 03/22/19 2238 03/23/19 2255 03/24/19 0621 03/25/19 0422 03/26/19 0650 03/27/19 0944 03/28/19 0415  CRP  --  18.5* 15.7* 10.7* 7.0* 8.8* 15.6*  DDIMER  --  6.83* 6.31* 8.63* 7.69* 6.52* 3.26*  FERRITIN  --  787* 842*  --   --   --   --   BNP  --  68.8  --  85.2  104.6* 136.1* 125.0*  PROCALCITON  --  0.99  --   --   --   --   --   SARSCOV2NAA POSITIVE*  --   --   --   --   --   --  Hepatic Function Latest Ref Rng & Units 03/28/2019 03/27/2019 03/26/2019  Total Protein 6.5 - 8.1 g/dL 6.8 7.0 6.2(L)  Albumin 3.5 - 5.0 g/dL 2.6(L) 2.7(L) 2.3(L)  AST 15 - 41 U/L 37 46(H) 25  ALT 0 - 44 U/L '24 25 16  '$ Alk Phosphatase 38 - 126 U/L 51 50 41  Total Bilirubin 0.3 - 1.2 mg/dL 0.6 0.8 0.6  Bilirubin, Direct 0.0 - 0.3 mg/dL - - -    2.  DKA with wide anion gap metabolic acidosis in a patient with DM type II on insulin.  He has poor outpatient control due to hyperglycemia A1c of 10.9, he was treated with DKA protocol requiring IV insulin and IV fluids, DKA has now resolved has been transitioned to SQ insulin, monitor loosely.  Dose lowered as oral intake is tenuous.  Nursing staff informed.  Lab Results  Component Value Date   HGBA1C 10.9 (H) 03/25/2019   CBG (last 3)  Recent Labs    03/27/19 2349 03/28/19 0713 03/28/19 1114  GLUCAP 206* 385* 218*    3.  AKI on 5.  Baseline creatinine close to 3.5, continue hydration, hold nephrotoxins, hold diuretics and ACE.  Renal ultrasound nonacute, had a Foley catheter which was removed successfully on 03/26/2019, post void bladder scan stable, renal function currently stable, since oral intake is tenuous will place him on IV fluids for gentle hydration on 03/27/2019.  4.  Essential hypertension.  Coreg held due to some resting bradycardia, currently on Norvasc will add hydralazine as well.  5.  Metabolic encephalopathy.  CT head negative, no focal deficits, severely agitated, as needed Haldol, scheduled Seroquel, restraints as needed.  Family updated again on 03/27/2019.  His severe encephalopathy is preventing him from eating or drinking anything for several days, will involve palliative care as well to discuss goals of care with family.  6.  Poor IV access.  PICC line placed on 03/24/2019 unfortunately patient got  delirious and pulled it out on 03/27/2019.     Condition - Fair  Family Communication  :  Daughter Bee - on 03/24/2019 at 10:40 AM, 03/27/2019 understands the poor prognosis  Code Status : DNR  Diet :   Diet Order            DIET SOFT Room service appropriate? Yes; Fluid consistency: Thin  Diet effective now               Disposition Plan  : To be decide  Consults  : Palliative care for goals of care  Procedures  :   PICC line and Foley placed this admission.    CT head.  Nonacute  PUD Prophylaxis :    DVT Prophylaxis  : Heparin   Lab Results  Component Value Date   PLT 258 03/28/2019    Inpatient Medications  Scheduled Meds: . amLODipine  10 mg Oral Daily  . vitamin C  500 mg Oral Daily  . carvedilol  12.5 mg Oral BID  . Chlorhexidine Gluconate Cloth  6 each Topical Daily  . feeding supplement (NEPRO CARB STEADY)  237 mL Oral BID BM  . heparin injection (subcutaneous)  5,000 Units Subcutaneous Q8H  . hydrALAZINE  100 mg Oral Q8H  . insulin aspart  0-15 Units Subcutaneous Q4H  . insulin glargine  12 Units Subcutaneous Daily  . Ipratropium-Albuterol  2 puff Inhalation Q6H  . QUEtiapine  50 mg Oral BID  . zinc sulfate  220 mg Oral Daily   Continuous Infusions: .  dextrose 100 mL/hr at 03/28/19 0735   PRN Meds:.acetaminophen, acetaminophen **OR** [DISCONTINUED] acetaminophen, dextrose, guaiFENesin-dextromethorphan, haloperidol lactate, [DISCONTINUED] ondansetron **OR** ondansetron (ZOFRAN) IV  Antibiotics  :    Anti-infectives (From admission, onward)   Start     Dose/Rate Route Frequency Ordered Stop   03/24/19 1000  remdesivir 100 mg in sodium chloride 0.9 % 100 mL IVPB     100 mg 200 mL/hr over 30 Minutes Intravenous Daily 03/23/19 0522 03/27/19 0940   03/23/19 0600  remdesivir 200 mg in sodium chloride 0.9% 250 mL IVPB     200 mg 580 mL/hr over 30 Minutes Intravenous Once 03/23/19 0522 03/23/19 1235   03/22/19 2230  cefTRIAXone (ROCEPHIN) 1 g in sodium  chloride 0.9 % 100 mL IVPB     1 g 200 mL/hr over 30 Minutes Intravenous  Once 03/22/19 2218 03/22/19 2301       Time Spent in minutes  30   Lala Lund M.D on 03/28/2019 at 11:50 AM  To page go to [email or web address] - password [facility]  [facility] -  Office  [phone]  See all Orders from today for further details    Objective:   Vitals:   03/28/19 0600 03/28/19 0739 03/28/19 0929 03/28/19 1129  BP:  123/78    Pulse:  (!) 104    Resp:  18    Temp:      TempSrc:      SpO2: 97% 96% 100% 100%  Weight:      Height:        Wt Readings from Last 3 Encounters:  03/28/19 88.5 kg  03/19/19 95.7 kg  02/02/19 95.8 kg     Intake/Output Summary (Last 24 hours) at 03/28/2019 1150 Last data filed at 03/27/2019 1849 Gross per 24 hour  Intake 150 ml  Output 350 ml  Net -200 ml     Physical Exam  Awake but  confused, no focal deficits  [place].AT,PERRAL Supple Neck,No JVD, No cervical lymphadenopathy appriciated.  Symmetrical Chest wall movement, Good air movement bilaterally, CTAB RRR,No Gallops, Rubs or new Murmurs, No Parasternal Heave +ve B.Sounds, Abd Soft, No tenderness, No organomegaly appriciated, No rebound - guarding or rigidity. No Cyanosis, Clubbing or edema, No new Rash or bruise    Data Review:    CBC Recent Labs  Lab 03/24/19 0621 03/25/19 0422 03/26/19 0650 03/27/19 0944 03/28/19 0415  WBC 7.0 10.1 12.3* 11.0* 8.6  HGB 9.9* 10.0* 10.4* 11.1* 10.9*  HCT 30.9* 31.0* 31.9* 34.4* 33.4*  PLT 219 246 259 286 258  MCV 87.5 88.3 87.4 87.8 89.3  MCH 28.0 28.5 28.5 28.3 29.1  MCHC 32.0 32.3 32.6 32.3 32.6  RDW 13.6 13.8 14.0 14.3 14.6  LYMPHSABS 0.6* 0.5* 0.6* 0.8 0.6*  MONOABS 0.4 0.5 0.8 1.0 0.9  EOSABS 0.0 0.0 0.0 0.0 0.0  BASOSABS 0.0 0.0 0.0 0.0 0.0    Chemistries  Recent Labs  Lab 03/24/19 0621 03/24/19 1502 03/25/19 0422 03/26/19 0650 03/27/19 0944 03/28/19 0415  NA 143 146* 147* 144 154* 150*  K 4.0 4.2 3.7 3.5 4.2 4.6  CL 107 110  111 111 123* 118*  CO2 20* 21* 21* 19* 20* 17*  GLUCOSE 172* 258* 100* 112* 65* 355*  BUN 117* 116* 125* 121* 108* 103*  CREATININE 5.36* 5.17* 4.52* 4.15* 3.94* 3.74*  CALCIUM 8.1* 8.3* 8.1* 8.0* 8.6* 8.2*  MG 2.2  --  2.2 2.1 2.3 2.2  AST 24  --  23 25 46* 37  ALT 14  --  $'14 16 25 24  'd$ ALKPHOS 34*  --  38 41 50 51  BILITOT 0.7  --  0.5 0.6 0.8 0.6   ------------------------------------------------------------------------------------------------------------------ No results for input(s): CHOL, HDL, LDLCALC, TRIG, CHOLHDL, LDLDIRECT in the last 72 hours.  Lab Results  Component Value Date   HGBA1C 10.9 (H) 03/25/2019   ------------------------------------------------------------------------------------------------------------------ No results for input(s): TSH, T4TOTAL, T3FREE, THYROIDAB in the last 72 hours.  Invalid input(s): FREET3  Cardiac Enzymes No results for input(s): CKMB, TROPONINI, MYOGLOBIN in the last 168 hours.  Invalid input(s): CK ------------------------------------------------------------------------------------------------------------------    Component Value Date/Time   BNP 125.0 (H) 03/28/2019 0415    Micro Results Recent Results (from the past 240 hour(s))  Urine culture     Status: None   Collection Time: 03/22/19  9:30 PM   Specimen: Urine, Catheterized  Result Value Ref Range Status   Specimen Description   Final    URINE, CATHETERIZED Performed at [facility], [address] [place], [place] [address]    Special Requests   Final    NONE Performed at [facility], [address] [place], [place] [address]    Culture   Final    NO GROWTH Performed at [facility], [place] [address], [place], [place] [address]    Report Status 03/24/2019 FINAL  Final  SARS CORONAVIRUS 2 (TAT 6-24 HRS) Nasopharyngeal Nasopharyngeal Swab     Status: Abnormal   Collection Time: 03/22/19 10:38 PM   Specimen: Nasopharyngeal Swab    Result Value Ref Range Status   SARS Coronavirus 2 POSITIVE (A) NEGATIVE Final    Comment: RESULT CALLED TO, READ BACK BY AND VERIFIED WITH: S.GOUGE,RN [id number] I.MANNING (NOTE) SARS-CoV-2 target nucleic acids are DETECTED. The SARS-CoV-2 RNA is generally detectable in upper and lower respiratory specimens during the acute phase of infection. Positive results are indicative of the presence of SARS-CoV-2 RNA. Clinical correlation with patient history and other diagnostic information is  necessary to determine patient infection status. Positive results do not rule out bacterial infection or co-infection with other viruses.  The expected result is Negative. Fact Sheet for Patients: SugarRoll.be Fact Sheet for Healthcare Providers: https://www.woods-mathews.com/ This test is not yet approved or cleared by the [place] FDA and  has been authorized for detection and/or diagnosis of SARS-CoV-2 by FDA under an Emergency Use Authorization (EUA). This EUA will remain  in effect (meaning this test can be used) for the  duration of the COVID-19 declaration under Section 564(b)(1) of the Act, 21 U.S.C. section 360bbb-3(b)(1), unless the authorization is terminated or revoked sooner. Performed at [facility], [place] [address], [place], [place] [address]   SARS Coronavirus 2 Ag (30 min TAT) - Nasal Swab (BD Veritor Kit)     Status: Abnormal   Collection Time: 03/22/19 11:39 PM   Specimen: Nasal Swab (BD Veritor Kit)  Result Value Ref Range Status   SARS Coronavirus 2 Ag POSITIVE (A) NEGATIVE Final    Comment: RESULT CALLED TO, READ BACK BY AND VERIFIED WITH: MAYNARD,C AT 0007 ON [id number] BY CHERESNOWSKY,T (NOTE) SARS-CoV-2 antigen PRESENT. Positive results indicate the presence of viral antigens, but clinical correlation with patient history and other diagnostic information is necessary to determine patient infection status.  Positive  results do not rule out bacterial infection or co-infection  with other viruses. False positive results are rare but can occur, and confirmatory RT-PCR testing may be appropriate in some circumstances. The expected result  is Negative. Fact Sheet for Patients: PodPark.tn Fact Sheet for Providers: GiftContent.is  This test is not yet approved or cleared by the [place] FDA and  has been authorized for detection and/or diagnosis of SARS-CoV-2 by FDA under an Emergency Use Authorization (EUA).  This EUA will remain in effect (meaning this test can be used) for the duration of  the COVID- 19 declaration under Section 564(b)(1) of the Act, 21 U.S.C. section 360bbb-3(b)(1), unless the authorization is terminated or revoked sooner. Performed at [facility], [address] [place], [place] [address]     Radiology Reports CT Head Wo Contrast  Result Date: 03/22/2019 CLINICAL DATA:  Altered mental status EXAM: CT HEAD WITHOUT CONTRAST TECHNIQUE: Contiguous axial images were obtained from the base of the skull through the vertex without intravenous contrast. COMPARISON:  CT brain 05/15/2015 FINDINGS: Brain: No acute territorial infarction, hemorrhage, or intracranial mass is visualized. Moderate atrophy, slightly progressed since 2017. Moderate hypodensity in the white matter, likely chronic small vessel ischemic change. Moderate enlargement of the ventricles, slightly progressed since 2017. Vascular: No hyperdense vessels.  Carotid vascular calcification Skull: Normal. Negative for fracture or focal lesion. Sinuses/Orbits: Mild mucosal thickening in the sinuses. Old fractures of the medial walls of both orbits Other: None IMPRESSION: 1. No definite CT evidence for acute intracranial abnormality. 2. Moderate ventricular enlargement, slightly increased since 2017, likely related to progression of atrophy. 3. Moderate hypodensity in  the white matter consistent with chronic small vessel ischemic change Electronically Signed   By: Donavan Foil M.D.   On: 03/22/2019 19:48   US RENAL  Result Date: 03/27/2019 CLINICAL DATA:  Acute kidney injury, hypertension, type II diabetes mellitus EXAM: RENAL / URINARY TRACT ULTRASOUND COMPLETE COMPARISON:  05/15/2015 FINDINGS: Right Kidney: Renal measurements: 9.5 x 5.0 x 5.1 cm = volume: 125 mL. Mild cortical thinning. Increased cortical echogenicity. No mass, hydronephrosis or shadowing calcification. Left Kidney: Renal measurements: 9.7 x 5.4 x 5.2 cm = volume: 144 mL. Mild cortical thinning. Increased cortical echogenicity. No mass, hydronephrosis or shadowing calcification. Bladder: Decompressed by Foley catheter, unable to evaluate Other: N/A IMPRESSION: Medical renal disease changes of both kidneys without evidence of renal mass or hydronephrosis. Electronically Signed   By: Lavonia Dana M.D.   On: 03/27/2019 15:05   DG Chest Port 1 View  Result Date: 03/23/2019 CLINICAL DATA:  Worsening dyspnea.  COVID-19 positive. EXAM: PORTABLE CHEST 1 VIEW COMPARISON:  Radiograph 3 hours ago. FINDINGS: Unchanged heart size and mediastinal contours. Aortic atherosclerosis. Minimal vague airspace opacity in the mid lower right lung zone. Mild central bronchial thickening. No pulmonary edema, pleural effusion or pneumothorax. Stable osseous structures. IMPRESSION: 1. Developing opacities in the right mid lower lung zone, likely related to COVID-19 pneumonia. 2. Central bronchial thickening. 3.  Aortic Atherosclerosis (ICD10-I70.0). Electronically Signed   By: Keith Rake M.D.   On: 03/23/2019 04:42   DG Chest Portable 1 View  Result Date: 03/23/2019 CLINICAL DATA:  Initial evaluation for acute altered mental status. EXAM: PORTABLE CHEST 1 VIEW COMPARISON:  Prior radiograph from 07/29/2009. FINDINGS: The cardiac and mediastinal silhouettes are stable in size and contour, and remain within normal limits.  Aortic atherosclerosis. The lungs are normally inflated. No airspace consolidation, pleural effusion, or pulmonary edema. No pneumothorax. No acute osseous abnormality. IMPRESSION: 1. No radiographic evidence for active cardiopulmonary disease. 2.  Aortic Atherosclerosis (ICD10-I70.0). Electronically Signed   By: Jeannine Boga M.D.   On: 03/23/2019 00:49   [place] EKG  SITE RITE  Result Date: 03/24/2019 If Site Rite image not attached, placement could not be confirmed due to current cardiac rhythm.

## 2019-03-29 LAB — CBC WITH DIFFERENTIAL/PLATELET
Abs Immature Granulocytes: 0.11 10*3/uL — ABNORMAL HIGH (ref 0.00–0.07)
Basophils Absolute: 0 10*3/uL (ref 0.0–0.1)
Basophils Relative: 0 %
Eosinophils Absolute: 0 10*3/uL (ref 0.0–0.5)
Eosinophils Relative: 1 %
HCT: 31.2 % — ABNORMAL LOW (ref 39.0–52.0)
Hemoglobin: 10.2 g/dL — ABNORMAL LOW (ref 13.0–17.0)
Immature Granulocytes: 1 %
Lymphocytes Relative: 9 %
Lymphs Abs: 0.8 10*3/uL (ref 0.7–4.0)
MCH: 29.2 pg (ref 26.0–34.0)
MCHC: 32.7 g/dL (ref 30.0–36.0)
MCV: 89.4 fL (ref 80.0–100.0)
Monocytes Absolute: 0.9 10*3/uL (ref 0.1–1.0)
Monocytes Relative: 10 %
Neutro Abs: 6.8 10*3/uL (ref 1.7–7.7)
Neutrophils Relative %: 79 %
Platelets: 254 10*3/uL (ref 150–400)
RBC: 3.49 MIL/uL — ABNORMAL LOW (ref 4.22–5.81)
RDW: 14.6 % (ref 11.5–15.5)
WBC: 8.6 10*3/uL (ref 4.0–10.5)
nRBC: 0 % (ref 0.0–0.2)

## 2019-03-29 LAB — COMPREHENSIVE METABOLIC PANEL
ALT: 22 U/L (ref 0–44)
AST: 29 U/L (ref 15–41)
Albumin: 2.4 g/dL — ABNORMAL LOW (ref 3.5–5.0)
Alkaline Phosphatase: 50 U/L (ref 38–126)
Anion gap: 13 (ref 5–15)
BUN: 93 mg/dL — ABNORMAL HIGH (ref 8–23)
CO2: 19 mmol/L — ABNORMAL LOW (ref 22–32)
Calcium: 8.3 mg/dL — ABNORMAL LOW (ref 8.9–10.3)
Chloride: 117 mmol/L — ABNORMAL HIGH (ref 98–111)
Creatinine, Ser: 3.8 mg/dL — ABNORMAL HIGH (ref 0.61–1.24)
GFR calc Af Amer: 16 mL/min — ABNORMAL LOW (ref >60)
GFR calc non Af Amer: 14 mL/min — ABNORMAL LOW (ref >60)
Glucose, Bld: 154 mg/dL — ABNORMAL HIGH (ref 70–99)
Potassium: 4.3 mmol/L (ref 3.5–5.1)
Sodium: 149 mmol/L — ABNORMAL HIGH (ref 135–145)
Total Bilirubin: 0.9 mg/dL (ref 0.3–1.2)
Total Protein: 6.4 g/dL — ABNORMAL LOW (ref 6.5–8.1)

## 2019-03-29 LAB — BRAIN NATRIURETIC PEPTIDE: B Natriuretic Peptide: 91.5 pg/mL (ref 0.0–100.0)

## 2019-03-29 LAB — GLUCOSE, CAPILLARY
Glucose-Capillary: 149 mg/dL — ABNORMAL HIGH (ref 70–99)
Glucose-Capillary: 193 mg/dL — ABNORMAL HIGH (ref 70–99)
Glucose-Capillary: 241 mg/dL — ABNORMAL HIGH (ref 70–99)
Glucose-Capillary: 192 mg/dL — ABNORMAL HIGH (ref 70–99)
Glucose-Capillary: 161 mg/dL — ABNORMAL HIGH (ref 70–99)

## 2019-03-29 LAB — C-REACTIVE PROTEIN: CRP: 18.3 mg/dL — ABNORMAL HIGH (ref <1.0)

## 2019-03-29 LAB — MAGNESIUM: Magnesium: 2.3 mg/dL (ref 1.7–2.4)

## 2019-03-29 LAB — D-DIMER, QUANTITATIVE: D-Dimer, Quant: 3.11 ug/mL-FEU — ABNORMAL HIGH (ref 0.00–0.50)

## 2019-03-29 MED ORDER — BISACODYL 10 MG RE SUPP
10.0000 mg | Freq: Every day | RECTAL | Status: AC
  Administered 2019-03-29 – 2019-03-31 (×3): 10 mg via RECTAL
  Filled 2019-03-29 (×3): qty 1

## 2019-03-29 MED ORDER — DEXTROSE 5 % IV SOLN: INTRAVENOUS | Status: DC

## 2019-03-29 MED ORDER — DOCUSATE SODIUM 100 MG PO CAPS
100.0000 mg | ORAL_CAPSULE | Freq: Two times a day (BID) | ORAL | Status: DC
  Administered 2019-03-29 – 2019-04-01 (×4): 100 mg via ORAL
  Filled 2019-03-29 (×5): qty 1

## 2019-03-29 NOTE — Progress Notes (Addendum)
PROGRESS NOTE                                                                                                                                                                                                             Patient Demographics:    Marcus Weaver, is a 86 y.o. male, DOB - 04/13/1930, OHY:[id number]  Outpatient Primary MD for the patient is [name], MD    LOS - 7  Admit date - 03/22/2019    Chief Complaint  Patient presents with  . Hyperglycemia       Brief Narrative - Marcus Weaver is a 86 y.o. male with medical history significant of abnormal EKG, CKD, type 2 diabetes, hyperlipidemia, hypertension, hypertensive heart disease, tachycardia, history of UTI, dementia who was transferred from [facility] after presenting there with a history of hyperglycemia over 500 mg/dL for a week according to the patient's daughter.  He was found to be in DKA along with COVID-19 pneumonia and sent to [facility].  His main issue here has been encephalopathy.   Subjective:   Patient in bed visibly confused unable to answer questions reliably or follow commands, appears comfortable.   Assessment  & Plan :     Addendum.  Discussed with patient's daughter.  If any further decline family agreeable for comfort measures if needed but please contact them before initiating morphine or any drips.  He remains DNR.  Continue gentle medical treatment for now.    1.  Acute Covid 19 Viral Pneumonitis during the ongoing 2020 Covid 19 Pandemic - he so far seems to have mild pulmonary disease, on IV steroids and remdesivir, currently not hypoxic will monitor closely.  He is actually currently on room air and not on 1 L.  Encouraged the patient to sit up in chair in the daytime use I-S and flutter valve for pulmonary toiletry and then prone in bed when at night.  SpO2: 100 % O2 Flow Rate (L/min): 1 L/min  Recent Labs  Lab 03/22/19 2238  03/23/19 2255 03/23/19 2255 03/24/19 0621 03/25/19 0422 03/26/19 0650 03/27/19 0944 03/28/19 0415 03/29/19 0058  CRP  --  18.5*   < > 15.7* 10.7* 7.0* 8.8* 15.6* 18.3*  DDIMER  --  6.83*   < > 6.31* 8.63* 7.69* 6.52* 3.26* 3.11*  FERRITIN  --  787*  --  842*  --   --   --   --   --   BNP  --  68.8  --   --  85.2 104.6* 136.1* 125.0* 91.5  PROCALCITON  --  0.99  --   --   --   --   --   --   --   SARSCOV2NAA POSITIVE*  --   --   --   --   --   --   --   --    < > = values in this interval not displayed.    Hepatic Function Latest Ref Rng & Units 03/29/2019 03/28/2019 03/27/2019  Total Protein 6.5 - 8.1 g/dL 6.4(L) 6.8 7.0  Albumin 3.5 - 5.0 g/dL 2.4(L) 2.6(L) 2.7(L)  AST 15 - 41 U/L 29 37 46(H)  ALT 0 - 44 U/L '22 24 25  '$ Alk Phosphatase 38 - 126 U/L 50 51 50  Total Bilirubin 0.3 - 1.2 mg/dL 0.9 0.6 0.8  Bilirubin, Direct 0.0 - 0.3 mg/dL - - -    2.  DKA with wide anion gap metabolic acidosis in a patient with DM type II on insulin.  He has poor outpatient control due to hyperglycemia A1c of 10.9, he was treated with DKA protocol requiring IV insulin and IV fluids, DKA has now resolved has been transitioned to SQ insulin, monitor loosely.  Dose lowered as oral intake is tenuous.  Nursing staff informed.  Lab Results  Component Value Date   HGBA1C 10.9 (H) 03/25/2019   CBG (last 3)  Recent Labs    03/29/19 0052 03/29/19 0415 03/29/19 0725  GLUCAP 149* 193* 241*    3.  AKI on 5.  Baseline creatinine close to 3.5, continue hydration, hold nephrotoxins, hold diuretics and ACE.  Renal ultrasound nonacute, had a Foley catheter which was removed successfully on 03/26/2019, post void bladder scan stable, renal function currently stable, since oral intake is tenuous will continue him on D5W, also has developed hypernatremia which is improving monitor.  4.  Essential hypertension.  Coreg held due to some resting bradycardia, currently on Norvasc will add hydralazine as well.  5.  Metabolic  encephalopathy.  CT head negative, no focal deficits, severely agitated, as needed Haldol, scheduled Seroquel, restraints as needed.  Family updated again on 03/27/2019, daughter understood his poor prognosis and poor outlook.  His severe encephalopathy is preventing him from eating or drinking anything for several days, negative care has already been consulted on 03/28/2019, await input, I also left a detailed message for patient's daughter on 03/29/2019 indicating acute eating drinking, remains encephalopathic and that we should transition to comfort measures.  6.  Poor IV access.  PICC line placed on 03/24/2019 unfortunately patient got delirious and pulled it out on 03/27/2019.     Condition - Fair  Family Communication  :  Daughter Bee - on 03/24/2019 at 10:40 AM, 03/27/2019 understands the poor prognosis.  Detailed message left on 03/28/2018 @   10:15 AM.  Talked to Dr. Raelyn Ensign again on 03/29/2019 at 5:30 PM along with her brother in detail.  Code Status : DNR  Diet :   Diet Order            DIET SOFT Room service appropriate? Yes; Fluid consistency: Thin  Diet effective now               Disposition Plan  : To be decide  Consults  : Palliative care for goals of care  Procedures  :  PICC line and Foley placed this admission.    CT head.  Nonacute  PUD Prophylaxis :    DVT Prophylaxis  : Heparin   Lab Results  Component Value Date   PLT 254 03/29/2019    Inpatient Medications  Scheduled Meds: . amLODipine  10 mg Oral Daily  . vitamin C  500 mg Oral Daily  . carvedilol  12.5 mg Oral BID  . Chlorhexidine Gluconate Cloth  6 each Topical Daily  . feeding supplement (NEPRO CARB STEADY)  237 mL Oral BID BM  . heparin injection (subcutaneous)  5,000 Units Subcutaneous Q8H  . hydrALAZINE  100 mg Oral Q8H  . insulin aspart  0-15 Units Subcutaneous Q4H  . insulin glargine  12 Units Subcutaneous Daily  . Ipratropium-Albuterol  2 puff Inhalation Q6H  . QUEtiapine  50 mg Oral BID  . zinc  sulfate  220 mg Oral Daily   Continuous Infusions: . dextrose 100 mL/hr at 03/29/19 0807   PRN Meds:.acetaminophen, acetaminophen **OR** [DISCONTINUED] acetaminophen, dextrose, guaiFENesin-dextromethorphan, haloperidol lactate, [DISCONTINUED] ondansetron **OR** ondansetron (ZOFRAN) IV  Antibiotics  :    Anti-infectives (From admission, onward)   Start     Dose/Rate Route Frequency Ordered Stop   03/24/19 1000  remdesivir 100 mg in sodium chloride 0.9 % 100 mL IVPB     100 mg 200 mL/hr over 30 Minutes Intravenous Daily 03/23/19 0522 03/27/19 0940   03/23/19 0600  remdesivir 200 mg in sodium chloride 0.9% 250 mL IVPB     200 mg 580 mL/hr over 30 Minutes Intravenous Once 03/23/19 0522 03/23/19 1235   03/22/19 2230  cefTRIAXone (ROCEPHIN) 1 g in sodium chloride 0.9 % 100 mL IVPB     1 g 200 mL/hr over 30 Minutes Intravenous  Once 03/22/19 2218 03/22/19 2301       Time Spent in minutes  30   Lala Lund M.D on 03/29/2019 at 10:15 AM  To page go to [email or web address] - password [facility]  [facility] -  Office  [phone]  See all Orders from today for further details    Objective:   Vitals:   03/29/19 0048 03/29/19 0422 03/29/19 0500 03/29/19 0944  BP: 139/72 (!) 147/54  (!) 162/83  Pulse: 99 79    Resp: 20 20    Temp: 98.3 F (36.8 C) 97.7 F (36.5 C)    TempSrc: Axillary Axillary    SpO2: 100% 100%    Weight:   88.4 kg   Height:        Wt Readings from Last 3 Encounters:  03/29/19 88.4 kg  03/19/19 95.7 kg  02/02/19 95.8 kg     Intake/Output Summary (Last 24 hours) at 03/29/2019 1015 Last data filed at 03/29/2019 0955 Gross per 24 hour  Intake 250 ml  Output 725 ml  Net -475 ml     Physical Exam  Awake but  confused, no focal deficits  [place].AT,PERRAL Supple Neck,No JVD, No cervical lymphadenopathy appriciated.  Symmetrical Chest wall movement, Good air movement bilaterally, CTAB RRR,No Gallops, Rubs or new Murmurs, No Parasternal Heave +ve B.Sounds,  Abd Soft, No tenderness, No organomegaly appriciated, No rebound - guarding or rigidity. No Cyanosis, Clubbing or edema, No new Rash or bruise     Data Review:    CBC Recent Labs  Lab 03/25/19 0422 03/26/19 0650 03/27/19 0944 03/28/19 0415 03/29/19 0058  WBC 10.1 12.3* 11.0* 8.6 8.6  HGB 10.0* 10.4* 11.1* 10.9* 10.2*  HCT 31.0* 31.9* 34.4* 33.4* 31.2*  PLT  246 259 286 258 254  MCV 88.3 87.4 87.8 89.3 89.4  MCH 28.5 28.5 28.3 29.1 29.2  MCHC 32.3 32.6 32.3 32.6 32.7  RDW 13.8 14.0 14.3 14.6 14.6  LYMPHSABS 0.5* 0.6* 0.8 0.6* 0.8  MONOABS 0.5 0.8 1.0 0.9 0.9  EOSABS 0.0 0.0 0.0 0.0 0.0  BASOSABS 0.0 0.0 0.0 0.0 0.0    Chemistries  Recent Labs  Lab 03/25/19 0422 03/26/19 0650 03/27/19 0944 03/28/19 0415 03/29/19 0058  NA 147* 144 154* 150* 149*  K 3.7 3.5 4.2 4.6 4.3  CL 111 111 123* 118* 117*  CO2 21* 19* 20* 17* 19*  GLUCOSE 100* 112* 65* 355* 154*  BUN 125* 121* 108* 103* 93*  CREATININE 4.52* 4.15* 3.94* 3.74* 3.80*  CALCIUM 8.1* 8.0* 8.6* 8.2* 8.3*  MG 2.2 2.1 2.3 2.2 2.3  AST 23 25 46* 37 29  ALT '14 16 25 24 22  '$ ALKPHOS 38 41 50 51 50  BILITOT 0.5 0.6 0.8 0.6 0.9   ------------------------------------------------------------------------------------------------------------------ No results for input(s): CHOL, HDL, LDLCALC, TRIG, CHOLHDL, LDLDIRECT in the last 72 hours.  Lab Results  Component Value Date   HGBA1C 10.9 (H) 03/25/2019   ------------------------------------------------------------------------------------------------------------------ No results for input(s): TSH, T4TOTAL, T3FREE, THYROIDAB in the last 72 hours.  Invalid input(s): FREET3  Cardiac Enzymes No results for input(s): CKMB, TROPONINI, MYOGLOBIN in the last 168 hours.  Invalid input(s): CK ------------------------------------------------------------------------------------------------------------------    Component Value Date/Time   BNP 91.5 03/29/2019 0058    Micro  Results Recent Results (from the past 240 hour(s))  Urine culture     Status: None   Collection Time: 03/22/19  9:30 PM   Specimen: Urine, Catheterized  Result Value Ref Range Status   Specimen Description   Final    URINE, CATHETERIZED Performed at [facility], [address] [place], [place] [address]    Special Requests   Final    NONE Performed at [facility], [address] [place], [place] [address]    Culture   Final    NO GROWTH Performed at [facility], [place] [address] [place], [place] [address]    Report Status 03/24/2019 FINAL  Final  SARS CORONAVIRUS 2 (TAT 6-24 HRS) Nasopharyngeal Nasopharyngeal Swab     Status: Abnormal   Collection Time: 03/22/19 10:38 PM   Specimen: Nasopharyngeal Swab  Result Value Ref Range Status   SARS Coronavirus 2 POSITIVE (A) NEGATIVE Final    Comment: RESULT CALLED TO, READ BACK BY AND VERIFIED WITH: S.GOUGE,RN [id number] I.MANNING (NOTE) SARS-CoV-2 target nucleic acids are DETECTED. The SARS-CoV-2 RNA is generally detectable in upper and lower respiratory specimens during the acute phase of infection. Positive results are indicative of the presence of SARS-CoV-2 RNA. Clinical correlation with patient history and other diagnostic information is  necessary to determine patient infection status. Positive results do not rule out bacterial infection or co-infection with other viruses.  The expected result is Negative. Fact Sheet for Patients: SugarRoll.be Fact Sheet for Healthcare Providers: https://www.woods-mathews.com/ This test is not yet approved or cleared by the [place] FDA and  has been authorized for detection and/or diagnosis of SARS-CoV-2 by FDA under an Emergency Use Authorization (EUA). This EUA will remain  in effect (meaning this test can be used) for the  duration of the COVID-19 declaration under Section 564(b)(1) of the Act, 21  U.S.C. section 360bbb-3(b)(1), unless the authorization is terminated or revoked sooner. Performed at [facility], [address] [place], [place] [address]   SARS Coronavirus 2 Ag (30 min TAT) - Nasal Swab (BD Veritor Kit)     Status: Abnormal   Collection Time: 03/22/19 11:39 PM   Specimen: Nasal Swab (BD Veritor Kit)  Result Value Ref Range Status   SARS Coronavirus 2 Ag POSITIVE (A) NEGATIVE Final    Comment: RESULT CALLED TO, READ BACK BY AND VERIFIED WITH: MAYNARD,C AT 0007 ON [id number] BY CHERESNOWSKY,T (NOTE) SARS-CoV-2 antigen PRESENT. Positive results indicate the presence of viral antigens, but clinical correlation with patient history and other diagnostic information is necessary to determine patient infection status.  Positive results do not rule out bacterial infection or co-infection  with other viruses. False positive results are rare but can occur, and confirmatory RT-PCR testing may be appropriate in some circumstances. The expected result is Negative. Fact Sheet for Patients: PodPark.tn Fact Sheet for Providers: GiftContent.is  This test is not yet approved or cleared by the [place] FDA and  has been authorized for detection and/or diagnosis of SARS-CoV-2 by FDA under an Emergency Use Authorization (EUA).  This EUA will remain in effect (meaning this test can be used) for the duration of  the COVID- 19 declaration under Section 564(b)(1) of the Act, 21 U.S.C. section 360bbb-3(b)(1), unless the authorization is terminated or revoked sooner. Performed at [facility], [address] [place], [place] [address]     Radiology Reports CT Head Wo Contrast  Result Date: 03/22/2019 CLINICAL DATA:  Altered mental status EXAM: CT HEAD WITHOUT CONTRAST TECHNIQUE: Contiguous axial images were obtained from the base of the skull through the vertex without intravenous contrast. COMPARISON:  CT  brain 05/15/2015 FINDINGS: Brain: No acute territorial infarction, hemorrhage, or intracranial mass is visualized. Moderate atrophy, slightly progressed since 2017. Moderate hypodensity in the white matter, likely chronic small vessel ischemic change. Moderate enlargement of the ventricles, slightly progressed since 2017. Vascular: No hyperdense vessels.  Carotid vascular calcification Skull: Normal. Negative for fracture or focal lesion. Sinuses/Orbits: Mild mucosal thickening in the sinuses. Old fractures of the medial walls of both orbits Other: None IMPRESSION: 1. No definite CT evidence for acute intracranial abnormality. 2. Moderate ventricular enlargement, slightly increased since 2017, likely related to progression of atrophy. 3. Moderate hypodensity in the white matter consistent with chronic small vessel ischemic change Electronically Signed   By: Donavan Foil M.D.   On: 03/22/2019 19:48   US RENAL  Result Date: 03/27/2019 CLINICAL DATA:  Acute kidney injury, hypertension, type II diabetes mellitus EXAM: RENAL / URINARY TRACT ULTRASOUND COMPLETE COMPARISON:  05/15/2015 FINDINGS: Right Kidney: Renal measurements: 9.5 x 5.0 x 5.1 cm = volume: 125 mL. Mild cortical thinning. Increased cortical echogenicity. No mass, hydronephrosis or shadowing calcification. Left Kidney: Renal measurements: 9.7 x 5.4 x 5.2 cm = volume: 144 mL. Mild cortical thinning. Increased cortical echogenicity. No mass, hydronephrosis or shadowing calcification. Bladder: Decompressed by Foley catheter, unable to evaluate Other: N/A IMPRESSION: Medical renal disease changes of both kidneys without evidence of renal mass or hydronephrosis. Electronically Signed   By: Lavonia Dana M.D.   On: 03/27/2019 15:05   DG Chest Port 1 View  Result Date: 03/23/2019 CLINICAL DATA:  Worsening dyspnea.  COVID-19 positive. EXAM: PORTABLE CHEST 1 VIEW COMPARISON:  Radiograph 3 hours ago. FINDINGS: Unchanged heart size and mediastinal contours.  Aortic atherosclerosis. Minimal vague airspace opacity in the mid lower right lung zone. Mild central bronchial thickening. No pulmonary edema, pleural effusion or  pneumothorax. Stable osseous structures. IMPRESSION: 1. Developing opacities in the right mid lower lung zone, likely related to COVID-19 pneumonia. 2. Central bronchial thickening. 3.  Aortic Atherosclerosis (ICD10-I70.0). Electronically Signed   By: Keith Rake M.D.   On: 03/23/2019 04:42   DG Chest Portable 1 View  Result Date: 03/23/2019 CLINICAL DATA:  Initial evaluation for acute altered mental status. EXAM: PORTABLE CHEST 1 VIEW COMPARISON:  Prior radiograph from 07/29/2009. FINDINGS: The cardiac and mediastinal silhouettes are stable in size and contour, and remain within normal limits. Aortic atherosclerosis. The lungs are normally inflated. No airspace consolidation, pleural effusion, or pulmonary edema. No pneumothorax. No acute osseous abnormality. IMPRESSION: 1. No radiographic evidence for active cardiopulmonary disease. 2.  Aortic Atherosclerosis (ICD10-I70.0). Electronically Signed   By: Jeannine Boga M.D.   On: 03/23/2019 00:49   [place] EKG SITE RITE  Result Date: 03/24/2019 If Site Rite image not attached, placement could not be confirmed due to current cardiac rhythm.

## 2019-03-29 NOTE — Progress Notes (Signed)
Daughter Bee called and updated on patient condition and course of treatment, all questions answered. She stated that when the time for discharge comes she would prefer [facility] for SNF placement.

## 2019-03-29 NOTE — Plan of Care (Signed)
Patient disorientedx4, difficult to redirect. Remains on RA vss normotensive when not agitated. No BM today, orders received and medicated. Condom cath for urinary incontinence. Patient restless in bed, condom cath periodially self removed. Eating a few bites of meals with much assistance however pocketing and slow to initiate swallow. No evidence of gross aspiration. Took most medications this shift though did spit out a few of them this afternoon.   Problem: Education: Goal: Knowledge of risk factors and measures for prevention of condition will improve Outcome: Not Progressing   Problem: Respiratory: Goal: Will maintain a patent airway Outcome: Adequate for Discharge Goal: Complications related to the disease process, condition or treatment will be avoided or minimized Outcome: Adequate for Discharge

## 2019-03-29 NOTE — Plan of Care (Signed)
  Problem: Respiratory: Goal: Will maintain a patent airway Outcome: Progressing Goal: Complications related to the disease process, condition or treatment will be avoided or minimized Outcome: Progressing   Problem: Education: Goal: Knowledge of risk factors and measures for prevention of condition will improve Outcome: Not Progressing  Pt demented

## 2019-03-30 DIAGNOSIS — Z515 Encounter for palliative care: Secondary | ICD-10-CM

## 2019-03-30 DIAGNOSIS — J1282 Pneumonia due to coronavirus disease 2019: Secondary | ICD-10-CM

## 2019-03-30 DIAGNOSIS — G9340 Encephalopathy, unspecified: Secondary | ICD-10-CM

## 2019-03-30 DIAGNOSIS — N179 Acute kidney failure, unspecified: Secondary | ICD-10-CM

## 2019-03-30 DIAGNOSIS — U071 COVID-19: Principal | ICD-10-CM

## 2019-03-30 LAB — BRAIN NATRIURETIC PEPTIDE: B Natriuretic Peptide: 78.6 pg/mL (ref 0.0–100.0)

## 2019-03-30 LAB — CBC WITH DIFFERENTIAL/PLATELET
Abs Immature Granulocytes: 0.09 10*3/uL — ABNORMAL HIGH (ref 0.00–0.07)
Basophils Absolute: 0 10*3/uL (ref 0.0–0.1)
Basophils Relative: 0 %
Eosinophils Absolute: 0 10*3/uL (ref 0.0–0.5)
Eosinophils Relative: 0 %
HCT: 30.7 % — ABNORMAL LOW (ref 39.0–52.0)
Hemoglobin: 10.1 g/dL — ABNORMAL LOW (ref 13.0–17.0)
Immature Granulocytes: 1 %
Lymphocytes Relative: 6 %
Lymphs Abs: 0.5 10*3/uL — ABNORMAL LOW (ref 0.7–4.0)
MCH: 29.6 pg (ref 26.0–34.0)
MCHC: 32.9 g/dL (ref 30.0–36.0)
MCV: 90 fL (ref 80.0–100.0)
Monocytes Absolute: 0.8 10*3/uL (ref 0.1–1.0)
Monocytes Relative: 9 %
Neutro Abs: 7.5 10*3/uL (ref 1.7–7.7)
Neutrophils Relative %: 84 %
Platelets: 244 10*3/uL (ref 150–400)
RBC: 3.41 MIL/uL — ABNORMAL LOW (ref 4.22–5.81)
RDW: 14.8 % (ref 11.5–15.5)
WBC: 8.9 10*3/uL (ref 4.0–10.5)
nRBC: 0 % (ref 0.0–0.2)

## 2019-03-30 LAB — COMPREHENSIVE METABOLIC PANEL
ALT: 24 U/L (ref 0–44)
AST: 26 U/L (ref 15–41)
Albumin: 2.4 g/dL — ABNORMAL LOW (ref 3.5–5.0)
Alkaline Phosphatase: 52 U/L (ref 38–126)
Anion gap: 12 (ref 5–15)
BUN: 91 mg/dL — ABNORMAL HIGH (ref 8–23)
CO2: 18 mmol/L — ABNORMAL LOW (ref 22–32)
Calcium: 8.4 mg/dL — ABNORMAL LOW (ref 8.9–10.3)
Chloride: 120 mmol/L — ABNORMAL HIGH (ref 98–111)
Creatinine, Ser: 3.78 mg/dL — ABNORMAL HIGH (ref 0.61–1.24)
GFR calc Af Amer: 16 mL/min — ABNORMAL LOW (ref >60)
GFR calc non Af Amer: 14 mL/min — ABNORMAL LOW (ref >60)
Glucose, Bld: 152 mg/dL — ABNORMAL HIGH (ref 70–99)
Potassium: 4.3 mmol/L (ref 3.5–5.1)
Sodium: 150 mmol/L — ABNORMAL HIGH (ref 135–145)
Total Bilirubin: 0.7 mg/dL (ref 0.3–1.2)
Total Protein: 6.4 g/dL — ABNORMAL LOW (ref 6.5–8.1)

## 2019-03-30 LAB — GLUCOSE, CAPILLARY
Glucose-Capillary: 122 mg/dL — ABNORMAL HIGH (ref 70–99)
Glucose-Capillary: 140 mg/dL — ABNORMAL HIGH (ref 70–99)
Glucose-Capillary: 147 mg/dL — ABNORMAL HIGH (ref 70–99)
Glucose-Capillary: 162 mg/dL — ABNORMAL HIGH (ref 70–99)
Glucose-Capillary: 218 mg/dL — ABNORMAL HIGH (ref 70–99)
Glucose-Capillary: 276 mg/dL — ABNORMAL HIGH (ref 70–99)
Glucose-Capillary: 86 mg/dL (ref 70–99)

## 2019-03-30 LAB — MAGNESIUM: Magnesium: 2.5 mg/dL — ABNORMAL HIGH (ref 1.7–2.4)

## 2019-03-30 LAB — C-REACTIVE PROTEIN: CRP: 14.6 mg/dL — ABNORMAL HIGH (ref <1.0)

## 2019-03-30 LAB — D-DIMER, QUANTITATIVE: D-Dimer, Quant: 3.1 ug/mL-FEU — ABNORMAL HIGH (ref 0.00–0.50)

## 2019-03-30 MED ORDER — DEXTROSE 5 % IV SOLN: INTRAVENOUS | Status: AC

## 2019-03-30 NOTE — Consult Note (Signed)
Consultation Note Date: 03/30/2019   Patient Name: Marcus Weaver  DOB: [birth year]  MRN: [id number]  Age / Sex: 86 y.o., male  PCP: [name], MD Referring Physician: Thurnell Lose, MD  Reason for Consultation: Establishing goals of care and Psychosocial/spiritual support  HPI/Patient Profile: 86 y.o. male with past medical history of DM2, CKD 4, hypertensive heart disease who was admitted on 03/22/2019 with COVID pneumonia and acute on chronic kidney failure.  His respiratory status seems to be stable despite COVID pneumonia and his creatinine is returning to baseline, but he has suffered with persistent refractory encephalopathy since admission.  He is refusing most all p.o. intake and has been very agitated attempting to get out of bed and remove his IV.    Assessment and Goals of Care:  I have reviewed medical records including EPIC notes, labs and imaging, received report from Dr Candiss Norse and the RN team of Lenna Sciara and Saralyn Pilar, and then spoke with his daughter Thor Brenn to discuss diagnosis prognosis, [place], EOL wishes, disposition and options.  I introduced Palliative Medicine as specialized medical care for people living with serious illness. It focuses on providing relief from the symptoms and stress of a serious illness. The goal is to improve quality of life for both the patient and the family.  We discussed a brief life review of the patient.  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 He lives at home with his daughter Tomi Bamberger.  His wife died approximately 10 years ago and they cared for her at home until she died.    Mr. [name] was independent with ADLs, able to walk without assistance, and eating well until the week of Christmas when his family noticed a slight decline.  As New Year's Day got closer the patient developed symptoms that his daughter thought were  related to a urinary tract infection.  She took him to the primary care physician who recommended he go to the ER.  They waited in the ER for over 12 hours and then left.  His daughter feels that is when he was exposed to Covid.  Soon after that she took him to [facility] ER where he was tested and found to be Covid positive.  Tomi Bamberger tells me that Mr. Castles oldest brother who was 58, passed away in January 20, 2023.  The patient has been grieving for his brother and Tomi Bamberger feels this is weighing heavily on him.  In addition she explains that the patient's first hospitalization did not happen until he was 86 years old.  He dislikes the hospital very much and did not want to come.  In each of his 4 lifetime hospitalizations he has become encephalopathic and agitated much like we are describing now.  I spoke with Tomi Bamberger about her options including: Continue aggressive care which may require an NG tube and possibly restraints; go home with the support of hospice; hospice house.  After some discussion Tomi Bamberger felt the best option would be to take him home with  hospice services.  She feels he would be more comfortable in his own environment with his family and he may even respond better.  She would like to take today to talk to her employer and ask for time off as well as to talk with her family to see if she can garner enough support to care for him in the home.  Ms Penninger seems to have a realistic understanding of how her father is doing medically.  We agreed to speak again either later today or tomorrow to determine next steps  Questions and concerns were addressed.   The family was encouraged to call with questions or concerns.    Primary Decision Maker:  NEXT OF KIN    SUMMARY OF RECOMMENDATIONS    Continue all current care.   Family is working to take him home with hospice if possible, they understand he will likely continue to decline but they remain hopeful that he will improve in his own environment.     Code Status/Advance Care Planning:  DNR   Symptom Management:   Continue current care  Additional Recommendations (Limitations, Scope, Preferences):  Full Scope Treatment  Palliative Prophylaxis:   Delirium Protocol  Psycho-social/Spiritual:   Desire for further Chaplaincy support:  Not discussed.  Prognosis:  Patient is not current taking in enough nutrition and hydration to sustain himself.  If his PO intake does not improve he likely has weeks.    Discharge Planning: Home with Hospice - if family has resources to handle him at home.      Primary Diagnoses: Present on Admission: . Acute encephalopathy . Pneumonia due to COVID-19 virus . Essential hypertension . Hyperosmolar non-ketotic state due to type 2 diabetes mellitus ([place]) . Acute kidney injury ([place])   I have reviewed the medical record, interviewed the patient and family, and examined the patient. The following aspects are pertinent.  Past Medical History:  Diagnosis Date  . Abnormal EKG 08/14/2015  . CKD (chronic kidney disease), stage II   . Diabetes mellitus    type2  . Hyperlipidemia   . Hypertension   . Hypertensive heart disease 08/14/2015  . Tachycardia    a. HR 132 at endo OV 06/2015, no EKG at that time.  Marland Kitchen UTI (lower urinary tract infection)    Social History   Socioeconomic History  . Marital status: Single    Spouse name: Not on file  . Number of children: Not on file  . Years of education: Not on file  . Highest education level: Not on file  Occupational History  . Occupation: retired  Tobacco Use  . Smoking status: Former Smoker    Types: Cigarettes    Quit date: 06/25/2015    Years since quitting: 3.7  . Smokeless tobacco: Never Used  . Tobacco comment: smokes 3 or 4 cigarettes /day  Substance and Sexual Activity  . Alcohol use: No  . Drug use: No  . Sexual activity: Not on file  Other Topics Concern  . Not on file  Social History Narrative  . Not on file   Social  Determinants of Health   Financial Resource Strain:   . Difficulty of Paying Living Expenses: Not on file  Food Insecurity:   . Worried About Charity fundraiser in the Last Year: Not on file  . Ran Out of Food in the Last Year: Not on file  Transportation Needs:   . Lack of Transportation (Medical): Not on file  . Lack of Transportation (Non-Medical):  Not on file  Physical Activity:   . Days of Exercise per Week: Not on file  . Minutes of Exercise per Session: Not on file  Stress:   . Feeling of Stress : Not on file  Social Connections:   . Frequency of Communication with Friends and Family: Not on file  . Frequency of Social Gatherings with Friends and Family: Not on file  . Attends Religious Services: Not on file  . Active Member of Clubs or Organizations: Not on file  . Attends Archivist Meetings: Not on file  . Marital Status: Not on file   Family History  Problem Relation Age of Onset  . Diabetes Other        1st degree relative  . Hypertension Other   . Kidney disease Mother   . Cancer Neg Hx    Scheduled Meds: . amLODipine  10 mg Oral Daily  . vitamin C  500 mg Oral Daily  . bisacodyl  10 mg Rectal Daily  . carvedilol  12.5 mg Oral BID  . docusate sodium  100 mg Oral BID  . feeding supplement (NEPRO CARB STEADY)  237 mL Oral BID BM  . heparin injection (subcutaneous)  5,000 Units Subcutaneous Q8H  . hydrALAZINE  100 mg Oral Q8H  . insulin aspart  0-15 Units Subcutaneous Q4H  . insulin glargine  12 Units Subcutaneous Daily  . Ipratropium-Albuterol  2 puff Inhalation Q6H  . QUEtiapine  50 mg Oral BID  . zinc sulfate  220 mg Oral Daily   Continuous Infusions: . dextrose 125 mL/hr at 03/30/19 0738   PRN Meds:.acetaminophen, acetaminophen **OR** [DISCONTINUED] acetaminophen, dextrose, guaiFENesin-dextromethorphan, haloperidol lactate, [DISCONTINUED] ondansetron **OR** ondansetron (ZOFRAN) IV No Known Allergies   Vital Signs: BP (!) 152/69 (BP  Location: Right Arm)   Pulse 95   Temp (!) 97.5 F (36.4 C) (Oral)   Resp (!) 22   Ht 5\' 10"  (1.778 m)   Wt 88.8 kg   SpO2 95%   BMI 28.09 kg/m  Pain Scale: PAINAD   Pain Score: 0-No pain   SpO2: SpO2: 95 % O2 Device:SpO2: 95 % O2 Flow Rate: .O2 Flow Rate (L/min): 0 L/min  IO: Intake/output summary:   Intake/Output Summary (Last 24 hours) at 03/30/2019 0900 Last data filed at 03/30/2019 [id number] Gross per 24 hour  Intake 2978.52 ml  Output 1050 ml  Net 1928.52 ml    LBM: Last BM Date: 03/30/19 Baseline Weight: Weight: 95 kg Most recent weight: Weight: 88.8 kg     Palliative Assessment/Data: 20%     Time In: 9:00 Time Out: 10:04 Time Total: 64 min Visit consisted of counseling and education dealing with the complex and emotionally intense issues surrounding the need for palliative care and symptom management in the setting of serious and potentially life-threatening illness. Greater than 50%  of this time was spent counseling and coordinating care related to the above assessment and plan.  The above conversation was completed via telephone due to the restrictions during the COVID-19 pandemic. Thorough chart review and discussion with necessary members of the care team was completed as part of assessment. All issues were discussed and addressed but no physical exam was performed.    Signed by: Florentina Jenny, PA-C Palliative Medicine Pager: [phone]  Please contact Palliative Medicine Team phone at [phone] for questions and concerns.  For individual provider: See Amion  Time 64 min.

## 2019-03-30 NOTE — Progress Notes (Signed)
Nutrition Follow-up Brief Note  Chart reviewed. Palliative Care Team is following.  Patient's daughter would like to take patient home with Hospice services. Potential D/C home tomorrow.  Intake is minimal. Feeding tube not desired at this time.  No further nutrition interventions warranted at this time.  Please re-consult as needed.   Molli Barrows, RD, LDN, [place] Pager [phone] After Hours Pager [phone]

## 2019-03-30 NOTE — Progress Notes (Signed)
PROGRESS NOTE                                                                                                                                                                                                             Patient Demographics:    Marcus Weaver, is a 86 y.o. male, DOB - 12-20-1930, HDQ:[id number]  Outpatient Primary MD for the patient is [name], MD    LOS - 8  Admit date - 03/22/2019    Chief Complaint  Patient presents with  . Hyperglycemia       Brief Narrative - Marcus Weaver is a 86 y.o. male with medical history significant of abnormal EKG, CKD, type 2 diabetes, hyperlipidemia, hypertension, hypertensive heart disease, tachycardia, history of UTI, dementia who was transferred from [facility] after presenting there with a history of hyperglycemia over 500 mg/dL for a week according to the patient's daughter.  He was found to be in DKA along with COVID-19 pneumonia and sent to [facility].  His main issue here has been encephalopathy.   Subjective:   Patient in bed visibly confused unable to answer questions reliably or follow commands, appears comfortable.   Assessment  & Plan :    1.  Acute Covid 19 Viral Pneumonitis during the ongoing 2020 Covid 19 Pandemic - he so far seems to have mild pulmonary disease, on IV steroids and remdesivir, currently not hypoxic will monitor closely.  He is actually currently on room air .  Encouraged the patient to sit up in chair in the daytime use I-S and flutter valve for pulmonary toiletry and then prone in bed when at night.  SpO2: 93 % O2 Flow Rate (L/min): 0 L/min  Recent Labs  Lab 03/23/19 2255 03/23/19 2255 03/24/19 0621 03/26/19 0650 03/27/19 0944 03/28/19 0415 03/29/19 0058 03/30/19 0140  CRP 18.5*  --  15.7* 7.0* 8.8* 15.6* 18.3* 14.6*  DDIMER 6.83*  --  6.31* 7.69* 6.52* 3.26* 3.11* 3.10*  FERRITIN 787*  --  842*  --   --   --   --   --   BNP 68.8   <  >  --  104.6* 136.1* 125.0* 91.5 78.6  PROCALCITON 0.99  --   --   --   --   --   --   --    < > =  values in this interval not displayed.    Hepatic Function Latest Ref Rng & Units 03/30/2019 03/29/2019 03/28/2019  Total Protein 6.5 - 8.1 g/dL 6.4(L) 6.4(L) 6.8  Albumin 3.5 - 5.0 g/dL 2.4(L) 2.4(L) 2.6(L)  AST 15 - 41 U/L 26 29 37  ALT 0 - 44 U/L _0 Alk Phosphatase 38 - 126 U/L 52 50 51  Total Bilirubin 0.3 - 1.2 mg/dL 0.7 0.9 0.6  Bilirubin, Direct 0.0 - 0.3 mg/dL - - -    2.  DKA with wide anion gap metabolic acidosis in a patient with DM type II on insulin.  He has poor outpatient control due to hyperglycemia A1c of 10.9, he was treated with DKA protocol requiring IV insulin and IV fluids, DKA has now resolved has been transitioned to SQ insulin, monitor loosely.  Dose lowered as oral intake is tenuous.  Nursing staff informed.  Lab Results  Component Value Date   HGBA1C 10.9 (H) 03/25/2019   CBG (last 3)  Recent Labs    03/29/19 2359 03/30/19 0433 03/30/19 0756  GLUCAP 147* 218* 140*    3.  AKI on 5.  Baseline creatinine close to 3.5, continue hydration, hold nephrotoxins, hold diuretics and ACE.  Renal ultrasound nonacute, had a Foley catheter which was removed successfully on 03/26/2019, post void bladder scan stable, renal function currently stable, since oral intake is tenuous will continue him on D5W, also has developed hypernatremia which is improving monitor.  4.  Essential hypertension.  Coreg held due to some resting bradycardia, currently on Norvasc will add hydralazine as well.  5.  Metabolic encephalopathy.  CT head negative, no focal deficits, severely agitated, as needed Haldol, scheduled Seroquel, restraints as needed.  Family updated again on 03/27/2019, daughter understood his poor prognosis and poor outlook.  His severe encephalopathy is preventing him from eating or drinking anything for several days, negative care has already been consulted on 03/28/2019, await  input, I also left a detailed message for patient's daughter on 03/29/2019 indicating acute eating drinking, remains encephalopathic, currently DNR plan is to continue with gentle IV fluids, palliative care also involved family wants to take him home on 03/31/2019.  Goal is to continue gentle medical treatment.  He can go home tomorrow.  6.  Poor IV access.  PICC line placed on 03/24/2019 unfortunately patient got delirious and pulled it out on 03/27/2019.     Condition - Fair  Family Communication  :  Daughter Bee - on 03/24/2019 at 10:40 AM, 03/27/2019 understands the poor prognosis.  Detailed message left on 03/28/2018 @   10:15 AM.  Talked to Dr. Raelyn Ensign again on 03/29/2019 at 5:30 PM along with her brother in detail.  Code Status : DNR  Diet :   Diet Order            DIET SOFT Room service appropriate? Yes; Fluid consistency: Thin  Diet effective now               Disposition Plan  : Likely home on 03/31/2019.  Consults  : Palliative care for goals of care  Procedures  :   PICC line and Foley placed this admission.    CT head.  Nonacute  PUD Prophylaxis :    DVT Prophylaxis  : Heparin   Lab Results  Component Value Date   PLT 244 03/30/2019    Inpatient Medications  Scheduled Meds: . amLODipine  10 mg Oral Daily  . vitamin C  500 mg  Oral Daily  . bisacodyl  10 mg Rectal Daily  . carvedilol  12.5 mg Oral BID  . docusate sodium  100 mg Oral BID  . feeding supplement (NEPRO CARB STEADY)  237 mL Oral BID BM  . heparin injection (subcutaneous)  5,000 Units Subcutaneous Q8H  . hydrALAZINE  100 mg Oral Q8H  . insulin aspart  0-15 Units Subcutaneous Q4H  . insulin glargine  12 Units Subcutaneous Daily  . Ipratropium-Albuterol  2 puff Inhalation Q6H  . QUEtiapine  50 mg Oral BID  . zinc sulfate  220 mg Oral Daily   Continuous Infusions: . dextrose 125 mL/hr at 03/30/19 0738   PRN Meds:.acetaminophen, acetaminophen **OR** [DISCONTINUED] acetaminophen, dextrose,  guaiFENesin-dextromethorphan, haloperidol lactate, [DISCONTINUED] ondansetron **OR** ondansetron (ZOFRAN) IV  Antibiotics  :    Anti-infectives (From admission, onward)   Start     Dose/Rate Route Frequency Ordered Stop   03/24/19 1000  remdesivir 100 mg in sodium chloride 0.9 % 100 mL IVPB     100 mg 200 mL/hr over 30 Minutes Intravenous Daily 03/23/19 0522 03/27/19 0940   03/23/19 0600  remdesivir 200 mg in sodium chloride 0.9% 250 mL IVPB     200 mg 580 mL/hr over 30 Minutes Intravenous Once 03/23/19 0522 03/23/19 1235   03/22/19 2230  cefTRIAXone (ROCEPHIN) 1 g in sodium chloride 0.9 % 100 mL IVPB     1 g 200 mL/hr over 30 Minutes Intravenous  Once 03/22/19 2218 03/22/19 2301       Time Spent in minutes  30   Lala Lund M.D on 03/30/2019 at 10:06 AM  To page go to [email or web address] - password [facility]  [facility] -  Office  [phone]  See all Orders from today for further details    Objective:   Vitals:   03/30/19 0401 03/30/19 0422 03/30/19 0732 03/30/19 0956  BP: (!) 149/85  (!) 152/69 (!) 108/47  Pulse: 65  95 61  Resp: (!) 22  (!) 22 20  Temp: 97.8 F (36.6 C)  (!) 97.5 F (36.4 C)   TempSrc: Axillary  Oral   SpO2: 96%  95% 93%  Weight:  88.8 kg    Height:        Wt Readings from Last 3 Encounters:  03/30/19 88.8 kg  03/19/19 95.7 kg  02/02/19 95.8 kg     Intake/Output Summary (Last 24 hours) at 03/30/2019 1006 Last data filed at 03/30/2019 0842 Gross per 24 hour  Intake 2728.52 ml  Output 1050 ml  Net 1678.52 ml     Physical Exam  Awake but  confused, no focal deficits  [place].AT,PERRAL Supple Neck,No JVD, No cervical lymphadenopathy appriciated.  Symmetrical Chest wall movement, Good air movement bilaterally, CTAB RRR,No Gallops, Rubs or new Murmurs, No Parasternal Heave +ve B.Sounds, Abd Soft, No tenderness, No organomegaly appriciated, No rebound - guarding or rigidity. No Cyanosis, Clubbing or edema, No new Rash or bruise    Data  Review:    CBC Recent Labs  Lab 03/26/19 0650 03/27/19 0944 03/28/19 0415 03/29/19 0058 03/30/19 0140  WBC 12.3* 11.0* 8.6 8.6 8.9  HGB 10.4* 11.1* 10.9* 10.2* 10.1*  HCT 31.9* 34.4* 33.4* 31.2* 30.7*  PLT 259 286 258 254 244  MCV 87.4 87.8 89.3 89.4 90.0  MCH 28.5 28.3 29.1 29.2 29.6  MCHC 32.6 32.3 32.6 32.7 32.9  RDW 14.0 14.3 14.6 14.6 14.8  LYMPHSABS 0.6* 0.8 0.6* 0.8 0.5*  MONOABS 0.8 1.0 0.9 0.9 0.8  EOSABS 0.0 0.0  0.0 0.0 0.0  BASOSABS 0.0 0.0 0.0 0.0 0.0    Chemistries  Recent Labs  Lab 03/26/19 0650 03/27/19 0944 03/28/19 0415 03/29/19 0058 03/30/19 0140  NA 144 154* 150* 149* 150*  K 3.5 4.2 4.6 4.3 4.3  CL 111 123* 118* 117* 120*  CO2 19* 20* 17* 19* 18*  GLUCOSE 112* 65* 355* 154* 152*  BUN 121* 108* 103* 93* 91*  CREATININE 4.15* 3.94* 3.74* 3.80* 3.78*  CALCIUM 8.0* 8.6* 8.2* 8.3* 8.4*  MG 2.1 2.3 2.2 2.3 2.5*  AST 25 46* 37 29 26  ALT _0 ALKPHOS 41 50 51 50 52  BILITOT 0.6 0.8 0.6 0.9 0.7   ------------------------------------------------------------------------------------------------------------------ No results for input(s): CHOL, HDL, LDLCALC, TRIG, CHOLHDL, LDLDIRECT in the last 72 hours.  Lab Results  Component Value Date   HGBA1C 10.9 (H) 03/25/2019   ------------------------------------------------------------------------------------------------------------------ No results for input(s): TSH, T4TOTAL, T3FREE, THYROIDAB in the last 72 hours.  Invalid input(s): FREET3  Cardiac Enzymes No results for input(s): CKMB, TROPONINI, MYOGLOBIN in the last 168 hours.  Invalid input(s): CK ------------------------------------------------------------------------------------------------------------------    Component Value Date/Time   BNP 78.6 03/30/2019 0140    Micro Results Recent Results (from the past 240 hour(s))  Urine culture     Status: None   Collection Time: 03/22/19  9:30 PM   Specimen: Urine, Catheterized    Result Value Ref Range Status   Specimen Description   Final    URINE, CATHETERIZED Performed at [facility], [address] [place], [place] [address]    Special Requests   Final    NONE Performed at [facility], [address] [place], [place] [address]    Culture   Final    NO GROWTH Performed at [facility], [place] [place] [address] [place],  [address]    Report Status 03/24/2019 FINAL  Final  SARS CORONAVIRUS 2 (TAT 6-24 HRS) Nasopharyngeal Nasopharyngeal Swab     Status: Abnormal   Collection Time: 03/22/19 10:38 PM   Specimen: Nasopharyngeal Swab  Result Value Ref Range Status   SARS Coronavirus 2 POSITIVE (A) NEGATIVE Final    Comment: RESULT CALLED TO, READ BACK BY AND VERIFIED WITH: S.GOUGE,RN [id number] I.MANNING (NOTE) SARS-CoV-2 target nucleic acids are DETECTED. The SARS-CoV-2 RNA is generally detectable in upper and lower respiratory specimens during the acute phase of infection. Positive results are indicative of the presence of SARS-CoV-2 RNA. Clinical correlation with patient history and other diagnostic information is  necessary to determine patient infection status. Positive results do not rule out bacterial infection or co-infection with other viruses.  The expected result is Negative. Fact Sheet for Patients: SugarRoll.be Fact Sheet for Healthcare Providers: https://www.woods-mathews.com/ This test is not yet approved or cleared by the [place] FDA and  has been authorized for detection and/or diagnosis of SARS-CoV-2 by FDA under an Emergency Use Authorization (EUA). This EUA will remain  in effect (meaning this test can be used) for the  duration of the COVID-19 declaration under Section 564(b)(1) of the Act, 21 U.S.C. section 360bbb-3(b)(1), unless the authorization is terminated or revoked sooner. Performed at [facility], [place] [address] [place],  [place] [address]   SARS Coronavirus 2 Ag (30 min TAT) - Nasal Swab (BD Veritor Kit)     Status: Abnormal   Collection Time: 03/22/19 11:39 PM   Specimen: Nasal Swab (BD Veritor Kit)  Result Value Ref Range Status  SARS Coronavirus 2 Ag POSITIVE (A) NEGATIVE Final    Comment: RESULT CALLED TO, READ BACK BY AND VERIFIED WITH: MAYNARD,C AT 0007 ON [id number] BY CHERESNOWSKY,T (NOTE) SARS-CoV-2 antigen PRESENT. Positive results indicate the presence of viral antigens, but clinical correlation with patient history and other diagnostic information is necessary to determine patient infection status.  Positive results do not rule out bacterial infection or co-infection  with other viruses. False positive results are rare but can occur, and confirmatory RT-PCR testing may be appropriate in some circumstances. The expected result is Negative. Fact Sheet for Patients: PodPark.tn Fact Sheet for Providers: GiftContent.is  This test is not yet approved or cleared by the [place] FDA and  has been authorized for detection and/or diagnosis of SARS-CoV-2 by FDA under an Emergency Use Authorization (EUA).  This EUA will remain in effect (meaning this test can be used) for the duration of  the COVID- 19 declaration under Section 564(b)(1) of the Act, 21 U.S.C. section 360bbb-3(b)(1), unless the authorization is terminated or revoked sooner. Performed at [facility], [address] [place], [place] [address]     Radiology Reports CT Head Wo Contrast  Result Date: 03/22/2019 CLINICAL DATA:  Altered mental status EXAM: CT HEAD WITHOUT CONTRAST TECHNIQUE: Contiguous axial images were obtained from the base of the skull through the vertex without intravenous contrast. COMPARISON:  CT brain 05/15/2015 FINDINGS: Brain: No acute territorial infarction, hemorrhage, or intracranial mass is visualized. Moderate atrophy, slightly progressed since  2017. Moderate hypodensity in the white matter, likely chronic small vessel ischemic change. Moderate enlargement of the ventricles, slightly progressed since 2017. Vascular: No hyperdense vessels.  Carotid vascular calcification Skull: Normal. Negative for fracture or focal lesion. Sinuses/Orbits: Mild mucosal thickening in the sinuses. Old fractures of the medial walls of both orbits Other: None IMPRESSION: 1. No definite CT evidence for acute intracranial abnormality. 2. Moderate ventricular enlargement, slightly increased since 2017, likely related to progression of atrophy. 3. Moderate hypodensity in the white matter consistent with chronic small vessel ischemic change Electronically Signed   By: Donavan Foil M.D.   On: 03/22/2019 19:48   US RENAL  Result Date: 03/27/2019 CLINICAL DATA:  Acute kidney injury, hypertension, type II diabetes mellitus EXAM: RENAL / URINARY TRACT ULTRASOUND COMPLETE COMPARISON:  05/15/2015 FINDINGS: Right Kidney: Renal measurements: 9.5 x 5.0 x 5.1 cm = volume: 125 mL. Mild cortical thinning. Increased cortical echogenicity. No mass, hydronephrosis or shadowing calcification. Left Kidney: Renal measurements: 9.7 x 5.4 x 5.2 cm = volume: 144 mL. Mild cortical thinning. Increased cortical echogenicity. No mass, hydronephrosis or shadowing calcification. Bladder: Decompressed by Foley catheter, unable to evaluate Other: N/A IMPRESSION: Medical renal disease changes of both kidneys without evidence of renal mass or hydronephrosis. Electronically Signed   By: Lavonia Dana M.D.   On: 03/27/2019 15:05   DG Chest Port 1 View  Result Date: 03/23/2019 CLINICAL DATA:  Worsening dyspnea.  COVID-19 positive. EXAM: PORTABLE CHEST 1 VIEW COMPARISON:  Radiograph 3 hours ago. FINDINGS: Unchanged heart size and mediastinal contours. Aortic atherosclerosis. Minimal vague airspace opacity in the mid lower right lung zone. Mild central bronchial thickening. No pulmonary edema, pleural effusion or  pneumothorax. Stable osseous structures. IMPRESSION: 1. Developing opacities in the right mid lower lung zone, likely related to COVID-19 pneumonia. 2. Central bronchial thickening. 3.  Aortic Atherosclerosis (ICD10-I70.0). Electronically Signed   By: Keith Rake M.D.   On: 03/23/2019 04:42   DG Chest Portable 1 View  Result  Date: 03/23/2019 CLINICAL DATA:  Initial evaluation for acute altered mental status. EXAM: PORTABLE CHEST 1 VIEW COMPARISON:  Prior radiograph from 07/29/2009. FINDINGS: The cardiac and mediastinal silhouettes are stable in size and contour, and remain within normal limits. Aortic atherosclerosis. The lungs are normally inflated. No airspace consolidation, pleural effusion, or pulmonary edema. No pneumothorax. No acute osseous abnormality. IMPRESSION: 1. No radiographic evidence for active cardiopulmonary disease. 2.  Aortic Atherosclerosis (ICD10-I70.0). Electronically Signed   By: Jeannine Boga M.D.   On: 03/23/2019 00:49   [place] EKG SITE RITE  Result Date: 03/24/2019 If Site Rite image not attached, placement could not be confirmed due to current cardiac rhythm.

## 2019-03-30 NOTE — Plan of Care (Signed)
Pt slept on and off during the night. Remains alert but disoriented x4, restless and combative at times, continuously tries to pull lines and dressings and attempts to get out of bed. B soft wrist restraints continued. No signs of pain or distress. Vitals stable on RA. Moderate Dependent  with ADLs. Incontinent of bladder and bowel, condom catheter replaced several times as pt still able to dislodge it. R heel dressing changed. Assisted regular repositioning for pressure relief. No other issues, will monitor.   Problem: Respiratory: Goal: Will maintain a patent airway 03/30/2019 0602 by Melvyn Neth, RN Outcome: Progressing 03/30/2019 0600 by Melvyn Neth, RN Outcome: Progressing Goal: Complications related to the disease process, condition or treatment will be avoided or minimized 03/30/2019 0602 by Melvyn Neth, RN Outcome: Progressing 03/30/2019 0600 by Melvyn Neth, RN Outcome: Progressing   Problem: Nutrition Goal: Patient maintains adequate hydration Outcome: Progressing Goal: Patient maintains weight Outcome: Progressing Goal: Patient/Family demonstrates understanding of diet Outcome: Progressing Goal: Patient/Family independently completes tube feeding Outcome: Progressing Goal: Patient will have no more than 5 lb weight change during LOS Outcome: Progressing Goal: Patient will utilize adaptive techniques to administer nutrition Outcome: Progressing Goal: Patient will verbalize dietary restrictions Outcome: Progressing

## 2019-03-31 LAB — CBC WITH DIFFERENTIAL/PLATELET
Abs Immature Granulocytes: 0.09 10*3/uL — ABNORMAL HIGH (ref 0.00–0.07)
Basophils Absolute: 0 10*3/uL (ref 0.0–0.1)
Basophils Relative: 0 %
Eosinophils Absolute: 0.1 10*3/uL (ref 0.0–0.5)
Eosinophils Relative: 1 %
HCT: 29.4 % — ABNORMAL LOW (ref 39.0–52.0)
Hemoglobin: 9.5 g/dL — ABNORMAL LOW (ref 13.0–17.0)
Immature Granulocytes: 1 %
Lymphocytes Relative: 11 %
Lymphs Abs: 0.9 10*3/uL (ref 0.7–4.0)
MCH: 28.7 pg (ref 26.0–34.0)
MCHC: 32.3 g/dL (ref 30.0–36.0)
MCV: 88.8 fL (ref 80.0–100.0)
Monocytes Absolute: 0.9 10*3/uL (ref 0.1–1.0)
Monocytes Relative: 11 %
Neutro Abs: 6.2 10*3/uL (ref 1.7–7.7)
Neutrophils Relative %: 76 %
Platelets: 243 10*3/uL (ref 150–400)
RBC: 3.31 MIL/uL — ABNORMAL LOW (ref 4.22–5.81)
RDW: 14.6 % (ref 11.5–15.5)
WBC: 8.2 10*3/uL (ref 4.0–10.5)
nRBC: 0 % (ref 0.0–0.2)

## 2019-03-31 LAB — COMPREHENSIVE METABOLIC PANEL
ALT: 24 U/L (ref 0–44)
AST: 24 U/L (ref 15–41)
Albumin: 2.5 g/dL — ABNORMAL LOW (ref 3.5–5.0)
Alkaline Phosphatase: 49 U/L (ref 38–126)
Anion gap: 11 (ref 5–15)
BUN: 88 mg/dL — ABNORMAL HIGH (ref 8–23)
CO2: 19 mmol/L — ABNORMAL LOW (ref 22–32)
Calcium: 8.2 mg/dL — ABNORMAL LOW (ref 8.9–10.3)
Chloride: 118 mmol/L — ABNORMAL HIGH (ref 98–111)
Creatinine, Ser: 3.93 mg/dL — ABNORMAL HIGH (ref 0.61–1.24)
GFR calc Af Amer: 15 mL/min — ABNORMAL LOW (ref >60)
GFR calc non Af Amer: 13 mL/min — ABNORMAL LOW (ref >60)
Glucose, Bld: 120 mg/dL — ABNORMAL HIGH (ref 70–99)
Potassium: 4.1 mmol/L (ref 3.5–5.1)
Sodium: 148 mmol/L — ABNORMAL HIGH (ref 135–145)
Total Bilirubin: 0.9 mg/dL (ref 0.3–1.2)
Total Protein: 6.3 g/dL — ABNORMAL LOW (ref 6.5–8.1)

## 2019-03-31 LAB — GLUCOSE, CAPILLARY
Glucose-Capillary: 118 mg/dL — ABNORMAL HIGH (ref 70–99)
Glucose-Capillary: 125 mg/dL — ABNORMAL HIGH (ref 70–99)
Glucose-Capillary: 146 mg/dL — ABNORMAL HIGH (ref 70–99)
Glucose-Capillary: 155 mg/dL — ABNORMAL HIGH (ref 70–99)
Glucose-Capillary: 282 mg/dL — ABNORMAL HIGH (ref 70–99)

## 2019-03-31 LAB — MAGNESIUM: Magnesium: 2.3 mg/dL (ref 1.7–2.4)

## 2019-03-31 LAB — D-DIMER, QUANTITATIVE: D-Dimer, Quant: 3.38 ug/mL-FEU — ABNORMAL HIGH (ref 0.00–0.50)

## 2019-03-31 LAB — C-REACTIVE PROTEIN: CRP: 10.1 mg/dL — ABNORMAL HIGH (ref <1.0)

## 2019-03-31 LAB — BRAIN NATRIURETIC PEPTIDE: B Natriuretic Peptide: 94.4 pg/mL (ref 0.0–100.0)

## 2019-03-31 MED ORDER — OXYCODONE HCL 5 MG PO TABS: 5.0000 mg | ORAL_TABLET | ORAL | Status: DC | PRN

## 2019-03-31 MED ORDER — OLANZAPINE 10 MG IM SOLR
2.5000 mg | Freq: Once | INTRAMUSCULAR | Status: AC
  Administered 2019-03-31: 2.5 mg via INTRAMUSCULAR
  Filled 2019-03-31: qty 10

## 2019-03-31 MED ORDER — HALOPERIDOL 2 MG PO TABS
2.0000 mg | ORAL_TABLET | Freq: Four times a day (QID) | ORAL | Status: DC | PRN
  Administered 2019-04-01: 2 mg via ORAL
  Filled 2019-03-31 (×2): qty 1

## 2019-03-31 MED ORDER — OLANZAPINE 5 MG PO TABS
5.0000 mg | ORAL_TABLET | Freq: Every day | ORAL | Status: DC
  Administered 2019-03-31: 22:00:00 5 mg via ORAL
  Filled 2019-03-31 (×2): qty 1

## 2019-03-31 MED ORDER — DEXTROSE 5 % IV SOLN: INTRAVENOUS | Status: DC

## 2019-03-31 MED ORDER — QUETIAPINE FUMARATE 25 MG PO TABS
50.0000 mg | ORAL_TABLET | Freq: Every day | ORAL | Status: DC
  Administered 2019-04-01: 50 mg via ORAL
  Filled 2019-03-31: qty 2

## 2019-03-31 NOTE — Progress Notes (Signed)
PROGRESS NOTE                                                                                                                                                                                                             Patient Demographics:    Marcus Weaver, is a 86 y.o. male, DOB - 1930-06-28, [id number]  Outpatient Primary MD for the patient is [name], MD    LOS - 9  Admit date - 03/22/2019    Chief Complaint  Patient presents with  . Hyperglycemia       Brief Narrative - Bernis Schreur is a 86 y.o. male with medical history significant of abnormal EKG, CKD, type 2 diabetes, hyperlipidemia, hypertension, hypertensive heart disease, tachycardia, history of UTI, dementia who was transferred from [facility] after presenting there with a history of hyperglycemia over 500 mg/dL for a week according to the patient's daughter.  He was found to be in DKA along with COVID-19 pneumonia and sent to [facility].  His main issue here has been encephalopathy.   Subjective:   Patient in bed visibly confused unable to answer questions reliably or follow commands, appears comfortable.   Assessment  & Plan :    1.  Acute Covid 19 Viral Pneumonitis during the ongoing 2020 Covid 19 Pandemic - he so far seems to have mild pulmonary disease, on IV steroids and remdesivir, currently not hypoxic will monitor closely.  He is actually currently on room air .  Encouraged the patient to sit up in chair in the daytime use I-S and flutter valve for pulmonary toiletry and then prone in bed when at night.  SpO2: 94 % O2 Flow Rate (L/min): 0 L/min  Recent Labs  Lab 03/27/19 0944 03/28/19 0415 03/29/19 0058 03/30/19 0140 03/31/19 0104  CRP 8.8* 15.6* 18.3* 14.6* 10.1*  DDIMER 6.52* 3.26* 3.11* 3.10* 3.38*  BNP 136.1* 125.0* 91.5 78.6 94.4    Hepatic Function Latest Ref Rng & Units 03/31/2019 03/30/2019 03/29/2019  Total Protein 6.5 - 8.1 g/dL  6.3(L) 6.4(L) 6.4(L)  Albumin 3.5 - 5.0 g/dL 2.5(L) 2.4(L) 2.4(L)  AST 15 - 41 U/L '24 26 29  '$ ALT 0 - 44 U/L '24 24 22  '$ Alk Phosphatase 38 - 126 U/L 49 52 50  Total Bilirubin 0.3 - 1.2 mg/dL 0.9 0.7 0.9  Bilirubin, Direct 0.0 -  0.3 mg/dL - - -    2.  DKA with wide anion gap metabolic acidosis in a patient with DM type II on insulin.  He has poor outpatient control due to hyperglycemia A1c of 10.9, he was treated with DKA protocol requiring IV insulin and IV fluids, DKA has now resolved has been transitioned to SQ insulin, monitor loosely.  Dose lowered as oral intake is tenuous.  Nursing staff informed.  Lab Results  Component Value Date   HGBA1C 10.9 (H) 03/25/2019   CBG (last 3)  Recent Labs    03/30/19 2059 03/31/19 0037 03/31/19 0346  GLUCAP 86 118* 125*    3.  AKI on CKD 5 with dehydration and hypernatremia.  Baseline creatinine close to 3.5, continue hydration, hold nephrotoxins, hold diuretics and ACE.  Renal ultrasound nonacute, had a Foley catheter which was removed successfully on 03/26/2019, continue to monitor post void bladder scan, since oral intake is tenuous will continue him on D5W, also has developed hypernatremia which is improving monitor.  His renal function is now close to baseline.  4.  Essential hypertension.  Coreg held due to some resting bradycardia, currently on Norvasc will add hydralazine as well.  5.  Metabolic encephalopathy.  CT head negative, no focal deficits, severely agitated, as needed Haldol, scheduled Seroquel, restraints as needed.  Family updated again on 03/27/2019, daughter understood his poor prognosis and poor outlook.  His severe encephalopathy is preventing him from eating or drinking anything for several days, negative care has already been consulted on 03/28/2019, await input, I also left a detailed message for patient's daughter on 03/29/2019 indicating acute eating drinking, remains encephalopathic, currently DNR plan is to continue with gentle IV  fluids, palliative care also involved family wants to take him home on 04/01/2019.  Goal is to continue gentle medical treatment.  He can go home tomorrow.  Daughter has been told clearly that his prognosis is extremely poor and that he remains very high risk for aspiration in the interim and also at risk of passing away.  She understands that.  6.  Poor IV access.  PICC line placed on 03/24/2019 unfortunately patient got delirious and pulled it out on 03/27/2019.     Condition -poor  Family Communication  :  Daughter Marcus Weaver - on 03/24/2019 at 10:40 AM, 03/27/2019 understands the poor prognosis.  Detailed message left on 03/28/2018 @   10:15 AM.  Talked to Dr. Raelyn Ensign again on 03/29/2019 at 5:30 PM along with her brother in detail.  Discussed again in detail on 03/31/2019.  Code Status : DNR  Diet :   Diet Order            DIET SOFT Room service appropriate? Yes; Fluid consistency: Thin  Diet effective now               Disposition Plan  : Likely home on 04/01/2019.  Consults  : Palliative care for goals of care  Procedures  :   PICC line and Foley placed this admission.    CT head.  Nonacute  PUD Prophylaxis :    DVT Prophylaxis  : Heparin   Lab Results  Component Value Date   PLT 243 03/31/2019    Inpatient Medications  Scheduled Meds: . amLODipine  10 mg Oral Daily  . vitamin C  500 mg Oral Daily  . carvedilol  12.5 mg Oral BID  . docusate sodium  100 mg Oral BID  . feeding supplement (NEPRO CARB STEADY)  237  mL Oral BID BM  . heparin injection (subcutaneous)  5,000 Units Subcutaneous Q8H  . hydrALAZINE  100 mg Oral Q8H  . insulin aspart  0-15 Units Subcutaneous Q4H  . insulin glargine  12 Units Subcutaneous Daily  . Ipratropium-Albuterol  2 puff Inhalation Q6H  . QUEtiapine  50 mg Oral BID  . zinc sulfate  220 mg Oral Daily   Continuous Infusions: . dextrose 125 mL/hr at 03/31/19 0908   PRN Meds:.acetaminophen, acetaminophen **OR** [DISCONTINUED] acetaminophen, dextrose,  guaiFENesin-dextromethorphan, haloperidol lactate, [DISCONTINUED] ondansetron **OR** ondansetron (ZOFRAN) IV  Antibiotics  :    Anti-infectives (From admission, onward)   Start     Dose/Rate Route Frequency Ordered Stop   03/24/19 1000  remdesivir 100 mg in sodium chloride 0.9 % 100 mL IVPB     100 mg 200 mL/hr over 30 Minutes Intravenous Daily 03/23/19 0522 03/27/19 0940   03/23/19 0600  remdesivir 200 mg in sodium chloride 0.9% 250 mL IVPB     200 mg 580 mL/hr over 30 Minutes Intravenous Once 03/23/19 0522 03/23/19 1235   03/22/19 2230  cefTRIAXone (ROCEPHIN) 1 g in sodium chloride 0.9 % 100 mL IVPB     1 g 200 mL/hr over 30 Minutes Intravenous  Once 03/22/19 2218 03/22/19 2301       Time Spent in minutes  30   Lala Lund M.D on 03/31/2019 at 9:31 AM  To page go to [email or web address] - password [facility]  [facility] -  Office  [phone]  See all Orders from today for further details    Objective:   Vitals:   03/31/19 0348 03/31/19 0450 03/31/19 0734 03/31/19 0855  BP:    (!) 157/94  Pulse:   96   Resp:    20  Temp: 97.6 F (36.4 C)   97.9 F (36.6 C)  TempSrc: Axillary   Axillary  SpO2:   94%   Weight:  86.4 kg    Height:        Wt Readings from Last 3 Encounters:  03/31/19 86.4 kg  03/19/19 95.7 kg  02/02/19 95.8 kg     Intake/Output Summary (Last 24 hours) at 03/31/2019 0931 Last data filed at 03/31/2019 0600 Gross per 24 hour  Intake 2411.82 ml  Output 450 ml  Net 1961.82 ml     Physical Exam  Awake but  confused, no focal deficits  [place].AT,  Supple Neck,No JVD, No cervical lymphadenopathy appriciated.  Symmetrical Chest wall movement, Good air movement bilaterally, CTAB RRR,No Gallops, Rubs or new Murmurs, No Parasternal Heave +ve B.Sounds, Abd Soft, No tenderness, No organomegaly appriciated, No rebound - guarding or rigidity. No Cyanosis, Clubbing or edema, No new Rash or bruise     Data Review:    CBC Recent Labs  Lab 03/27/19 0944  03/28/19 0415 03/29/19 0058 03/30/19 0140 03/31/19 0104  WBC 11.0* 8.6 8.6 8.9 8.2  HGB 11.1* 10.9* 10.2* 10.1* 9.5*  HCT 34.4* 33.4* 31.2* 30.7* 29.4*  PLT 286 258 254 244 243  MCV 87.8 89.3 89.4 90.0 88.8  MCH 28.3 29.1 29.2 29.6 28.7  MCHC 32.3 32.6 32.7 32.9 32.3  RDW 14.3 14.6 14.6 14.8 14.6  LYMPHSABS 0.8 0.6* 0.8 0.5* 0.9  MONOABS 1.0 0.9 0.9 0.8 0.9  EOSABS 0.0 0.0 0.0 0.0 0.1  BASOSABS 0.0 0.0 0.0 0.0 0.0    Chemistries  Recent Labs  Lab 03/27/19 0944 03/28/19 0415 03/29/19 0058 03/30/19 0140 03/31/19 0104  NA 154* 150* 149* 150* 148*  K 4.2  4.6 4.3 4.3 4.1  CL 123* 118* 117* 120* 118*  CO2 20* 17* 19* 18* 19*  GLUCOSE 65* 355* 154* 152* 120*  BUN 108* 103* 93* 91* 88*  CREATININE 3.94* 3.74* 3.80* 3.78* 3.93*  CALCIUM 8.6* 8.2* 8.3* 8.4* 8.2*  MG 2.3 2.2 2.3 2.5* 2.3  AST 46* 37 '29 26 24  '$ ALT '25 24 22 24 24  '$ ALKPHOS 50 51 50 52 49  BILITOT 0.8 0.6 0.9 0.7 0.9   ------------------------------------------------------------------------------------------------------------------ No results for input(s): CHOL, HDL, LDLCALC, TRIG, CHOLHDL, LDLDIRECT in the last 72 hours.  Lab Results  Component Value Date   HGBA1C 10.9 (H) 03/25/2019   ------------------------------------------------------------------------------------------------------------------ No results for input(s): TSH, T4TOTAL, T3FREE, THYROIDAB in the last 72 hours.  Invalid input(s): FREET3  Cardiac Enzymes No results for input(s): CKMB, TROPONINI, MYOGLOBIN in the last 168 hours.  Invalid input(s): CK ------------------------------------------------------------------------------------------------------------------    Component Value Date/Time   BNP 94.4 03/31/2019 0104    Micro Results Recent Results (from the past 240 hour(s))  Urine culture     Status: None   Collection Time: 03/22/19  9:30 PM   Specimen: Urine, Catheterized  Result Value Ref Range Status   Specimen Description    Final    URINE, CATHETERIZED Performed at [facility], [address] [place], [place] [address]    Special Requests   Final    NONE Performed at [facility], [address] [place], [place] [address]    Culture   Final    NO GROWTH Performed at [facility], [place] [address], [place], [place] [address]    Report Status 03/24/2019 FINAL  Final  SARS CORONAVIRUS 2 (TAT 6-24 HRS) Nasopharyngeal Nasopharyngeal Swab     Status: Abnormal   Collection Time: 03/22/19 10:38 PM   Specimen: Nasopharyngeal Swab  Result Value Ref Range Status   SARS Coronavirus 2 POSITIVE (A) NEGATIVE Final    Comment: RESULT CALLED TO, READ BACK BY AND VERIFIED WITH: S.GOUGE,RN [id number] I.MANNING (NOTE) SARS-CoV-2 target nucleic acids are DETECTED. The SARS-CoV-2 RNA is generally detectable in upper and lower respiratory specimens during the acute phase of infection. Positive results are indicative of the presence of SARS-CoV-2 RNA. Clinical correlation with patient history and other diagnostic information is  necessary to determine patient infection status. Positive results do not rule out bacterial infection or co-infection with other viruses.  The expected result is Negative. Fact Sheet for Patients: SugarRoll.be Fact Sheet for Healthcare Providers: https://www.woods-mathews.com/ This test is not yet approved or cleared by the [place] FDA and  has been authorized for detection and/or diagnosis of SARS-CoV-2 by FDA under an Emergency Use Authorization (EUA). This EUA will remain  in effect (meaning this test can be used) for the  duration of the COVID-19 declaration under Section 564(b)(1) of the Act, 21 U.S.C. section 360bbb-3(b)(1), unless the authorization is terminated or revoked sooner. Performed at [facility], [place] [address] [place], [place] [address]   SARS Coronavirus 2 Ag (30 min TAT) - Nasal Swab (BD  Veritor Kit)     Status: Abnormal   Collection Time: 03/22/19 11:39 PM   Specimen: Nasal Swab (BD Veritor Kit)  Result Value Ref Range Status   SARS Coronavirus 2 Ag POSITIVE (A) NEGATIVE Final    Comment: RESULT CALLED TO, READ BACK BY AND VERIFIED WITH: MAYNARD,C AT 0007 ON [id number] BY CHERESNOWSKY,T (NOTE) SARS-CoV-2 antigen PRESENT. Positive results indicate the presence of  viral antigens, but clinical correlation with patient history and other diagnostic information is necessary to determine patient infection status.  Positive results do not rule out bacterial infection or co-infection  with other viruses. False positive results are rare but can occur, and confirmatory RT-PCR testing may be appropriate in some circumstances. The expected result is Negative. Fact Sheet for Patients: PodPark.tn Fact Sheet for Providers: GiftContent.is  This test is not yet approved or cleared by the [place] FDA and  has been authorized for detection and/or diagnosis of SARS-CoV-2 by FDA under an Emergency Use Authorization (EUA).  This EUA will remain in effect (meaning this test can be used) for the duration of  the COVID- 19 declaration under Section 564(b)(1) of the Act, 21 U.S.C. section 360bbb-3(b)(1), unless the authorization is terminated or revoked sooner. Performed at [facility], [address] [place], [place] [address]     Radiology Reports CT Head Wo Contrast  Result Date: 03/22/2019 CLINICAL DATA:  Altered mental status EXAM: CT HEAD WITHOUT CONTRAST TECHNIQUE: Contiguous axial images were obtained from the base of the skull through the vertex without intravenous contrast. COMPARISON:  CT brain 05/15/2015 FINDINGS: Brain: No acute territorial infarction, hemorrhage, or intracranial mass is visualized. Moderate atrophy, slightly progressed since 2017. Moderate hypodensity in the white matter, likely chronic  small vessel ischemic change. Moderate enlargement of the ventricles, slightly progressed since 2017. Vascular: No hyperdense vessels.  Carotid vascular calcification Skull: Normal. Negative for fracture or focal lesion. Sinuses/Orbits: Mild mucosal thickening in the sinuses. Old fractures of the medial walls of both orbits Other: None IMPRESSION: 1. No definite CT evidence for acute intracranial abnormality. 2. Moderate ventricular enlargement, slightly increased since 2017, likely related to progression of atrophy. 3. Moderate hypodensity in the white matter consistent with chronic small vessel ischemic change Electronically Signed   By: Donavan Foil M.D.   On: 03/22/2019 19:48   US RENAL  Result Date: 03/27/2019 CLINICAL DATA:  Acute kidney injury, hypertension, type II diabetes mellitus EXAM: RENAL / URINARY TRACT ULTRASOUND COMPLETE COMPARISON:  05/15/2015 FINDINGS: Right Kidney: Renal measurements: 9.5 x 5.0 x 5.1 cm = volume: 125 mL. Mild cortical thinning. Increased cortical echogenicity. No mass, hydronephrosis or shadowing calcification. Left Kidney: Renal measurements: 9.7 x 5.4 x 5.2 cm = volume: 144 mL. Mild cortical thinning. Increased cortical echogenicity. No mass, hydronephrosis or shadowing calcification. Bladder: Decompressed by Foley catheter, unable to evaluate Other: N/A IMPRESSION: Medical renal disease changes of both kidneys without evidence of renal mass or hydronephrosis. Electronically Signed   By: Lavonia Dana M.D.   On: 03/27/2019 15:05   DG Chest Port 1 View  Result Date: 03/23/2019 CLINICAL DATA:  Worsening dyspnea.  COVID-19 positive. EXAM: PORTABLE CHEST 1 VIEW COMPARISON:  Radiograph 3 hours ago. FINDINGS: Unchanged heart size and mediastinal contours. Aortic atherosclerosis. Minimal vague airspace opacity in the mid lower right lung zone. Mild central bronchial thickening. No pulmonary edema, pleural effusion or pneumothorax. Stable osseous structures. IMPRESSION: 1.  Developing opacities in the right mid lower lung zone, likely related to COVID-19 pneumonia. 2. Central bronchial thickening. 3.  Aortic Atherosclerosis (ICD10-I70.0). Electronically Signed   By: Keith Rake M.D.   On: 03/23/2019 04:42   DG Chest Portable 1 View  Result Date: 03/23/2019 CLINICAL DATA:  Initial evaluation for acute altered mental status. EXAM: PORTABLE CHEST 1 VIEW COMPARISON:  Prior radiograph from 07/29/2009. FINDINGS: The cardiac and mediastinal silhouettes are stable in size and contour, and remain within  normal limits. Aortic atherosclerosis. The lungs are normally inflated. No airspace consolidation, pleural effusion, or pulmonary edema. No pneumothorax. No acute osseous abnormality. IMPRESSION: 1. No radiographic evidence for active cardiopulmonary disease. 2.  Aortic Atherosclerosis (ICD10-I70.0). Electronically Signed   By: Jeannine Boga M.D.   On: 03/23/2019 00:49   [place] EKG SITE RITE  Result Date: 03/24/2019 If Site Rite image not attached, placement could not be confirmed due to current cardiac rhythm.

## 2019-03-31 NOTE — TOC Initial Note (Addendum)
Transition of Care [facility]) - Initial/Assessment Note    Patient Details  Name: Marcus Weaver MRN: [id number] Date of Birth: November 14, 1930  Transition of Care [facility]) CM/SW Contact:    Atilano Median, LCSW Phone Number: 03/31/2019, 10:28 AM  Clinical Narrative:  Update: 10:35am CSW spoke with Bradd Canary. She will reach out to the family and start processing the referral.                  Admitted with medical history significant of abnormal EKG, CKD, type 2 diabetes, hyperlipidemia, hypertension, hypertensive heart disease, tachycardia, history of UTI, dementia who was transferred from [facility] after presenting there with a history of hyperglycemia over 500 mg/dL for a week according to the patient's daughter.   CSW spoke with patient's daugher Tomi Bamberger in regards to patient's discharge plan. Plan is for the patient to go home with hospice services. Tomi Bamberger is agreeable to this and would like a hospital bed prior to him coming home.   CSW called and left message for Jen Love [phone] liaison with [employer] per family request.  Plan is for patient to discharge home tomorrow with hospice services. Will need hospital bed.   Expected Discharge Plan: Home w Hospice Care Barriers to Discharge: Equipment Delay   Patient Goals and CMS Choice   CMS Medicare.gov Compare Post Acute Care list provided to:: Patient Represenative (must comment)(Marcia daughter) Choice offered to / list presented to : Adult Children  Expected Discharge Plan and Services Expected Discharge Plan: Home w Hospice Care In-house Referral: Clinical Social Work, Hospice / [place] Acute Care Choice: Hospice Living arrangements for the past 2 months: Single Family Home                                      Prior Living Arrangements/Services Living arrangements for the past 2 months: Single Family Home Lives with:: Self, Adult Children          Need for Family Participation in Patient Care: Yes  (Comment) Care giver support system in place?: Yes (comment)      Activities of Daily Living Home Assistive Devices/Equipment: Eyeglasses, Wheelchair, Environmental consultant (specify type) ADL Screening (condition at time of admission) Patient's cognitive ability adequate to safely complete daily activities?: Yes Is the patient deaf or have difficulty hearing?: No Does the patient have difficulty seeing, even when wearing glasses/contacts?: No Does the patient have difficulty concentrating, remembering, or making decisions?: Yes Patient able to express need for assistance with ADLs?: Yes Does the patient have difficulty dressing or bathing?: Yes Independently performs ADLs?: Yes (appropriate for developmental age) Does the patient have difficulty walking or climbing stairs?: Yes Weakness of Legs: Both Weakness of Arms/Hands: Both  Permission Sought/Granted                  Emotional Assessment              Admission diagnosis:  Uremia [N19] Encephalopathy [G93.40] Hyperglycemia [R73.9] Acute encephalopathy [G93.40] Urinary tract infection without hematuria, site unspecified [N39.0] COVID-19 [U07.1] Patient Active Problem List   Diagnosis Date Noted  . Palliative care encounter   . Pressure injury of skin 03/24/2019  . Pneumonia due to COVID-19 virus 03/23/2019  . Hyperosmolar non-ketotic state due to type 2 diabetes mellitus ([place]) 03/23/2019  . Acute encephalopathy 03/22/2019  . Essential hypertension 10/13/2015  . Hypertensive heart disease 08/14/2015  . Abnormal  EKG 08/14/2015  . Diabetes ([place]) 07/12/2015  . Tachycardia 07/11/2015  . CKD (chronic kidney disease) stage 4, GFR 15-29 ml/min (HCC) 05/18/2015  . Dementia ([place]) 05/15/2015  . Acute kidney injury ([place])   . Numbness in both hands 05/05/2015  . Polyuria 01/02/2015  . Broken teeth 06/01/2013  . Physical exam, annual 11/05/2011  . Hyperlipidemia associated with type 2 diabetes mellitus ([place]) 02/10/2010  . TOBACCO  ABUSE 01/24/2007  . GLAUCOMA 01/24/2007  . CATARACTS 01/24/2007  . CONSTIPATION 01/24/2007   PCP:  [name], MD Pharmacy:   [facility] [place], [address] [place] [address] Phone: [phone] Fax: [phone]     Social Determinants of Health (SDOH) Interventions    Readmission Risk Interventions No flowsheet data found.

## 2019-03-31 NOTE — Progress Notes (Signed)
Called patient's daughter Raelyn Ensign to update her about the patient's status.

## 2019-03-31 NOTE — Progress Notes (Signed)
Daily Progress Note   Patient Name: Marcus Weaver       Date: 03/31/2019 DOB: 1930/07/02  Age: 86 y.o. MRN#: [id number] Attending Physician: Thurnell Lose, MD Primary Care Physician: [name], MD Admit Date: 03/22/2019  Reason for Consultation/Follow-up: Establishing goals of care and Psychosocial/spiritual support   Discussed with [facility] Attending MD, RN, CSW and patient's sister Marcus Weaver.  Subjective: Patient remained agitated and encephalopathic over night per RN.  Spoke with Marcus Weaver.  She has made the decision to bring the patient home with hospice and is working to try to arrange the house and family support.   A hospital bed will need to be set up in the patient's home prior to his arrival home.  Also she requests that he come home by ambulance so that the EMTs can place him in bed.  Marcus Weaver expressed some distress about getting everything done by tomorrow noon.  I assured her that Hospice would be great assistance and attempted to calm her by explaining that we normally don't discharge comfort care patient's before a hospital bed is in place at home.    We discussed a trial of olanzapine to help with agitation and sundowning.  Per nursing patient did well with olanzapine this am.  Seemed to calm him.  Assessment: Patient with continued agitation and encephalopathy.  Creatinine trending up. Minimal urine output documented in the last 24 hours.  I believe dementia, covid and uremia are contributing to his encephalopathy   Patient Profile/HPI:  86 y.o. male with past medical history of DM2, CKD 4, hypertensive heart disease who was admitted on 03/22/2019 with COVID pneumonia and acute on chronic kidney failure.  His respiratory status seems to be stable despite COVID pneumonia and  his creatinine is returning to baseline, but he has suffered with persistent refractory encephalopathy since admission.  He is refusing most all p.o. intake and has been very agitated attempting to get out of bed and remove his IV.    Length of Stay: 9  Current Medications: Scheduled Meds:  . amLODipine  10 mg Oral Daily  . vitamin C  500 mg Oral Daily  . carvedilol  12.5 mg Oral BID  . docusate sodium  100 mg Oral BID  . feeding supplement (NEPRO CARB STEADY)  237 mL Oral BID BM  .  heparin injection (subcutaneous)  5,000 Units Subcutaneous Q8H  . hydrALAZINE  100 mg Oral Q8H  . insulin aspart  0-15 Units Subcutaneous Q4H  . insulin glargine  12 Units Subcutaneous Daily  . Ipratropium-Albuterol  2 puff Inhalation Q6H  . QUEtiapine  50 mg Oral BID  . zinc sulfate  220 mg Oral Daily    Continuous Infusions: . dextrose 125 mL/hr at 03/31/19 0908    PRN Meds: acetaminophen, acetaminophen **OR** [DISCONTINUED] acetaminophen, dextrose, guaiFENesin-dextromethorphan, haloperidol lactate, [DISCONTINUED] ondansetron **OR** ondansetron (ZOFRAN) IV    Vital Signs: BP (!) 157/94 (BP Location: Right Arm)   Pulse 96   Temp 97.9 F (36.6 C) (Axillary)   Resp 20   Ht 5\' 10"  (1.778 m)   Wt 86.4 kg   SpO2 94%   BMI 27.33 kg/m  SpO2: SpO2: 94 % O2 Device: O2 Device: Room Air O2 Flow Rate: O2 Flow Rate (L/min): 0 L/min  Intake/output summary:   Intake/Output Summary (Last 24 hours) at 03/31/2019 1024 Last data filed at 03/31/2019 0600 Gross per 24 hour  Intake 2411.82 ml  Output 450 ml  Net 1961.82 ml   LBM: Last BM Date: 03/30/19 Baseline Weight: Weight: 95 kg Most recent weight: Weight: 86.4 kg       Palliative Assessment/Data: 10%      Patient Active Problem List   Diagnosis Date Noted  . Palliative care encounter   . Pressure injury of skin 03/24/2019  . Pneumonia due to COVID-19 virus 03/23/2019  . Hyperosmolar non-ketotic state due to type 2 diabetes mellitus ([place])  03/23/2019  . Acute encephalopathy 03/22/2019  . Essential hypertension 10/13/2015  . Hypertensive heart disease 08/14/2015  . Abnormal EKG 08/14/2015  . Diabetes ([place]) 07/12/2015  . Tachycardia 07/11/2015  . CKD (chronic kidney disease) stage 4, GFR 15-29 ml/min ([facility]) 05/18/2015  . Dementia ([place]) 05/15/2015  . Acute kidney injury ([place])   . Numbness in both hands 05/05/2015  . Polyuria 01/02/2015  . Broken teeth 06/01/2013  . Physical exam, annual 11/05/2011  . Hyperlipidemia associated with type 2 diabetes mellitus ([place]) 02/10/2010  . TOBACCO ABUSE 01/24/2007  . GLAUCOMA 01/24/2007  . CATARACTS 01/24/2007  . CONSTIPATION 01/24/2007    Palliative Care Plan    Recommendations/Plan:  TOC has referred patient to Hospice.   Patient will be able to discharge once a hospital bed is placed in his home.  (hopefully tomorrow but possibly Sunday).  Will recommend olanzepine 5 mg qhs instead of his qhs seroquel dose.  Please discharge with Rx for the following comfort PRNs:  (1) roxicodone intensol concentrate 5-10 mg SL PRN pain/SOB; (2) haldol solution 2 mg q 4 PRN agitation; (3) olanzapine 5 mg PO qhs give with applesauce.  Patient needs ambulance transfer home.  Goals of Care and Additional Recommendations:  Limitations on Scope of Treatment: Comfort measures with reasonable maintenance medications (amlodipine, combivent)  Code Status:  DNR  Prognosis:  Weeks   Discharge Planning:  Home with Hospice once hospital bed is set up in Marcia's home.   Thank you for allowing the Palliative Medicine Team to assist in the care of this patient.  Total time spent:  35 min.     Greater than 50%  of this time was spent counseling and coordinating care related to the above assessment and plan.  Florentina Jenny, PA-C Palliative Medicine  Please contact Palliative MedicineTeam phone at [phone] for questions and concerns between 7 am - 7 pm.   Please see AMION for  individual provider pager numbers.

## 2019-03-31 NOTE — Progress Notes (Signed)
This RN covering for primary nurse, pts IV alarming, pt w/increased anxiety, agitation and bending arm, fighting restraints, causing IV to alarm in Left AC PIV. Line flushed, Patent when arm straightened, attempted redirecting w/little lasting effect. Pt will relax temporarily, Haldol 2mg  as per orders given. Restraints loosened and then resumed. Pt seeming slightly more relaxed s/p 14min of adminstration. Not fighting restraiints and agitation somewhat better. Will report these happenings to primary RN and c/t monitor pts status and effectiveness of Haldol.

## 2019-03-31 NOTE — Plan of Care (Signed)
  Problem: Respiratory: Goal: Will maintain a patent airway Outcome: Progressing Goal: Complications related to the disease process, condition or treatment will be avoided or minimized Outcome: Progressing   Problem: Nutrition Goal: Patient maintains adequate hydration Outcome: Progressing Goal: Patient maintains weight Outcome: Progressing Goal: Patient/Family demonstrates understanding of diet Outcome: Progressing Goal: Patient/Family independently completes tube feeding Outcome: Progressing Goal: Patient will have no more than 5 lb weight change during LOS Outcome: Progressing Goal: Patient will utilize adaptive techniques to administer nutrition Outcome: Progressing Goal: Patient will verbalize dietary restrictions Outcome: Progressing   Problem: Clinical Measurements: Goal: Ability to maintain clinical measurements within normal limits will improve Outcome: Progressing Goal: Will remain free from infection Outcome: Progressing Goal: Diagnostic test results will improve Outcome: Progressing Goal: Respiratory complications will improve Outcome: Progressing Goal: Cardiovascular complication will be avoided Outcome: Progressing   Problem: Activity: Goal: Risk for activity intolerance will decrease Outcome: Progressing   Problem: Nutrition: Goal: Adequate nutrition will be maintained Outcome: Progressing   Problem: Coping: Goal: Level of anxiety will decrease Outcome: Progressing   Problem: Elimination: Goal: Will not experience complications related to bowel motility Outcome: Progressing Goal: Will not experience complications related to urinary retention Outcome: Progressing   Problem: Pain Managment: Goal: General experience of comfort will improve Outcome: Progressing   Problem: Safety: Goal: Ability to remain free from injury will improve Outcome: Progressing   Problem: Skin Integrity: Goal: Risk for impaired skin integrity will decrease Outcome:  Progressing

## 2019-03-31 NOTE — Plan of Care (Signed)
  Problem: Respiratory: Goal: Will maintain a patent airway Outcome: Progressing Goal: Complications related to the disease process, condition or treatment will be avoided or minimized Outcome: Progressing   Problem: Nutrition Goal: Patient maintains adequate hydration Outcome: Progressing Goal: Patient maintains weight Outcome: Progressing Goal: Patient/Family demonstrates understanding of diet Outcome: Progressing Goal: Patient/Family independently completes tube feeding Outcome: Progressing Goal: Patient will have no more than 5 lb weight change during LOS Outcome: Progressing Goal: Patient will utilize adaptive techniques to administer nutrition Outcome: Progressing Goal: Patient will verbalize dietary restrictions Outcome: Progressing   Problem: Clinical Measurements: Goal: Will remain free from infection Outcome: Progressing Goal: Respiratory complications will improve Outcome: Progressing Goal: Cardiovascular complication will be avoided Outcome: Progressing   Problem: Pain Managment: Goal: General experience of comfort will improve Outcome: Progressing   Problem: Skin Integrity: Goal: Risk for impaired skin integrity will decrease Outcome: Progressing

## 2019-03-31 NOTE — Progress Notes (Addendum)
Geneticist, molecular received referral for pt to go home with hospice care.   Spoke with daughter, Tomi Bamberger, to confirm interest. DME need at this time is a hospital bed.   Pt's chart in review by [facility] MD for hospice eligibility. Will update TOC and daughter once eligibility is determined.   Please reach out with any questions and thank you for the referral.   1510 Addendum: Pt has been approved for hospice services at home and bed has been ordered. Pending delivery. Daughter informed of information.  Freddie Breech, RN [facility] Liaison [phone]

## 2019-04-01 LAB — COMPREHENSIVE METABOLIC PANEL
ALT: 21 U/L (ref 0–44)
AST: 25 U/L (ref 15–41)
Albumin: 2.4 g/dL — ABNORMAL LOW (ref 3.5–5.0)
Alkaline Phosphatase: 46 U/L (ref 38–126)
Anion gap: 10 (ref 5–15)
BUN: 86 mg/dL — ABNORMAL HIGH (ref 8–23)
CO2: 19 mmol/L — ABNORMAL LOW (ref 22–32)
Calcium: 8.2 mg/dL — ABNORMAL LOW (ref 8.9–10.3)
Chloride: 115 mmol/L — ABNORMAL HIGH (ref 98–111)
Creatinine, Ser: 3.7 mg/dL — ABNORMAL HIGH (ref 0.61–1.24)
GFR calc Af Amer: 16 mL/min — ABNORMAL LOW (ref >60)
GFR calc non Af Amer: 14 mL/min — ABNORMAL LOW (ref >60)
Glucose, Bld: 107 mg/dL — ABNORMAL HIGH (ref 70–99)
Potassium: 4.5 mmol/L (ref 3.5–5.1)
Sodium: 144 mmol/L (ref 135–145)
Total Bilirubin: 0.8 mg/dL (ref 0.3–1.2)
Total Protein: 6.4 g/dL — ABNORMAL LOW (ref 6.5–8.1)

## 2019-04-01 LAB — GLUCOSE, CAPILLARY
Glucose-Capillary: 99 mg/dL (ref 70–99)
Glucose-Capillary: 120 mg/dL — ABNORMAL HIGH (ref 70–99)
Glucose-Capillary: 116 mg/dL — ABNORMAL HIGH (ref 70–99)
Glucose-Capillary: 189 mg/dL — ABNORMAL HIGH (ref 70–99)

## 2019-04-01 LAB — CBC WITH DIFFERENTIAL/PLATELET
Abs Immature Granulocytes: 0.07 10*3/uL (ref 0.00–0.07)
Basophils Absolute: 0 10*3/uL (ref 0.0–0.1)
Basophils Relative: 0 %
Eosinophils Absolute: 0.1 10*3/uL (ref 0.0–0.5)
Eosinophils Relative: 1 %
HCT: 29 % — ABNORMAL LOW (ref 39.0–52.0)
Hemoglobin: 9.3 g/dL — ABNORMAL LOW (ref 13.0–17.0)
Immature Granulocytes: 1 %
Lymphocytes Relative: 8 %
Lymphs Abs: 0.6 10*3/uL — ABNORMAL LOW (ref 0.7–4.0)
MCH: 28.8 pg (ref 26.0–34.0)
MCHC: 32.1 g/dL (ref 30.0–36.0)
MCV: 89.8 fL (ref 80.0–100.0)
Monocytes Absolute: 0.7 10*3/uL (ref 0.1–1.0)
Monocytes Relative: 9 %
Neutro Abs: 6.6 10*3/uL (ref 1.7–7.7)
Neutrophils Relative %: 81 %
Platelets: 205 10*3/uL (ref 150–400)
RBC: 3.23 MIL/uL — ABNORMAL LOW (ref 4.22–5.81)
RDW: 14.5 % (ref 11.5–15.5)
WBC: 8.1 10*3/uL (ref 4.0–10.5)
nRBC: 0 % (ref 0.0–0.2)

## 2019-04-01 LAB — D-DIMER, QUANTITATIVE: D-Dimer, Quant: 2.45 ug/mL-FEU — ABNORMAL HIGH (ref 0.00–0.50)

## 2019-04-01 LAB — BRAIN NATRIURETIC PEPTIDE: B Natriuretic Peptide: 153.6 pg/mL — ABNORMAL HIGH (ref 0.0–100.0)

## 2019-04-01 LAB — MAGNESIUM: Magnesium: 2.5 mg/dL — ABNORMAL HIGH (ref 1.7–2.4)

## 2019-04-01 LAB — C-REACTIVE PROTEIN: CRP: 11.9 mg/dL — ABNORMAL HIGH (ref <1.0)

## 2019-04-01 NOTE — Discharge Instructions (Signed)
Follow with Primary MD [name], MD in 7- 14 days   Get CBC, CMP, 2 view Chest X ray -  checked next visit within 1-2 weeks by Primary MD    Activity: As tolerated with Full fall precautions use walker/cane & assistance as needed  Disposition Home   Diet: Soft heart healthy diet with feeding assistance and aspiration precautions  Special Instructions: If you have smoked or chewed Tobacco  in the last 2 yrs please stop smoking, stop any regular Alcohol  and or any Recreational drug use.  On your next visit with your primary care physician please Get Medicines reviewed and adjusted.  Please request your Prim.MD to go over all Hospital Tests and Procedure/Radiological results at the follow up, please get all Hospital records sent to your Prim MD by signing hospital release before you go home.  If you experience worsening of your admission symptoms, develop shortness of breath, life threatening emergency, suicidal or homicidal thoughts you must seek medical attention immediately by calling 911 or calling your MD immediately  if symptoms less severe.  You Must read complete instructions/literature along with all the possible adverse reactions/side effects for all the Medicines you take and that have been prescribed to you. Take any new Medicines after you have completely understood and accpet all the possible adverse reactions/side effects.       Person Under Monitoring Name: Marcus Weaver  Location: [place] [address]   Infection Prevention Recommendations for Individuals Confirmed to have, or Being Evaluated for, 2019 Novel Coronavirus (COVID-19) Infection Who Receive Care at Home  Individuals who are confirmed to have, or are being evaluated for, COVID-19 should follow the prevention steps below until a healthcare provider or local or state health department says they can return to normal activities.  Stay home except to get medical care You should restrict  activities outside your home, except for getting medical care. Do not go to work, school, or public areas, and do not use public transportation or taxis.  Call ahead before visiting your doctor Before your medical appointment, call the healthcare provider and tell them that you have, or are being evaluated for, COVID-19 infection. This will help the healthcare provider's office take steps to keep other people from getting infected. Ask your healthcare provider to call the local or state health department.  Monitor your symptoms Seek prompt medical attention if your illness is worsening (e.g., difficulty breathing). Before going to your medical appointment, call the healthcare provider and tell them that you have, or are being evaluated for, COVID-19 infection. Ask your healthcare provider to call the local or state health department.  Wear a facemask You should wear a facemask that covers your nose and mouth when you are in the same room with other people and when you visit a healthcare provider. People who live with or visit you should also wear a facemask while they are in the same room with you.  Separate yourself from other people in your home As much as possible, you should stay in a different room from other people in your home. Also, you should use a separate bathroom, if available.  Avoid sharing household items You should not share dishes, drinking glasses, cups, eating utensils, towels, bedding, or other items with other people in your home. After using these items, you should wash them thoroughly with soap and water.  Cover your coughs and sneezes Cover your mouth and nose with a tissue when you cough or  sneeze, or you can cough or sneeze into your sleeve. Throw used tissues in a lined trash can, and immediately wash your hands with soap and water for at least 20 seconds or use an alcohol-based hand rub.  Wash your [facility] your hands often and thoroughly with soap and  water for at least 20 seconds. You can use an alcohol-based hand sanitizer if soap and water are not available and if your hands are not visibly dirty. Avoid touching your eyes, nose, and mouth with unwashed hands.   Prevention Steps for Caregivers and Household Members of Individuals Confirmed to have, or Being Evaluated for, COVID-19 Infection Being Cared for in the Home  If you live with, or provide care at home for, a person confirmed to have, or being evaluated for, COVID-19 infection please follow these guidelines to prevent infection:  Follow healthcare provider's instructions Make sure that you understand and can help the patient follow any healthcare provider instructions for all care.  Provide for the patient's basic needs You should help the patient with basic needs in the home and provide support for getting groceries, prescriptions, and other personal needs.  Monitor the patient's symptoms If they are getting sicker, call his or her medical provider and tell them that the patient has, or is being evaluated for, COVID-19 infection. This will help the healthcare provider's office take steps to keep other people from getting infected. Ask the healthcare provider to call the local or state health department.  Limit the number of people who have contact with the patient  If possible, have only one caregiver for the patient.  Other household members should stay in another home or place of residence. If this is not possible, they should stay  in another room, or be separated from the patient as much as possible. Use a separate bathroom, if available.  Restrict visitors who do not have an essential need to be in the home.  Keep older adults, very young children, and other sick people away from the patient Keep older adults, very young children, and those who have compromised immune systems or chronic health conditions away from the patient. This includes people with chronic  heart, lung, or kidney conditions, diabetes, and cancer.  Ensure good ventilation Make sure that shared spaces in the home have good air flow, such as from an air conditioner or an opened window, weather permitting.  Wash your hands often  Wash your hands often and thoroughly with soap and water for at least 20 seconds. You can use an alcohol based hand sanitizer if soap and water are not available and if your hands are not visibly dirty.  Avoid touching your eyes, nose, and mouth with unwashed hands.  Use disposable paper towels to dry your hands. If not available, use dedicated cloth towels and replace them when they become wet.  Wear a facemask and gloves  Wear a disposable facemask at all times in the room and gloves when you touch or have contact with the patient's blood, body fluids, and/or secretions or excretions, such as sweat, saliva, sputum, nasal mucus, vomit, urine, or feces.  Ensure the mask fits over your nose and mouth tightly, and do not touch it during use.  Throw out disposable facemasks and gloves after using them. Do not reuse.  Wash your hands immediately after removing your facemask and gloves.  If your personal clothing becomes contaminated, carefully remove clothing and launder. Wash your hands after handling contaminated clothing.  Place all used  disposable facemasks, gloves, and other waste in a lined container before disposing them with other household waste.  Remove gloves and wash your hands immediately after handling these items.  Do not share dishes, glasses, or other household items with the patient  Avoid sharing household items. You should not share dishes, drinking glasses, cups, eating utensils, towels, bedding, or other items with a patient who is confirmed to have, or being evaluated for, COVID-19 infection.  After the person uses these items, you should wash them thoroughly with soap and water.  Wash laundry thoroughly  Immediately remove  and wash clothes or bedding that have blood, body fluids, and/or secretions or excretions, such as sweat, saliva, sputum, nasal mucus, vomit, urine, or feces, on them.  Wear gloves when handling laundry from the patient.  Read and follow directions on labels of laundry or clothing items and detergent. In general, wash and dry with the warmest temperatures recommended on the label.  Clean all areas the individual has used often  Clean all touchable surfaces, such as counters, tabletops, doorknobs, bathroom fixtures, toilets, phones, keyboards, tablets, and bedside tables, every day. Also, clean any surfaces that may have blood, body fluids, and/or secretions or excretions on them.  Wear gloves when cleaning surfaces the patient has come in contact with.  Use a diluted bleach solution (e.g., dilute bleach with 1 part bleach and 10 parts water) or a household disinfectant with a label that says EPA-registered for coronaviruses. To make a bleach solution at home, add 1 tablespoon of bleach to 1 quart (4 cups) of water. For a larger supply, add  cup of bleach to 1 gallon (16 cups) of water.  Read labels of cleaning products and follow recommendations provided on product labels. Labels contain instructions for safe and effective use of the cleaning product including precautions you should take when applying the product, such as wearing gloves or eye protection and making sure you have good ventilation during use of the product.  Remove gloves and wash hands immediately after cleaning.  Monitor yourself for signs and symptoms of illness Caregivers and household members are considered close contacts, should monitor their health, and will be asked to limit movement outside of the home to the extent possible. Follow the monitoring steps for close contacts listed on the symptom monitoring form.   ? If you have additional questions, contact your local health department or call the epidemiologist on call  at [phone] (available 24/7). ? This guidance is subject to change. For the most up-to-date guidance from [facility], please refer to their website: YouBlogs.pl

## 2019-04-01 NOTE — Discharge Summary (Signed)
Marcus Weaver BVP:[id number] DOB: 1930-09-30 DOA: 03/22/2019  PCP: [name], MD  Admit date: 03/22/2019  Discharge date: 04/01/2019  Admitted From: Home   Disposition:  Home with Hospice   Recommendations for Outpatient Follow-up:   Follow up with PCP in 1-2 weeks  PCP Please obtain BMP/CBC, 2 view CXR in 1week,  (see Discharge instructions)   PCP Please follow up on the following pending results:    Home Health: Hospice   Equipment/Devices: None  Consultations: None  Discharge Condition: Stable    CODE STATUS: Full    Diet Recommendation: Soft, heart healthy diet with feeding assistance and aspiration precautions    Chief Complaint  Patient presents with  . Hyperglycemia     Brief history of present illness from the day of admission and additional interim summary    Marcus Currieis a 86 y.o.malewith medical history significant ofabnormal EKG, CKD, type 2 diabetes, hyperlipidemia, hypertension, hypertensive heart disease, tachycardia, history of UTI, dementia who was transferred from [facility] presenting there with a history of hyperglycemia over 500 mg/dL for a week according to the patient's daughter.  He was found to be in DKA along with COVID-19 pneumonia and sent to [facility].  His main issue here has been encephalopathy, now being discharged with home hospice per family wishes.                                                                 Hospital Course    1.  Acute Covid 19 Viral Pneumonitis during the ongoing 2020 Covid 19 Pandemic - he so far seems to have mild pulmonary disease, he is symptom-free on room air and has finished his course of steroids and remdesivir, will be discharged with home hospice per family wishes.  SpO2: 93 % O2 Flow Rate (L/min): 0 L/min  Recent Labs  Lab  03/28/19 0415 03/29/19 0058 03/30/19 0140 03/31/19 0104 04/01/19 0515  CRP 15.6* 18.3* 14.6* 10.1* 11.9*  DDIMER 3.26* 3.11* 3.10* 3.38* 2.45*  BNP 125.0* 91.5 78.6 94.4 153.6*    Hepatic Function Latest Ref Rng & Units 04/01/2019 03/31/2019 03/30/2019  Total Protein 6.5 - 8.1 g/dL 6.4(L) 6.3(L) 6.4(L)  Albumin 3.5 - 5.0 g/dL 2.4(L) 2.5(L) 2.4(L)  AST 15 - 41 U/L '25 24 26  '$ ALT 0 - 44 U/L '21 24 24  '$ Alk Phosphatase 38 - 126 U/L 46 49 52  Total Bilirubin 0.3 - 1.2 mg/dL 0.8 0.9 0.7  Bilirubin, Direct 0.0 - 0.3 mg/dL - - -   2.  DKA with wide anion gap metabolic acidosis in a patient with DM type II on insulin.    He was treated with DKA protocol and DKA has resolved, he has poor outpatient control due to hyperglycemia, currently being discharged on home regimen and his oral intake is  still not consistent, follow with PCP as desired for better glycemic control and A1c monitoring.  Lab Results  Component Value Date   HGBA1C 10.9 (H) 03/25/2019   CBG (last 3)  Recent Labs    03/31/19 2031 04/01/19 0026 04/01/19 0610  GLUCAP 155* 99 120*    3.  AKI on CKD 5 with dehydration and hypernatremia.  Baseline creatinine close to 3.5, continue hydration, hold nephrotoxins, hold diuretics and ACE.  Renal ultrasound nonacute, had a Foley catheter which was removed successfully on 03/26/2019, continue to monitor post void bladder scan,  hydration with D5W his renal function is close to baseline, follow with PCP.  4.  Essential hypertension.  Coreg held due to some resting bradycardia, currently on Norvasc will add hydralazine as well.  5.  Metabolic encephalopathy.  CT head negative, no focal deficits, severely agitated, he was treated with as needed Seroquel and Haldol, often required restraints.  This was discussed in detail with patient's daughter and son, plan was gentle medical treatment if declines comfort measures.  At this point family thinks that patient might perk up at home as that is his  familiar surroundings, I think this is a reasonable approach.  He was also seen by palliative care.   Plan is to discharge patient home on home medications, home hospice to follow-up.  If he does better than follow with PCP in a week or 2.  If he declines further then focus on comfort measures.      Discharge diagnosis     Principal Problem:   Pneumonia due to COVID-19 virus Active Problems:   Acute kidney injury ([facility])   Essential hypertension   Acute encephalopathy   Hyperosmolar non-ketotic state due to type 2 diabetes mellitus (HCC)   Pressure injury of skin   Palliative care encounter    Discharge instructions    Discharge Instructions    Discharge instructions   Complete by: As directed    Follow with Primary MD [name], MD in 7- 14 days   Get CBC, CMP, 2 view Chest X ray -  checked next visit within 1-2 weeks by Primary MD    Activity: As tolerated with Full fall precautions use walker/cane & assistance as needed  Disposition Home   Diet: Soft heart healthy diet with feeding assistance and aspiration precautions  Special Instructions: If you have smoked or chewed Tobacco  in the last 2 yrs please stop smoking, stop any regular Alcohol  and or any Recreational drug use.  On your next visit with your primary care physician please Get Medicines reviewed and adjusted.  Please request your Prim.MD to go over all Hospital Tests and Procedure/Radiological results at the follow up, please get all Hospital records sent to your Prim MD by signing hospital release before you go home.  If you experience worsening of your admission symptoms, develop shortness of breath, life threatening emergency, suicidal or homicidal thoughts you must seek medical attention immediately by calling 911 or calling your MD immediately  if symptoms less severe.  You Must read complete instructions/literature along with all the possible adverse reactions/side effects for all the Medicines  you take and that have been prescribed to you. Take any new Medicines after you have completely understood and accpet all the possible adverse reactions/side effects.   Increase activity slowly   Complete by: As directed    MyChart COVID-19 home monitoring program   Complete by: Apr 01, 2019    Is the patient willing  to use the [employer] for home monitoring?: Yes   Temperature monitoring   Complete by: Apr 01, 2019    After how many days would you like to receive a notification of this patient's flowsheet entries?: 1      Discharge Medications   Allergies as of 04/01/2019   No Known Allergies     Medication List    STOP taking these medications   lisinopril 20 MG tablet Commonly known as: ZESTRIL     TAKE these medications   acetaminophen 325 MG tablet Commonly known as: TYLENOL Take 325-650 mg by mouth every 6 (six) hours as needed (for pain.).   amLODipine 5 MG tablet Commonly known as: NORVASC Take 1 tablet (5 mg total) by mouth daily. Please make an overdue appt with Dr. Radford Pax before anymore refills. 3rd and Final   calcitRIOL 0.25 MCG capsule Commonly known as: ROCALTROL Take 0.25 mcg by mouth daily.   carvedilol 12.5 MG tablet Commonly known as: COREG Take 12.5 mg by mouth 2 (two) times daily.   furosemide 80 MG tablet Commonly known as: LASIX Take 80-160 mg by mouth See admin instructions. Take 2 tablets (160 mg) by mouth in the morning & take 1 tablet (80 mg) by mouth at noon.   gentamicin cream 0.1 % Commonly known as: GARAMYCIN Apply 1 application topically 2 (two) times daily. What changed:   when to take this  reasons to take this   HumuLIN N KwikPen 100 UNIT/ML Kiwkpen Generic drug: Insulin NPH (Human) (Isophane) Inject 60 Units into the skin every morning. And pen needles 1/day What changed: when to take this   OneTouch Verio test strip Generic drug: glucose blood 1 each by Other route 2 (two) times daily. E11.9   QUEtiapine 25 MG  tablet Commonly known as: SEROQUEL TAKE 1 TABLET(25 MG) BY MOUTH AT BEDTIME What changed:   how much to take  how to take this  when to take this  additional instructions   tamsulosin 0.4 MG Caps capsule Commonly known as: FLOMAX Take 0.4 mg by mouth daily.       Follow-up Information    [name], MD. Schedule an appointment as soon as possible for a visit in 1 week(s).   Specialty: Family Medicine Contact information: [phone] [place] [address] [phone]           Major procedures and Radiology Reports - PLEASE review detailed and final reports thoroughly  -       CT Head Wo Contrast  Result Date: 03/22/2019 CLINICAL DATA:  Altered mental status EXAM: CT HEAD WITHOUT CONTRAST TECHNIQUE: Contiguous axial images were obtained from the base of the skull through the vertex without intravenous contrast. COMPARISON:  CT brain 05/15/2015 FINDINGS: Brain: No acute territorial infarction, hemorrhage, or intracranial mass is visualized. Moderate atrophy, slightly progressed since 2017. Moderate hypodensity in the white matter, likely chronic small vessel ischemic change. Moderate enlargement of the ventricles, slightly progressed since 2017. Vascular: No hyperdense vessels.  Carotid vascular calcification Skull: Normal. Negative for fracture or focal lesion. Sinuses/Orbits: Mild mucosal thickening in the sinuses. Old fractures of the medial walls of both orbits Other: None IMPRESSION: 1. No definite CT evidence for acute intracranial abnormality. 2. Moderate ventricular enlargement, slightly increased since 2017, likely related to progression of atrophy. 3. Moderate hypodensity in the white matter consistent with chronic small vessel ischemic change Electronically Signed   By: Donavan Foil M.D.   On: 03/22/2019 19:48  US RENAL  Result Date: 03/27/2019 CLINICAL DATA:  Acute kidney injury, hypertension, type II diabetes mellitus EXAM: RENAL / URINARY TRACT  ULTRASOUND COMPLETE COMPARISON:  05/15/2015 FINDINGS: Right Kidney: Renal measurements: 9.5 x 5.0 x 5.1 cm = volume: 125 mL. Mild cortical thinning. Increased cortical echogenicity. No mass, hydronephrosis or shadowing calcification. Left Kidney: Renal measurements: 9.7 x 5.4 x 5.2 cm = volume: 144 mL. Mild cortical thinning. Increased cortical echogenicity. No mass, hydronephrosis or shadowing calcification. Bladder: Decompressed by Foley catheter, unable to evaluate Other: N/A IMPRESSION: Medical renal disease changes of both kidneys without evidence of renal mass or hydronephrosis. Electronically Signed   By: Lavonia Dana M.D.   On: 03/27/2019 15:05   DG Chest Port 1 View  Result Date: 03/23/2019 CLINICAL DATA:  Worsening dyspnea.  COVID-19 positive. EXAM: PORTABLE CHEST 1 VIEW COMPARISON:  Radiograph 3 hours ago. FINDINGS: Unchanged heart size and mediastinal contours. Aortic atherosclerosis. Minimal vague airspace opacity in the mid lower right lung zone. Mild central bronchial thickening. No pulmonary edema, pleural effusion or pneumothorax. Stable osseous structures. IMPRESSION: 1. Developing opacities in the right mid lower lung zone, likely related to COVID-19 pneumonia. 2. Central bronchial thickening. 3.  Aortic Atherosclerosis (ICD10-I70.0). Electronically Signed   By: Keith Rake M.D.   On: 03/23/2019 04:42   DG Chest Portable 1 View  Result Date: 03/23/2019 CLINICAL DATA:  Initial evaluation for acute altered mental status. EXAM: PORTABLE CHEST 1 VIEW COMPARISON:  Prior radiograph from 07/29/2009. FINDINGS: The cardiac and mediastinal silhouettes are stable in size and contour, and remain within normal limits. Aortic atherosclerosis. The lungs are normally inflated. No airspace consolidation, pleural effusion, or pulmonary edema. No pneumothorax. No acute osseous abnormality. IMPRESSION: 1. No radiographic evidence for active cardiopulmonary disease. 2.  Aortic Atherosclerosis (ICD10-I70.0).  Electronically Signed   By: Jeannine Boga M.D.   On: 03/23/2019 00:49   [place] EKG SITE RITE  Result Date: 03/24/2019 If Site Rite image not attached, placement could not be confirmed due to current cardiac rhythm.   Micro Results     Recent Results (from the past 240 hour(s))  Urine culture     Status: None   Collection Time: 03/22/19  9:30 PM   Specimen: Urine, Catheterized  Result Value Ref Range Status   Specimen Description   Final    URINE, CATHETERIZED Performed at [facility], [address] [place], [place] [address]    Special Requests   Final    NONE Performed at [facility], [address] [place], [place] [address]    Culture   Final    NO GROWTH Performed at [facility], [place] [address] [place],  [address]    Report Status 03/24/2019 FINAL  Final  SARS CORONAVIRUS 2 (TAT 6-24 HRS) Nasopharyngeal Nasopharyngeal Swab     Status: Abnormal   Collection Time: 03/22/19 10:38 PM   Specimen: Nasopharyngeal Swab  Result Value Ref Range Status   SARS Coronavirus 2 POSITIVE (A) NEGATIVE Final    Comment: RESULT CALLED TO, READ BACK BY AND VERIFIED WITH: S.GOUGE,RN [id number] I.MANNING (NOTE) SARS-CoV-2 target nucleic acids are DETECTED. The SARS-CoV-2 RNA is generally detectable in upper and lower respiratory specimens during the acute phase of infection. Positive results are indicative of the presence of SARS-CoV-2 RNA. Clinical correlation with patient history and other diagnostic information is  necessary to determine patient infection status. Positive results do not rule out bacterial infection or co-infection with  other viruses.  The expected result is Negative. Fact Sheet for Patients: SugarRoll.be Fact Sheet for Healthcare Providers: https://www.woods-mathews.com/ This test is not yet approved or cleared by the [place] FDA and  has been authorized for detection and/or  diagnosis of SARS-CoV-2 by FDA under an Emergency Use Authorization (EUA). This EUA will remain  in effect (meaning this test can be used) for the  duration of the COVID-19 declaration under Section 564(b)(1) of the Act, 21 U.S.C. section 360bbb-3(b)(1), unless the authorization is terminated or revoked sooner. Performed at [facility], [place] [address] [place], [place] [address]   SARS Coronavirus 2 Ag (30 min TAT) - Nasal Swab (BD Veritor Kit)     Status: Abnormal   Collection Time: 03/22/19 11:39 PM   Specimen: Nasal Swab (BD Veritor Kit)  Result Value Ref Range Status   SARS Coronavirus 2 Ag POSITIVE (A) NEGATIVE Final    Comment: RESULT CALLED TO, READ BACK BY AND VERIFIED WITH: MAYNARD,C AT 0007 ON [id number] BY CHERESNOWSKY,T (NOTE) SARS-CoV-2 antigen PRESENT. Positive results indicate the presence of viral antigens, but clinical correlation with patient history and other diagnostic information is necessary to determine patient infection status.  Positive results do not rule out bacterial infection or co-infection  with other viruses. False positive results are rare but can occur, and confirmatory RT-PCR testing may be appropriate in some circumstances. The expected result is Negative. Fact Sheet for Patients: PodPark.tn Fact Sheet for Providers: GiftContent.is  This test is not yet approved or cleared by the [place] FDA and  has been authorized for detection and/or diagnosis of SARS-CoV-2 by FDA under an Emergency Use Authorization (EUA).  This EUA will remain in effect (meaning this test can be used) for the duration of  the COVID- 19 declaration under Section 564(b)(1) of the Act, 21 U.S.C. section 360bbb-3(b)(1), unless the authorization is terminated or revoked sooner. Performed at [facility], [address] [place], [place] [address]     Today   Subjective    Marcus Weaver today has  no headache,no chest abdominal pain,no new weakness.   Objective   Blood pressure (!) 123/51, pulse 78, temperature 97.8 F (36.6 C), temperature source Oral, resp. rate 20, height '5\' 10"'$  (1.778 m), weight 90.6 kg, SpO2 93 %.   Intake/Output Summary (Last 24 hours) at 04/01/2019 0909 Last data filed at 04/01/2019 0500 Gross per 24 hour  Intake 1199.63 ml  Output 370 ml  Net 829.63 ml    Exam  Awake but remains confused, no new F.N deficits,   [place].AT,PERRAL Supple Neck,No JVD, No cervical lymphadenopathy appriciated.  Symmetrical Chest wall movement, Good air movement bilaterally, CTAB RRR,No Gallops,Rubs or new Murmurs, No Parasternal Heave +ve B.Sounds, Abd Soft, Non tender, No organomegaly appriciated, No rebound -guarding or rigidity. No Cyanosis, Clubbing or edema, No new Rash or bruise   Data Review   CBC w Diff:  Lab Results  Component Value Date   WBC 8.1 04/01/2019   HGB 9.3 (L) 04/01/2019   HCT 29.0 (L) 04/01/2019   PLT 205 04/01/2019   PLT 200 01/21/2010   LYMPHOPCT 8 04/01/2019   MONOPCT 9 04/01/2019   EOSPCT 1 04/01/2019   BASOPCT 0 04/01/2019    CMP:  Lab Results  Component Value Date   NA 144 04/01/2019   K 4.5 04/01/2019   CL 115 (H) 04/01/2019   CO2 19 (L) 04/01/2019   BUN 86 (H) 04/01/2019   CREATININE 3.70 (H)  04/01/2019   PROT 6.4 (L) 04/01/2019   ALBUMIN 2.4 (L) 04/01/2019   BILITOT 0.8 04/01/2019   ALKPHOS 46 04/01/2019   AST 25 04/01/2019   ALT 21 04/01/2019  .   Total Time in preparing paper work, data evaluation and todays exam - 81 minutes  Lala Lund M.D on 04/01/2019 at 9:09 AM  [facility]   Office  [phone]

## 2019-04-01 NOTE — TOC Transition Note (Signed)
Transition of Care [facility]) - CM/SW Discharge Note   Patient Details  Name: Nathanial Riesen MRN: [id number] Date of Birth: [birth year]  [facility] CM/SW Contact:  Geralynn Ochs, LCSW Phone Number: 04/01/2019, 1:38 PM   Clinical Narrative:   Per Delsa Sale with [facility], bed has been delivered and patient is ready for transport home. Patient requires [facility] transport. CSW called and requested pickup. No further needs at this time.    Final next level of care: Home w Hospice Care Barriers to Discharge: Barriers Resolved   Patient Goals and CMS Choice   CMS Medicare.gov Compare Post Acute Care list provided to:: Patient Represenative (must comment)(Marcia daughter) Choice offered to / list presented to : Adult Children  Discharge Placement                Patient to be transferred to facility by: [facility]      Discharge Plan and Services In-house Referral: Clinical Social Work, Hospice / Palliative Care   Post Acute Care Choice: Hospice                               Social Determinants of Health (SDOH) Interventions     Readmission Risk Interventions No flowsheet data found.

## 2019-04-01 NOTE — Progress Notes (Signed)
Manufacturing engineer Documentation  Liaison notes that pt is discharging today. Pt's DME has arrived and [facility] will set up an admission visit for pt ASAP.   Please dc pt with any new rxs and DNR paperwork.   Thank you for the referral!  Freddie Breech, RN [facility] Liaison [phone]

## 2019-04-02 ENCOUNTER — Telehealth: Payer: Self-pay

## 2019-04-02 LAB — GLUCOSE, CAPILLARY: Glucose-Capillary: 74 mg/dL (ref 70–99)

## 2019-04-02 NOTE — Telephone Encounter (Signed)
Transition Care Management Follow-up Telephone Call   Date discharged?1.10.21   How have you been since you were released from the hospital? Daughter states that patient is about the same from his stay in the hospital.  Do you understand why you were in the hospital? Yes, pneumonia caused by COVID, UTI   Do you understand the discharge instructions? yes   Where were you discharged to? Home with daughter   Items Reviewed:  Medications reviewed: yes  Allergies reviewed: yes  Dietary changes reviewed: yes  Referrals reviewed: yes   Functional Questionnaire:   Activities of Daily Living (ADLs):   He states they are independent in the following: N/A States they require assistance with the following: ambulation, bathing and hygiene, feeding, continence, grooming, toileting and dressing   Any transportation issues/concerns?: no   Any patient concerns? no   Confirmed importance and date/time of follow-up visits scheduled yes  Provider Appointment booked with Annye Asa, MD 1.19.21 @ 11 AM  Confirmed with patient if condition begins to worsen call PCP or go to the ER.  Patient was given the office number and encouraged to call back with question or concerns.  : yes

## 2019-04-05 ENCOUNTER — Ambulatory Visit: Payer: Medicare Other | Admitting: Endocrinology

## 2019-04-10 ENCOUNTER — Inpatient Hospital Stay: Payer: Medicare Other | Admitting: Family Medicine

## 2019-04-12 ENCOUNTER — Telehealth: Payer: Self-pay | Admitting: Family Medicine

## 2019-04-12 ENCOUNTER — Telehealth: Payer: Self-pay | Admitting: *Deleted

## 2019-04-12 NOTE — Telephone Encounter (Signed)
Pt daughter Nacoma Caraveo dropp FMLA  Form place at front office box

## 2019-04-12 NOTE — Telephone Encounter (Signed)
Izora Gala from [facility] called in asking for a verbal order for pt to take in Sorbitol for constipation. She states that he hasn't had a BM for over a wk. Please call Izora Gala at [phone]

## 2019-04-12 NOTE — Telephone Encounter (Signed)
Ok for verbal order  °

## 2019-04-12 NOTE — Telephone Encounter (Signed)
Called and verbal ok given.  

## 2019-04-12 NOTE — Telephone Encounter (Signed)
Please advise 

## 2019-04-12 NOTE — Telephone Encounter (Signed)
Paperwork given to PCP.  

## 2019-04-13 ENCOUNTER — Other Ambulatory Visit: Payer: Self-pay

## 2019-04-13 ENCOUNTER — Ambulatory Visit (INDEPENDENT_AMBULATORY_CARE_PROVIDER_SITE_OTHER): Admitting: Family Medicine

## 2019-04-13 ENCOUNTER — Encounter: Payer: Self-pay | Admitting: Family Medicine

## 2019-04-13 DIAGNOSIS — U071 COVID-19: Secondary | ICD-10-CM

## 2019-04-13 DIAGNOSIS — N179 Acute kidney failure, unspecified: Secondary | ICD-10-CM

## 2019-04-13 DIAGNOSIS — J1282 Pneumonia due to coronavirus disease 2019: Secondary | ICD-10-CM

## 2019-04-13 DIAGNOSIS — G9340 Encephalopathy, unspecified: Secondary | ICD-10-CM

## 2019-04-13 NOTE — Progress Notes (Signed)
I have discussed the procedure for the virtual visit with the patient who has given consent to proceed with assessment and treatment.   Pt family unable to obtain vitals.   Davis Gourd, CMA

## 2019-04-13 NOTE — Progress Notes (Signed)
Virtual Visit via Video   I connected with patient on 04/13/19 at 11:00 AM EST by a video enabled telemedicine application and verified that I am speaking with the correct person using two identifiers.  Location patient: Home Location provider: [facility]up[facility], Office Persons participating in the virtual visit: Patient, Provider, [place] (Jess B)  I discussed the limitations of evaluation and management by telemedicine and the availability of in person appointments. The patient expressed understanding and agreed to proceed.  Subjective:   HPI:   Hospital f/u- pt was hospitalized 12/31-1/10 w/ DKA and COVID PNA.  COVID sxs were mild during his hospitalization but he struggled w/ encephalopathy.  He was treated w/ Seroquel and Haldol and often required restraints.  Family felt he would do better at home so he was d/c'd w/ Hospice care.  His AKI improved during his hospital stay w/ continued IV fluids.  Lisinopril is held.  D/C summary recommends CBC, CMP, CXR.  Family reports some improvement since d/c- eating and drinking, 'a little bit more alert'.  Started using O2 via [place] 2 days after returning home.  Confusion is better.  Family reports Hospice is working with them.  He is bed bound.  Has not had BM in 2 weeks.  Had enema and started Sorbitol.    H&P, D/C Summary, labs, med list, imaging reviewed  ROS:   See pertinent positives and negatives per HPI.  Patient Active Problem List   Diagnosis Date Noted  . Palliative care encounter   . Pressure injury of skin 03/24/2019  . Pneumonia due to COVID-19 virus 03/23/2019  . Hyperosmolar non-ketotic state due to type 2 diabetes mellitus ([place]) 03/23/2019  . Acute encephalopathy 03/22/2019  . Essential hypertension 10/13/2015  . Hypertensive heart disease 08/14/2015  . Abnormal EKG 08/14/2015  . Diabetes ([place]) 07/12/2015  . Tachycardia 07/11/2015  . CKD (chronic kidney disease) stage 4, GFR 15-29 ml/min ([facility]) 05/18/2015  . Dementia  ([place]) 05/15/2015  . Acute kidney injury ([place])   . Numbness in both hands 05/05/2015  . Polyuria 01/02/2015  . Broken teeth 06/01/2013  . Physical exam, annual 11/05/2011  . Hyperlipidemia associated with type 2 diabetes mellitus ([place]) 02/10/2010  . TOBACCO ABUSE 01/24/2007  . GLAUCOMA 01/24/2007  . CATARACTS 01/24/2007  . CONSTIPATION 01/24/2007    Social History   Tobacco Use  . Smoking status: Former Smoker    Types: Cigarettes    Quit date: 06/25/2015    Years since quitting: 3.8  . Smokeless tobacco: Never Used  . Tobacco comment: smokes 3 or 4 cigarettes /day  Substance Use Topics  . Alcohol use: No    Current Outpatient Medications:  .  acetaminophen (TYLENOL) 325 MG tablet, Take 325-650 mg by mouth every 6 (six) hours as needed (for pain.)., Disp: , Rfl:  .  amLODipine (NORVASC) 5 MG tablet, Take 1 tablet (5 mg total) by mouth daily. Please make an overdue appt with Dr. Radford Pax before anymore refills. 3rd and Final, Disp: 15 tablet, Rfl: 0 .  carvedilol (COREG) 12.5 MG tablet, Take 12.5 mg by mouth 2 (two) times daily., Disp: , Rfl:  .  furosemide (LASIX) 80 MG tablet, Take 80-160 mg by mouth See admin instructions. Take 2 tablets (160 mg) by mouth in the morning & take 1 tablet (80 mg) by mouth at noon., Disp: , Rfl: 10 .  gentamicin cream (GARAMYCIN) 0.1 %, Apply 1 application topically 2 (two) times daily. (Patient taking differently: Apply 1 application topically 2 (two)  times daily as needed (skin irritation (foot area(s))). ), Disp: 30 g, Rfl: 2 .  glucose blood (ONETOUCH VERIO) test strip, 1 each by Other route 2 (two) times daily. E11.9, Disp: 100 each, Rfl: 2 .  haloperidol (HALDOL) 2 MG tablet, TAKE 1 TABLET BY MOUTH EVERY 4 HOURS AROUND THE CLOCK., Disp: , Rfl:  .  Insulin NPH, Human,, Isophane, (HUMULIN N KWIKPEN) 100 UNIT/ML Kiwkpen, Inject 60 Units into the skin every morning. And pen needles 1/day (Patient taking differently: Inject 60 Units into the skin daily.  And pen needles 1/day), Disp: 10 pen, Rfl: 11 .  LORazepam (ATIVAN) 1 MG tablet, Take 1 mg by mouth every 4 (four) hours as needed., Disp: , Rfl:  .  QUEtiapine (SEROQUEL) 25 MG tablet, TAKE 1 TABLET(25 MG) BY MOUTH AT BEDTIME (Patient taking differently: Take 25 mg by mouth at bedtime. ), Disp: 30 tablet, Rfl: 6 .  tamsulosin (FLOMAX) 0.4 MG CAPS capsule, Take 0.4 mg by mouth daily., Disp: , Rfl:  .  calcitRIOL (ROCALTROL) 0.25 MCG capsule, Take 0.25 mcg by mouth daily. , Disp: , Rfl:   No Known Allergies  Objective:   There were no vitals taken for this visit. Awake, alert, oriented to self, NAD NCAT,  No obvious CN deficits Coloring WNL Pt is able to speak clearly, coherently without shortness of breath or increased work of breathing.  Thought process is linear.  Mood is appropriate.   Assessment and Plan:   PNA due to COVID- pt is now on O2 via [place].  Family reports this keeps him comfortable and denies any increased WOB or SOB.  Acute encephalopathy- pt is no longer acutely encephalopathic but he is not oriented nor able to respond to questions appropriately.  Family reports confusion is less than when he was hospitalized.  He is now in home Hospice.  AKI- Lisinopril is still being held and his renal fxn was improving w/ IVF while hospitalized.  His d/c summary recommended f/u labs and imaging but due to his inability to get out of bed or transport, family doesn't feel this is possible.  They want to forgo labs and CXR at this time and continue to follow w/ home Hospice.   Annye Asa, MD 04/13/2019

## 2019-04-16 NOTE — Telephone Encounter (Signed)
Please call pt when FMLA form is done

## 2019-04-18 NOTE — Telephone Encounter (Signed)
FYI

## 2019-04-18 NOTE — Telephone Encounter (Signed)
Pt daughter called again, states that the deadline for this ppwk with her employer is tomorrow. I advised her we would call when it was ready.

## 2019-04-19 DIAGNOSIS — Z0279 Encounter for issue of other medical certificate: Secondary | ICD-10-CM

## 2019-04-19 NOTE — Telephone Encounter (Signed)
Form completed and given to front desk

## 2019-04-19 NOTE — Telephone Encounter (Signed)
Made Daughter aware the FMLA forms were completed she states she has has to add more forms and will pick them up today. They are placed in the pick up bin up front.

## 2019-04-30 ENCOUNTER — Telehealth: Payer: Self-pay | Admitting: Family Medicine

## 2019-04-30 NOTE — Telephone Encounter (Signed)
Marcus Weaver from [facility] 707 [id number] Pt has been on condom cath since mid January hospital release. Daughters took good care per nurse, but the past couple of days his penis is swollen and he has clear gel-like discharge. They removed the condom cath 1 day ago and are using briefs, but no improvement in his symptoms. Should they be doing something else?  Also, pt daughters requesting Tramadol for overall pain and pain caused by skin breakdowns per Marcus Weaver.

## 2019-04-30 NOTE — Telephone Encounter (Signed)
Called and spoke with Izora Gala. She is going to have Hospice provider go to pt home tomorrow to evaluate patient. and will also ask them for pain meds.

## 2019-04-30 NOTE — Telephone Encounter (Signed)
Is the Hospice provider able to evaluate pt?  It is hard to treat based on that description.  Also, I have concerns about 'skin breakdowns' as noted in the message.  I know the family is not able to get him out of the house for appts so in home evaluation would be the best option.  And Hospice already has pain management standing orders to implement when needed.

## 2019-04-30 NOTE — Telephone Encounter (Signed)
Please advise 

## 2019-05-02 ENCOUNTER — Ambulatory Visit: Payer: Medicare Other | Admitting: Podiatry

## 2019-05-16 ENCOUNTER — Telehealth: Payer: Self-pay | Admitting: *Deleted

## 2019-05-16 NOTE — Telephone Encounter (Signed)
Called and spoke with pt daughter Bee. She said that Mr. Polinsky was unable to truly eat or drink fluids. He had been doing ok on Boost but the last few days he went down hill fast.

## 2019-05-16 NOTE — Telephone Encounter (Signed)
Fax received From [employer]    Patient Expired on 06/14/19 At 07:49pm

## 2019-05-16 NOTE — Telephone Encounter (Signed)
Please express my sympathy to the family

## 2019-05-21 ENCOUNTER — Telehealth: Payer: Self-pay | Admitting: Family Medicine

## 2019-05-21 NOTE — Telephone Encounter (Signed)
I have placed a death Certificate in the bin up front. Please fax to [phone] when complete and call [phone] to make them aware it is ready to pick up.

## 2019-05-21 NOTE — Telephone Encounter (Signed)
Papers given to PCP

## 2019-05-21 NOTE — Telephone Encounter (Signed)
fyi

## 2019-05-21 NOTE — Telephone Encounter (Signed)
Pick up from the back faxed and made [facility] [facility] aware

## 2019-05-21 NOTE — Telephone Encounter (Signed)
Form completed and placed in basket  

## 2019-05-21 DEATH — deceased

## 2020-06-15 IMAGING — CT CT HEAD W/O CM
3 series · 15 of 47 positions shown, 18 images · non-contrast
Comparison: CT brain 05/15/2015

CLINICAL DATA: Altered mental status

EXAM:
CT HEAD WITHOUT CONTRAST
TECHNIQUE: Contiguous axial images were obtained from the base of the skull
through the vertex without intravenous contrast.

[Series 2: head wo · axial · 0.42mm/px · z∈[+1363,+1488]mm · 9 of 30 slices shown, 12 images]
[im 3/30  brain]
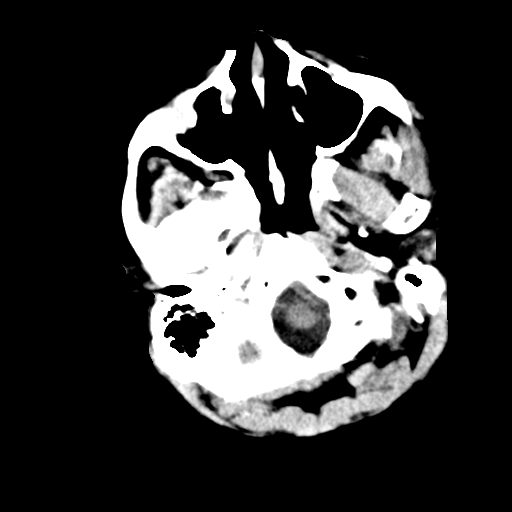
[im 3/30  bone]
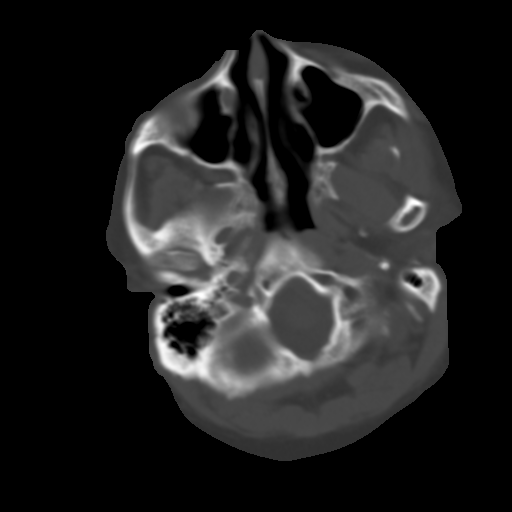
[im 6/30  brain]
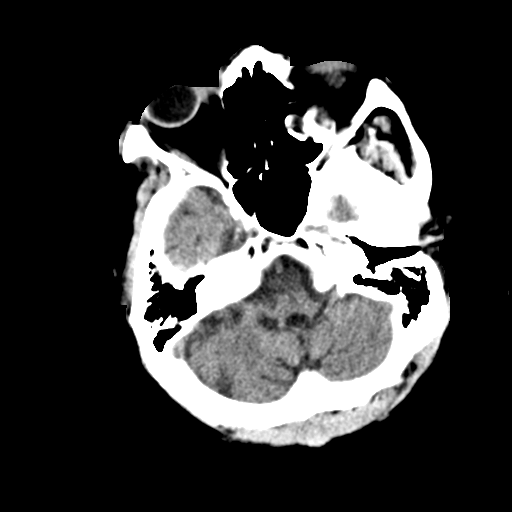
[im 9/30  brain]
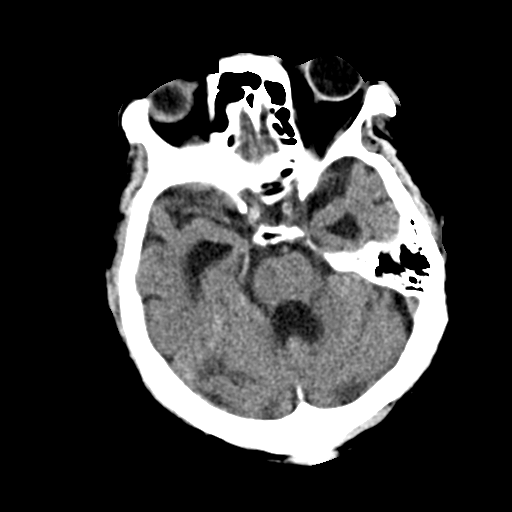
[im 12/30  brain]
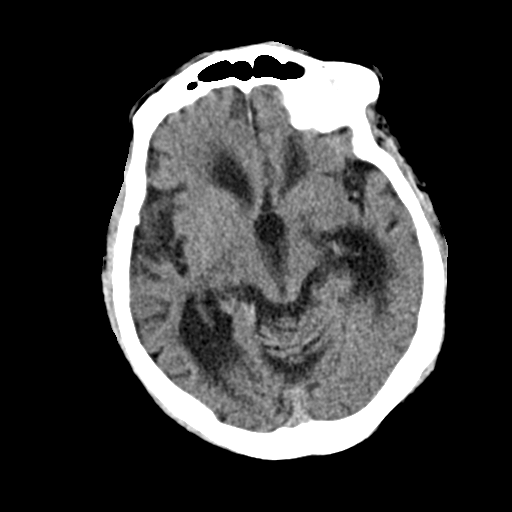
[im 16/30  brain]
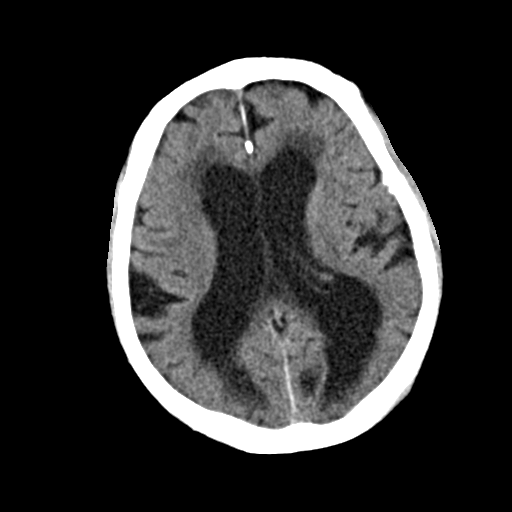
[im 16/30  bone]
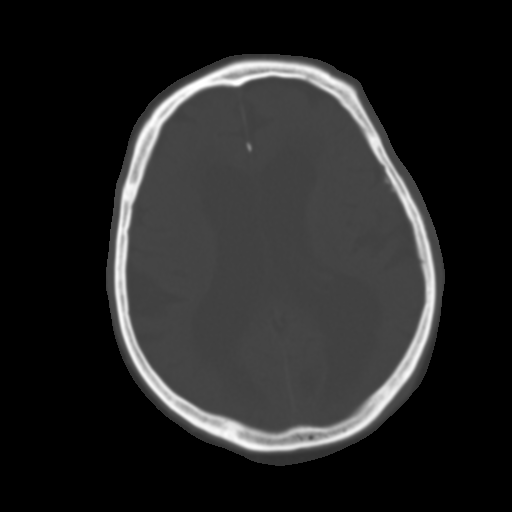
[im 19/30  brain]
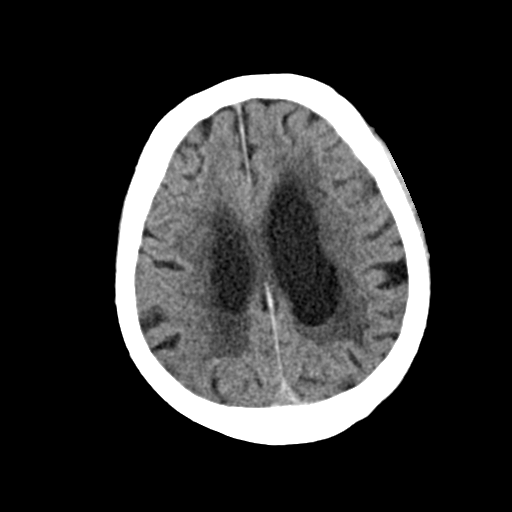
[im 22/30  brain]
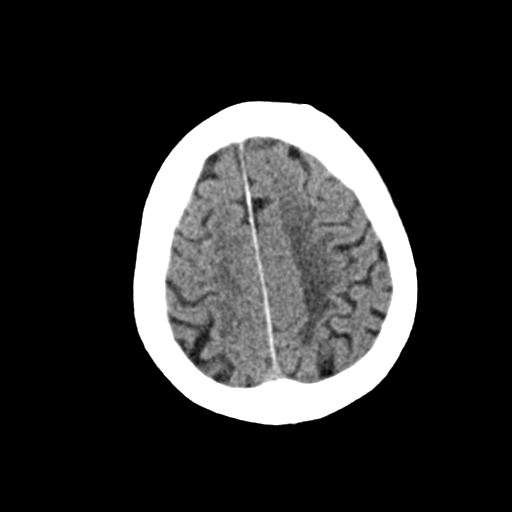
[im 25/30  brain]
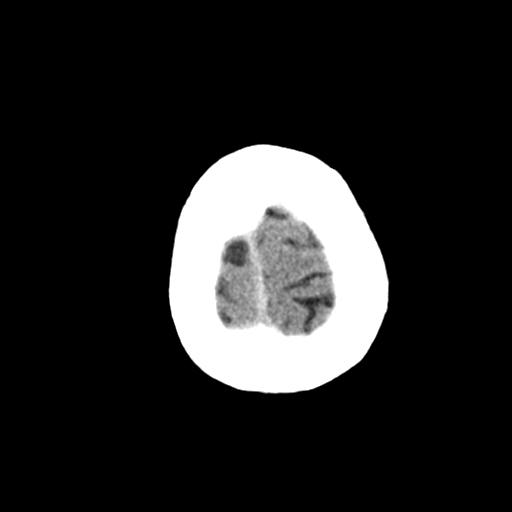
[im 28/30  brain]
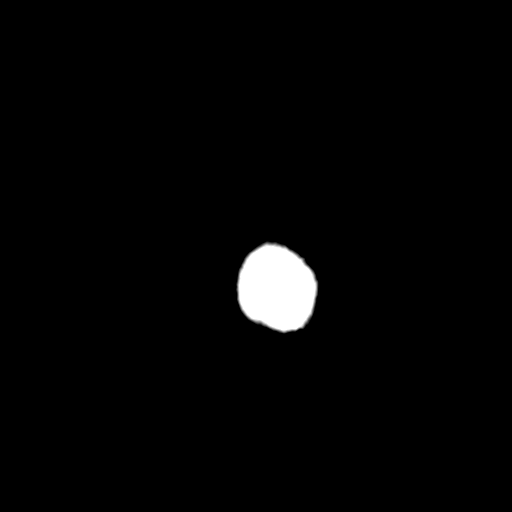
[im 28/30  bone]
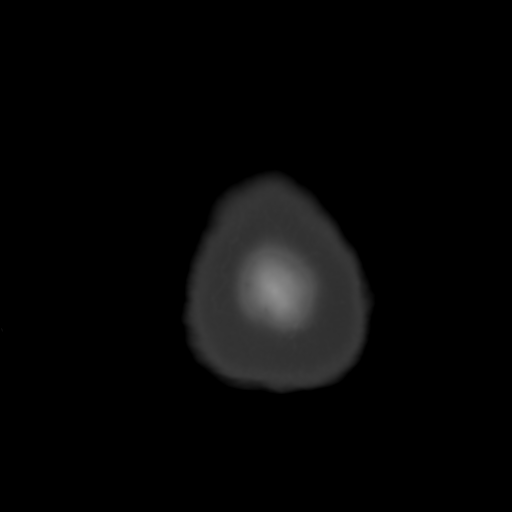

[Series 4: coronal soft · coronal · 0.29mm/px · 3 of 67 slices shown]
[im 23/67  brain]
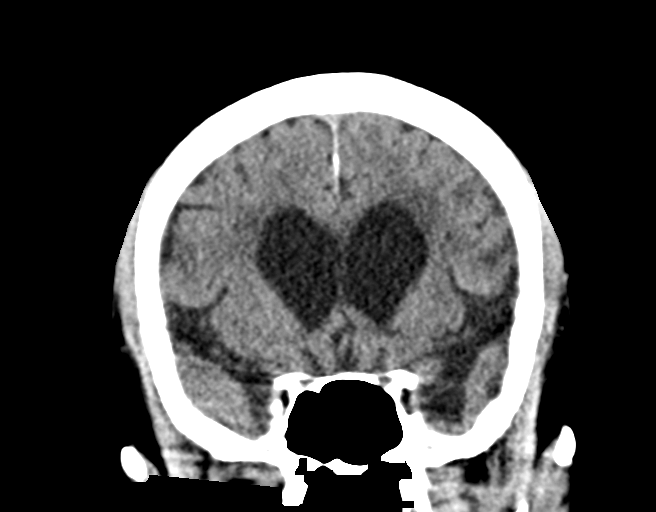
[im 30/67  brain]
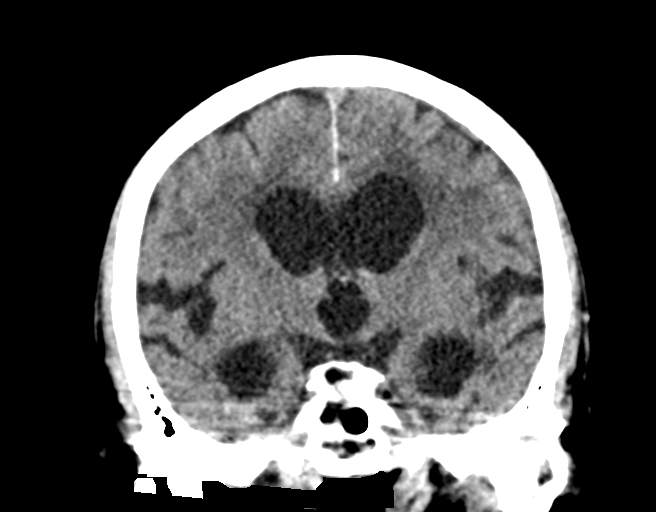
[im 37/67  brain]
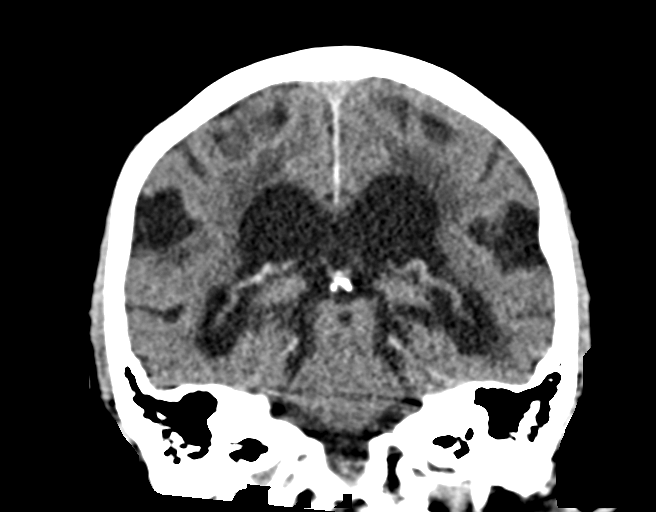

[Series 5: sag soft · sagittal · 0.29mm/px · 3 of 64 slices shown]
[im 22/64  brain]
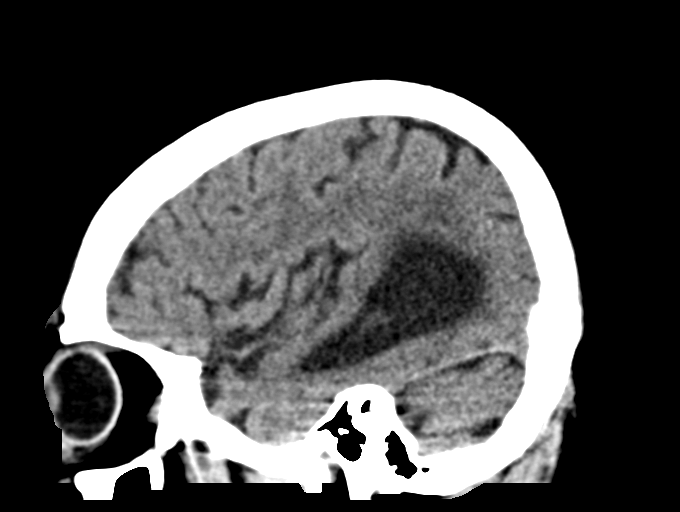
[im 32/64  brain]
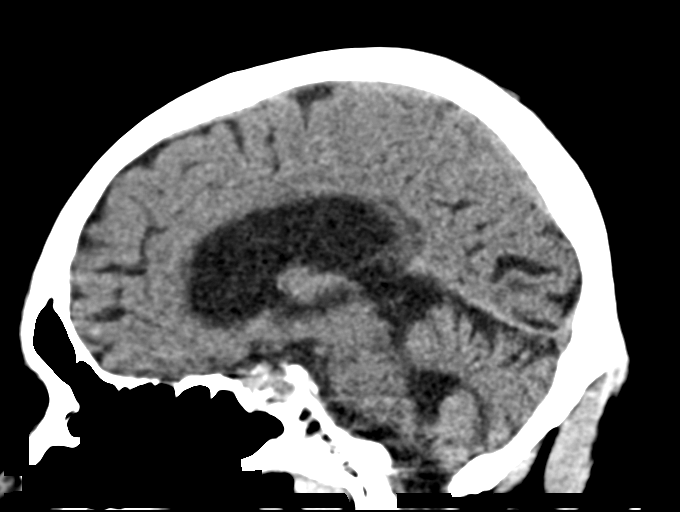
[im 43/64  brain]
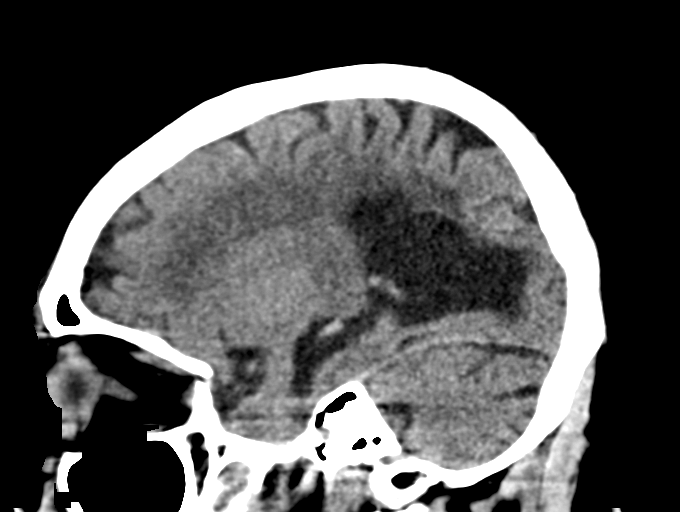

[15 of 47 positions shown; findings below may reference images not displayed]

FINDINGS: Brain: No acute territorial infarction, hemorrhage, or intracranial
mass is visualized. Moderate atrophy, slightly progressed since
8283. Moderate hypodensity in the white matter, likely chronic small
vessel ischemic change. Moderate enlargement of the ventricles,
slightly progressed since 8283.

Vascular: No hyperdense vessels.  Carotid vascular calcification

Skull: Normal. Negative for fracture or focal lesion.

Sinuses/Orbits: Mild mucosal thickening in the sinuses. Old
fractures of the medial walls of both orbits

Other: None
IMPRESSION: 1. No definite CT evidence for acute intracranial abnormality.
2. Moderate ventricular enlargement, slightly increased since 8283,
likely related to progression of atrophy.
3. Moderate hypodensity in the white matter consistent with chronic
small vessel ischemic change
